# Patient Record
Sex: Female | Born: 1955
Health system: Southern US, Community
[De-identification: ages and names within clinical notes are randomized; demographics above are authoritative.]

## PROBLEM LIST (undated history)

## (undated) DIAGNOSIS — T4145XA Adverse effect of unspecified anesthetic, initial encounter: Secondary | ICD-10-CM

## (undated) DIAGNOSIS — F419 Anxiety disorder, unspecified: Secondary | ICD-10-CM

## (undated) DIAGNOSIS — F32A Depression, unspecified: Secondary | ICD-10-CM

## (undated) DIAGNOSIS — G4733 Obstructive sleep apnea (adult) (pediatric): Secondary | ICD-10-CM

## (undated) DIAGNOSIS — R7303 Prediabetes: Secondary | ICD-10-CM

## (undated) DIAGNOSIS — J45909 Unspecified asthma, uncomplicated: Secondary | ICD-10-CM

## (undated) DIAGNOSIS — G4730 Sleep apnea, unspecified: Secondary | ICD-10-CM

## (undated) DIAGNOSIS — N63 Unspecified lump in unspecified breast: Secondary | ICD-10-CM

## (undated) DIAGNOSIS — I10 Essential (primary) hypertension: Secondary | ICD-10-CM

## (undated) DIAGNOSIS — F329 Major depressive disorder, single episode, unspecified: Secondary | ICD-10-CM

## (undated) DIAGNOSIS — I5022 Chronic systolic (congestive) heart failure: Secondary | ICD-10-CM

## (undated) DIAGNOSIS — N189 Chronic kidney disease, unspecified: Secondary | ICD-10-CM

## (undated) DIAGNOSIS — M1990 Unspecified osteoarthritis, unspecified site: Secondary | ICD-10-CM

## (undated) DIAGNOSIS — M109 Gout, unspecified: Principal | ICD-10-CM

## (undated) DIAGNOSIS — E785 Hyperlipidemia, unspecified: Secondary | ICD-10-CM

## (undated) DIAGNOSIS — T8859XA Other complications of anesthesia, initial encounter: Secondary | ICD-10-CM

## (undated) DIAGNOSIS — E119 Type 2 diabetes mellitus without complications: Secondary | ICD-10-CM

## (undated) HISTORY — DX: Unspecified lump in unspecified breast: N63.0

## (undated) HISTORY — DX: Gout, unspecified: M10.9

## (undated) HISTORY — DX: Sleep apnea, unspecified: G47.30

---

## 1997-01-20 HISTORY — PX: ABDOMINAL HYSTERECTOMY: SHX81

## 1998-05-25 ENCOUNTER — Encounter: Payer: Self-pay | Admitting: Internal Medicine

## 1998-05-25 ENCOUNTER — Inpatient Hospital Stay (HOSPITAL_COMMUNITY): Admission: AD | Admit: 1998-05-25 | Discharge: 1998-05-27 | Payer: Self-pay | Admitting: Internal Medicine

## 1998-05-27 ENCOUNTER — Encounter: Payer: Self-pay | Admitting: Internal Medicine

## 2002-02-02 ENCOUNTER — Encounter: Admission: RE | Admit: 2002-02-02 | Discharge: 2002-02-02 | Payer: Self-pay | Admitting: Internal Medicine

## 2002-02-02 ENCOUNTER — Encounter: Payer: Self-pay | Admitting: Internal Medicine

## 2002-12-16 ENCOUNTER — Encounter: Admission: RE | Admit: 2002-12-16 | Discharge: 2002-12-16 | Payer: Self-pay | Admitting: Internal Medicine

## 2003-12-29 ENCOUNTER — Other Ambulatory Visit: Admission: RE | Admit: 2003-12-29 | Discharge: 2003-12-29 | Payer: Self-pay | Admitting: Obstetrics and Gynecology

## 2004-10-15 ENCOUNTER — Emergency Department (HOSPITAL_COMMUNITY): Admission: EM | Admit: 2004-10-15 | Discharge: 2004-10-15 | Payer: Self-pay | Admitting: Emergency Medicine

## 2006-05-25 ENCOUNTER — Ambulatory Visit: Payer: Self-pay | Admitting: Cardiology

## 2006-05-27 ENCOUNTER — Ambulatory Visit: Payer: Self-pay | Admitting: Cardiology

## 2006-08-09 ENCOUNTER — Ambulatory Visit (HOSPITAL_BASED_OUTPATIENT_CLINIC_OR_DEPARTMENT_OTHER): Admission: RE | Admit: 2006-08-09 | Discharge: 2006-08-09 | Payer: Self-pay | Admitting: Cardiology

## 2006-08-25 ENCOUNTER — Ambulatory Visit: Payer: Self-pay | Admitting: Pulmonary Disease

## 2007-12-02 ENCOUNTER — Ambulatory Visit: Payer: Self-pay | Admitting: Cardiology

## 2008-03-23 ENCOUNTER — Emergency Department (HOSPITAL_COMMUNITY): Admission: EM | Admit: 2008-03-23 | Discharge: 2008-03-23 | Payer: Self-pay | Admitting: Emergency Medicine

## 2008-04-19 ENCOUNTER — Ambulatory Visit: Payer: Self-pay

## 2008-12-22 ENCOUNTER — Encounter: Payer: Self-pay | Admitting: Cardiology

## 2009-02-20 ENCOUNTER — Ambulatory Visit: Payer: Self-pay | Admitting: Vascular Surgery

## 2009-02-20 ENCOUNTER — Emergency Department (HOSPITAL_COMMUNITY): Admission: EM | Admit: 2009-02-20 | Discharge: 2009-02-21 | Payer: Self-pay | Admitting: Emergency Medicine

## 2009-02-20 ENCOUNTER — Encounter (INDEPENDENT_AMBULATORY_CARE_PROVIDER_SITE_OTHER): Payer: Self-pay | Admitting: Emergency Medicine

## 2009-02-23 ENCOUNTER — Encounter (INDEPENDENT_AMBULATORY_CARE_PROVIDER_SITE_OTHER): Payer: Self-pay | Admitting: Internal Medicine

## 2009-02-23 ENCOUNTER — Inpatient Hospital Stay (HOSPITAL_COMMUNITY): Admission: EM | Admit: 2009-02-23 | Discharge: 2009-02-27 | Payer: Self-pay | Admitting: Emergency Medicine

## 2009-02-23 ENCOUNTER — Ambulatory Visit: Payer: Self-pay | Admitting: Vascular Surgery

## 2009-04-19 ENCOUNTER — Ambulatory Visit: Payer: Self-pay | Admitting: Cardiology

## 2009-04-19 ENCOUNTER — Ambulatory Visit: Payer: Self-pay | Admitting: Family Medicine

## 2009-04-19 ENCOUNTER — Inpatient Hospital Stay (HOSPITAL_COMMUNITY): Admission: EM | Admit: 2009-04-19 | Discharge: 2009-04-26 | Payer: Self-pay | Admitting: Emergency Medicine

## 2009-04-23 ENCOUNTER — Encounter: Payer: Self-pay | Admitting: Family Medicine

## 2009-05-12 ENCOUNTER — Observation Stay (HOSPITAL_COMMUNITY): Admission: EM | Admit: 2009-05-12 | Discharge: 2009-05-16 | Payer: Self-pay | Admitting: Emergency Medicine

## 2009-05-30 ENCOUNTER — Encounter: Admission: RE | Admit: 2009-05-30 | Discharge: 2009-05-30 | Payer: Self-pay | Admitting: Rheumatology

## 2010-02-11 ENCOUNTER — Encounter: Payer: Self-pay | Admitting: Sports Medicine

## 2010-04-09 LAB — CBC
HCT: 32.7 % — ABNORMAL LOW (ref 36.0–46.0)
HCT: 34.4 % — ABNORMAL LOW (ref 36.0–46.0)
HCT: 37 % (ref 36.0–46.0)
Hemoglobin: 11.3 g/dL — ABNORMAL LOW (ref 12.0–15.0)
Hemoglobin: 12.2 g/dL (ref 12.0–15.0)
MCHC: 32.8 g/dL (ref 30.0–36.0)
MCHC: 32.9 g/dL (ref 30.0–36.0)
MCHC: 33.1 g/dL (ref 30.0–36.0)
MCV: 84.4 fL (ref 78.0–100.0)
MCV: 84.5 fL (ref 78.0–100.0)
MCV: 84.6 fL (ref 78.0–100.0)
MCV: 85 fL (ref 78.0–100.0)
MCV: 85 fL (ref 78.0–100.0)
Platelets: 138 10*3/uL — ABNORMAL LOW (ref 150–400)
Platelets: 183 10*3/uL (ref 150–400)
Platelets: 192 10*3/uL (ref 150–400)
RBC: 3.87 MIL/uL (ref 3.87–5.11)
RBC: 4.07 MIL/uL (ref 3.87–5.11)
WBC: 12.6 10*3/uL — ABNORMAL HIGH (ref 4.0–10.5)
WBC: 13.6 10*3/uL — ABNORMAL HIGH (ref 4.0–10.5)
WBC: 18.4 10*3/uL — ABNORMAL HIGH (ref 4.0–10.5)

## 2010-04-09 LAB — GLUCOSE, CAPILLARY
Glucose-Capillary: 133 mg/dL — ABNORMAL HIGH (ref 70–99)
Glucose-Capillary: 144 mg/dL — ABNORMAL HIGH (ref 70–99)
Glucose-Capillary: 145 mg/dL — ABNORMAL HIGH (ref 70–99)
Glucose-Capillary: 150 mg/dL — ABNORMAL HIGH (ref 70–99)
Glucose-Capillary: 159 mg/dL — ABNORMAL HIGH (ref 70–99)
Glucose-Capillary: 179 mg/dL — ABNORMAL HIGH (ref 70–99)
Glucose-Capillary: 190 mg/dL — ABNORMAL HIGH (ref 70–99)
Glucose-Capillary: 222 mg/dL — ABNORMAL HIGH (ref 70–99)
Glucose-Capillary: 242 mg/dL — ABNORMAL HIGH (ref 70–99)

## 2010-04-09 LAB — COMPREHENSIVE METABOLIC PANEL
AST: 16 U/L (ref 0–37)
AST: 17 U/L (ref 0–37)
AST: 32 U/L (ref 0–37)
AST: 66 U/L — ABNORMAL HIGH (ref 0–37)
Albumin: 2.5 g/dL — ABNORMAL LOW (ref 3.5–5.2)
Albumin: 2.9 g/dL — ABNORMAL LOW (ref 3.5–5.2)
Alkaline Phosphatase: 82 U/L (ref 39–117)
BUN: 27 mg/dL — ABNORMAL HIGH (ref 6–23)
BUN: 45 mg/dL — ABNORMAL HIGH (ref 6–23)
CO2: 21 mEq/L (ref 19–32)
CO2: 22 mEq/L (ref 19–32)
Calcium: 9.1 mg/dL (ref 8.4–10.5)
Calcium: 9.2 mg/dL (ref 8.4–10.5)
Calcium: 9.4 mg/dL (ref 8.4–10.5)
Chloride: 107 mEq/L (ref 96–112)
Chloride: 109 mEq/L (ref 96–112)
Chloride: 112 mEq/L (ref 96–112)
Creatinine, Ser: 1.68 mg/dL — ABNORMAL HIGH (ref 0.4–1.2)
Creatinine, Ser: 1.79 mg/dL — ABNORMAL HIGH (ref 0.4–1.2)
Creatinine, Ser: 1.88 mg/dL — ABNORMAL HIGH (ref 0.4–1.2)
GFR calc Af Amer: 34 mL/min — ABNORMAL LOW (ref >60)
GFR calc Af Amer: 36 mL/min — ABNORMAL LOW (ref >60)
GFR calc Af Amer: 39 mL/min — ABNORMAL LOW (ref >60)
GFR calc Af Amer: 41 mL/min — ABNORMAL LOW (ref >60)
GFR calc non Af Amer: 32 mL/min — ABNORMAL LOW (ref >60)
Potassium: 4.9 mEq/L (ref 3.5–5.1)
Sodium: 139 mEq/L (ref 135–145)
Total Bilirubin: 0.7 mg/dL (ref 0.3–1.2)
Total Protein: 7.1 g/dL (ref 6.0–8.3)
Total Protein: 7.2 g/dL (ref 6.0–8.3)

## 2010-04-09 LAB — BASIC METABOLIC PANEL
Chloride: 110 mEq/L (ref 96–112)
Creatinine, Ser: 1.61 mg/dL — ABNORMAL HIGH (ref 0.4–1.2)
GFR calc non Af Amer: 34 mL/min — ABNORMAL LOW (ref >60)
Glucose, Bld: 178 mg/dL — ABNORMAL HIGH (ref 70–99)
Sodium: 140 mEq/L (ref 135–145)

## 2010-04-09 LAB — URINALYSIS, ROUTINE W REFLEX MICROSCOPIC
Bilirubin Urine: NEGATIVE
Glucose, UA: NEGATIVE mg/dL
Hgb urine dipstick: NEGATIVE
Ketones, ur: NEGATIVE mg/dL
Urobilinogen, UA: 1 mg/dL (ref 0.0–1.0)
pH: 5 (ref 5.0–8.0)

## 2010-04-09 LAB — BODY FLUID CULTURE: Culture: NO GROWTH

## 2010-04-09 LAB — DIFFERENTIAL
Basophils Absolute: 0 10*3/uL (ref 0.0–0.1)
Eosinophils Relative: 0 % (ref 0–5)
Eosinophils Relative: 0 % (ref 0–5)
Lymphocytes Relative: 11 % — ABNORMAL LOW (ref 12–46)
Lymphocytes Relative: 13 % (ref 12–46)
Lymphocytes Relative: 16 % (ref 12–46)
Lymphs Abs: 1.4 10*3/uL (ref 0.7–4.0)
Lymphs Abs: 1.5 10*3/uL (ref 0.7–4.0)
Lymphs Abs: 2.4 10*3/uL (ref 0.7–4.0)
Monocytes Absolute: 0.4 10*3/uL (ref 0.1–1.0)
Monocytes Relative: 3 % (ref 3–12)
Neutro Abs: 7.7 10*3/uL (ref 1.7–7.7)

## 2010-04-09 LAB — CARDIAC PANEL(CRET KIN+CKTOT+MB+TROPI)
CK, MB: 1.3 ng/mL (ref 0.3–4.0)
Relative Index: INVALID (ref 0.0–2.5)
Total CK: 23 U/L (ref 7–177)
Troponin I: 0.03 ng/mL (ref 0.00–0.06)

## 2010-04-09 LAB — SYNOVIAL CELL COUNT + DIFF, W/ CRYSTALS
Eosinophils-Synovial: 0 % (ref 0–1)
Lymphocytes-Synovial Fld: 0 % (ref 0–20)
Monocyte-Macrophage-Synovial Fluid: 10 % — ABNORMAL LOW (ref 50–90)
Neutrophil, Synovial: 90 % — ABNORMAL HIGH (ref 0–25)
WBC, Synovial: 22610 /mm3 — ABNORMAL HIGH (ref 0–200)

## 2010-04-09 LAB — HEMOGLOBIN A1C
Hgb A1c MFr Bld: 6.3 % — ABNORMAL HIGH (ref <5.7)
Mean Plasma Glucose: 134 mg/dL — ABNORMAL HIGH (ref <117)

## 2010-04-09 LAB — GLUCOSE, SEROUS FLUID: Glucose, Fluid: 81 mg/dL

## 2010-04-09 LAB — MAGNESIUM: Magnesium: 2.3 mg/dL (ref 1.5–2.5)

## 2010-04-10 LAB — CK
Total CK: 289 U/L — ABNORMAL HIGH (ref 7–177)
Total CK: 389 U/L — ABNORMAL HIGH (ref 7–177)
Total CK: 637 U/L — ABNORMAL HIGH (ref 7–177)

## 2010-04-10 LAB — GLUCOSE, CAPILLARY
Glucose-Capillary: 116 mg/dL — ABNORMAL HIGH (ref 70–99)
Glucose-Capillary: 117 mg/dL — ABNORMAL HIGH (ref 70–99)
Glucose-Capillary: 120 mg/dL — ABNORMAL HIGH (ref 70–99)
Glucose-Capillary: 122 mg/dL — ABNORMAL HIGH (ref 70–99)
Glucose-Capillary: 122 mg/dL — ABNORMAL HIGH (ref 70–99)
Glucose-Capillary: 124 mg/dL — ABNORMAL HIGH (ref 70–99)
Glucose-Capillary: 130 mg/dL — ABNORMAL HIGH (ref 70–99)
Glucose-Capillary: 133 mg/dL — ABNORMAL HIGH (ref 70–99)
Glucose-Capillary: 137 mg/dL — ABNORMAL HIGH (ref 70–99)
Glucose-Capillary: 137 mg/dL — ABNORMAL HIGH (ref 70–99)
Glucose-Capillary: 141 mg/dL — ABNORMAL HIGH (ref 70–99)
Glucose-Capillary: 143 mg/dL — ABNORMAL HIGH (ref 70–99)
Glucose-Capillary: 153 mg/dL — ABNORMAL HIGH (ref 70–99)
Glucose-Capillary: 173 mg/dL — ABNORMAL HIGH (ref 70–99)
Glucose-Capillary: 178 mg/dL — ABNORMAL HIGH (ref 70–99)
Glucose-Capillary: 181 mg/dL — ABNORMAL HIGH (ref 70–99)
Glucose-Capillary: 182 mg/dL — ABNORMAL HIGH (ref 70–99)
Glucose-Capillary: 185 mg/dL — ABNORMAL HIGH (ref 70–99)
Glucose-Capillary: 206 mg/dL — ABNORMAL HIGH (ref 70–99)
Glucose-Capillary: 99 mg/dL (ref 70–99)

## 2010-04-10 LAB — BASIC METABOLIC PANEL
BUN: 22 mg/dL (ref 6–23)
BUN: 25 mg/dL — ABNORMAL HIGH (ref 6–23)
BUN: 25 mg/dL — ABNORMAL HIGH (ref 6–23)
BUN: 27 mg/dL — ABNORMAL HIGH (ref 6–23)
CO2: 18 mEq/L — ABNORMAL LOW (ref 19–32)
CO2: 24 mEq/L (ref 19–32)
Calcium: 8.4 mg/dL (ref 8.4–10.5)
Calcium: 8.4 mg/dL (ref 8.4–10.5)
Calcium: 8.8 mg/dL (ref 8.4–10.5)
Chloride: 104 mEq/L (ref 96–112)
Chloride: 105 mEq/L (ref 96–112)
Chloride: 107 mEq/L (ref 96–112)
Creatinine, Ser: 1.11 mg/dL (ref 0.4–1.2)
Creatinine, Ser: 1.67 mg/dL — ABNORMAL HIGH (ref 0.4–1.2)
Creatinine, Ser: 1.91 mg/dL — ABNORMAL HIGH (ref 0.4–1.2)
GFR calc Af Amer: 34 mL/min — ABNORMAL LOW (ref >60)
GFR calc Af Amer: 36 mL/min — ABNORMAL LOW (ref >60)
GFR calc Af Amer: 39 mL/min — ABNORMAL LOW (ref >60)
GFR calc non Af Amer: 28 mL/min — ABNORMAL LOW (ref >60)
GFR calc non Af Amer: 28 mL/min — ABNORMAL LOW (ref >60)
GFR calc non Af Amer: 32 mL/min — ABNORMAL LOW (ref >60)
GFR calc non Af Amer: 32 mL/min — ABNORMAL LOW (ref >60)
Glucose, Bld: 77 mg/dL (ref 70–99)
Potassium: 3.3 mEq/L — ABNORMAL LOW (ref 3.5–5.1)
Potassium: 3.6 mEq/L (ref 3.5–5.1)
Potassium: 3.7 mEq/L (ref 3.5–5.1)
Sodium: 136 mEq/L (ref 135–145)
Sodium: 139 mEq/L (ref 135–145)
Sodium: 141 mEq/L (ref 135–145)

## 2010-04-10 LAB — CBC
HCT: 30.8 % — ABNORMAL LOW (ref 36.0–46.0)
HCT: 31 % — ABNORMAL LOW (ref 36.0–46.0)
HCT: 33.4 % — ABNORMAL LOW (ref 36.0–46.0)
HCT: 33.5 % — ABNORMAL LOW (ref 36.0–46.0)
HCT: 34.3 % — ABNORMAL LOW (ref 36.0–46.0)
HCT: 35.1 % — ABNORMAL LOW (ref 36.0–46.0)
HCT: 36.8 % (ref 36.0–46.0)
Hemoglobin: 10.3 g/dL — ABNORMAL LOW (ref 12.0–15.0)
Hemoglobin: 10.3 g/dL — ABNORMAL LOW (ref 12.0–15.0)
Hemoglobin: 11.4 g/dL — ABNORMAL LOW (ref 12.0–15.0)
Hemoglobin: 11.4 g/dL — ABNORMAL LOW (ref 12.0–15.0)
Hemoglobin: 11.4 g/dL — ABNORMAL LOW (ref 12.0–15.0)
Hemoglobin: 11.7 g/dL — ABNORMAL LOW (ref 12.0–15.0)
MCHC: 33.3 g/dL (ref 30.0–36.0)
MCHC: 33.8 g/dL (ref 30.0–36.0)
MCHC: 34.2 g/dL (ref 30.0–36.0)
MCV: 82.1 fL (ref 78.0–100.0)
MCV: 82.7 fL (ref 78.0–100.0)
MCV: 82.8 fL (ref 78.0–100.0)
MCV: 83.6 fL (ref 78.0–100.0)
MCV: 83.8 fL (ref 78.0–100.0)
MCV: 84.7 fL (ref 78.0–100.0)
Platelets: 176 10*3/uL (ref 150–400)
Platelets: 243 10*3/uL (ref 150–400)
Platelets: 250 10*3/uL (ref 150–400)
Platelets: 254 10*3/uL (ref 150–400)
Platelets: 71 10*3/uL — ABNORMAL LOW (ref 150–400)
RBC: 3.69 MIL/uL — ABNORMAL LOW (ref 3.87–5.11)
RBC: 3.69 MIL/uL — ABNORMAL LOW (ref 3.87–5.11)
RBC: 4.1 MIL/uL (ref 3.87–5.11)
RBC: 4.24 MIL/uL (ref 3.87–5.11)
WBC: 10.3 10*3/uL (ref 4.0–10.5)
WBC: 12.2 10*3/uL — ABNORMAL HIGH (ref 4.0–10.5)
WBC: 19.7 10*3/uL — ABNORMAL HIGH (ref 4.0–10.5)
WBC: 19.9 10*3/uL — ABNORMAL HIGH (ref 4.0–10.5)
WBC: 3.8 10*3/uL — ABNORMAL LOW (ref 4.0–10.5)
WBC: 5.2 10*3/uL (ref 4.0–10.5)
WBC: 7.8 10*3/uL (ref 4.0–10.5)
WBC: 9.6 10*3/uL (ref 4.0–10.5)

## 2010-04-10 LAB — CARDIAC PANEL(CRET KIN+CKTOT+MB+TROPI)
CK, MB: 1.2 ng/mL (ref 0.3–4.0)
Relative Index: 0.5 (ref 0.0–2.5)
Relative Index: INVALID (ref 0.0–2.5)
Relative Index: INVALID (ref 0.0–2.5)
Total CK: 30 U/L (ref 7–177)
Total CK: 36 U/L (ref 7–177)
Troponin I: 0.03 ng/mL (ref 0.00–0.06)
Troponin I: 0.05 ng/mL (ref 0.00–0.06)
Troponin I: 0.06 ng/mL (ref 0.00–0.06)
Troponin I: 0.17 ng/mL — ABNORMAL HIGH (ref 0.00–0.06)

## 2010-04-10 LAB — HEPATITIS C ANTIBODY: HCV Ab: NEGATIVE

## 2010-04-10 LAB — COMPREHENSIVE METABOLIC PANEL
ALT: 173 U/L — ABNORMAL HIGH (ref 0–35)
ALT: 74 U/L — ABNORMAL HIGH (ref 0–35)
ALT: 92 U/L — ABNORMAL HIGH (ref 0–35)
AST: 24 U/L (ref 0–37)
AST: 43 U/L — ABNORMAL HIGH (ref 0–37)
AST: 79 U/L — ABNORMAL HIGH (ref 0–37)
Albumin: 2.3 g/dL — ABNORMAL LOW (ref 3.5–5.2)
Albumin: 2.3 g/dL — ABNORMAL LOW (ref 3.5–5.2)
Albumin: 2.3 g/dL — ABNORMAL LOW (ref 3.5–5.2)
Albumin: 2.6 g/dL — ABNORMAL LOW (ref 3.5–5.2)
Alkaline Phosphatase: 56 U/L (ref 39–117)
BUN: 25 mg/dL — ABNORMAL HIGH (ref 6–23)
BUN: 28 mg/dL — ABNORMAL HIGH (ref 6–23)
BUN: 28 mg/dL — ABNORMAL HIGH (ref 6–23)
BUN: 37 mg/dL — ABNORMAL HIGH (ref 6–23)
CO2: 21 mEq/L (ref 19–32)
CO2: 23 mEq/L (ref 19–32)
CO2: 23 mEq/L (ref 19–32)
CO2: 24 mEq/L (ref 19–32)
Calcium: 7.3 mg/dL — ABNORMAL LOW (ref 8.4–10.5)
Calcium: 8.6 mg/dL (ref 8.4–10.5)
Calcium: 8.8 mg/dL (ref 8.4–10.5)
Chloride: 105 mEq/L (ref 96–112)
Chloride: 106 mEq/L (ref 96–112)
Chloride: 110 mEq/L (ref 96–112)
Chloride: 111 mEq/L (ref 96–112)
Creatinine, Ser: 0.92 mg/dL (ref 0.4–1.2)
Creatinine, Ser: 1.02 mg/dL (ref 0.4–1.2)
Creatinine, Ser: 1.14 mg/dL (ref 0.4–1.2)
Creatinine, Ser: 1.38 mg/dL — ABNORMAL HIGH (ref 0.4–1.2)
Creatinine, Ser: 2.36 mg/dL — ABNORMAL HIGH (ref 0.4–1.2)
Creatinine, Ser: 2.47 mg/dL — ABNORMAL HIGH (ref 0.4–1.2)
GFR calc Af Amer: 49 mL/min — ABNORMAL LOW (ref >60)
GFR calc Af Amer: >60 mL/min (ref >60)
GFR calc Af Amer: >60 mL/min (ref >60)
GFR calc non Af Amer: 21 mL/min — ABNORMAL LOW (ref >60)
GFR calc non Af Amer: 22 mL/min — ABNORMAL LOW (ref >60)
GFR calc non Af Amer: 57 mL/min — ABNORMAL LOW (ref >60)
GFR calc non Af Amer: >60 mL/min (ref >60)
Glucose, Bld: 118 mg/dL — ABNORMAL HIGH (ref 70–99)
Glucose, Bld: 122 mg/dL — ABNORMAL HIGH (ref 70–99)
Glucose, Bld: 183 mg/dL — ABNORMAL HIGH (ref 70–99)
Potassium: 3.4 mEq/L — ABNORMAL LOW (ref 3.5–5.1)
Sodium: 139 mEq/L (ref 135–145)
Sodium: 140 mEq/L (ref 135–145)
Total Bilirubin: 0.6 mg/dL (ref 0.3–1.2)
Total Bilirubin: 0.7 mg/dL (ref 0.3–1.2)
Total Bilirubin: 1.3 mg/dL — ABNORMAL HIGH (ref 0.3–1.2)
Total Bilirubin: 2.3 mg/dL — ABNORMAL HIGH (ref 0.3–1.2)
Total Protein: 5.5 g/dL — ABNORMAL LOW (ref 6.0–8.3)
Total Protein: 5.7 g/dL — ABNORMAL LOW (ref 6.0–8.3)

## 2010-04-10 LAB — DIFFERENTIAL
Basophils Absolute: 0 10*3/uL (ref 0.0–0.1)
Basophils Absolute: 0 10*3/uL (ref 0.0–0.1)
Basophils Relative: 0 % (ref 0–1)
Basophils Relative: 0 % (ref 0–1)
Eosinophils Absolute: 0 10*3/uL (ref 0.0–0.7)
Eosinophils Relative: 0 % (ref 0–5)
Eosinophils Relative: 1 % (ref 0–5)
Lymphocytes Relative: 10 % — ABNORMAL LOW (ref 12–46)
Lymphocytes Relative: 8 % — ABNORMAL LOW (ref 12–46)
Monocytes Absolute: 0.3 10*3/uL (ref 0.1–1.0)
Monocytes Absolute: 0.7 10*3/uL (ref 0.1–1.0)
Neutro Abs: 3.1 10*3/uL (ref 1.7–7.7)

## 2010-04-10 LAB — BLOOD GAS, ARTERIAL
Acid-base deficit: 4.7 mmol/L — ABNORMAL HIGH (ref 0.0–2.0)
O2 Content: 3.5 L/min
Patient temperature: 104
TCO2: 19.7 mmol/L (ref 0–100)
pCO2 arterial: 32.9 mmHg — ABNORMAL LOW (ref 35.0–45.0)
pH, Arterial: 7.391 (ref 7.350–7.400)

## 2010-04-10 LAB — URINE CULTURE
Colony Count: NO GROWTH
Culture: NO GROWTH

## 2010-04-10 LAB — URINALYSIS, ROUTINE W REFLEX MICROSCOPIC
Bilirubin Urine: NEGATIVE
Ketones, ur: NEGATIVE mg/dL
Nitrite: NEGATIVE
Protein, ur: NEGATIVE mg/dL
Urobilinogen, UA: 1 mg/dL (ref 0.0–1.0)

## 2010-04-10 LAB — CULTURE, BLOOD (ROUTINE X 2): Culture: NO GROWTH

## 2010-04-10 LAB — HEPARIN LEVEL (UNFRACTIONATED)
Heparin Unfractionated: 0.17 IU/mL — ABNORMAL LOW (ref 0.30–0.70)
Heparin Unfractionated: 0.35 IU/mL (ref 0.30–0.70)

## 2010-04-10 LAB — LIPID PANEL
Cholesterol: 191 mg/dL (ref 0–200)
HDL: 23 mg/dL — ABNORMAL LOW (ref >39)

## 2010-04-10 LAB — CLOSTRIDIUM DIFFICILE EIA
C difficile Toxins A+B, EIA: NEGATIVE
C difficile Toxins A+B, EIA: NEGATIVE

## 2010-04-10 LAB — URIC ACID: Uric Acid, Serum: 14.3 mg/dL (ref 2.4–7.0)

## 2010-04-10 LAB — HEMOCCULT GUIAC POC 1CARD (OFFICE): Fecal Occult Bld: NEGATIVE

## 2010-04-10 LAB — DIC (DISSEMINATED INTRAVASCULAR COAGULATION)PANEL
D-Dimer, Quant: 2.6 ug/mL-FEU — ABNORMAL HIGH (ref 0.00–0.48)
Fibrinogen: >800 mg/dL — ABNORMAL HIGH (ref 204–475)
Platelets: 55 10*3/uL — ABNORMAL LOW (ref 150–400)
Prothrombin Time: 15.9 seconds — ABNORMAL HIGH (ref 11.6–15.2)
Smear Review: NONE SEEN

## 2010-04-10 LAB — HEPATITIS B SURFACE ANTIGEN: Hepatitis B Surface Ag: NEGATIVE

## 2010-04-10 LAB — HEMOGLOBIN A1C
Hgb A1c MFr Bld: 6.8 % — ABNORMAL HIGH (ref 4.6–6.1)
Mean Plasma Glucose: 157 mg/dL

## 2010-04-10 LAB — GRAM STAIN

## 2010-04-10 LAB — HEPATITIS B SURFACE ANTIBODY,QUALITATIVE: Hep B S Ab: NEGATIVE

## 2010-04-11 LAB — POCT CARDIAC MARKERS
CKMB, poc: 7.3 ng/mL (ref 1.0–8.0)
Myoglobin, poc: >500 ng/mL (ref 12–200)

## 2010-04-11 LAB — URINALYSIS, ROUTINE W REFLEX MICROSCOPIC
Nitrite: POSITIVE — AB
Specific Gravity, Urine: 1.038 — ABNORMAL HIGH (ref 1.005–1.030)
pH: 5 (ref 5.0–8.0)

## 2010-04-11 LAB — POCT I-STAT, CHEM 8
HCT: 40 % (ref 36.0–46.0)
Hemoglobin: 13.6 g/dL (ref 12.0–15.0)
Potassium: 3.5 mEq/L (ref 3.5–5.1)
Sodium: 143 mEq/L (ref 135–145)

## 2010-04-11 LAB — URINE MICROSCOPIC-ADD ON

## 2010-04-11 LAB — URIC ACID: Uric Acid, Serum: 14 mg/dL (ref 2.4–7.0)

## 2010-04-11 LAB — CK TOTAL AND CKMB (NOT AT ARMC): Relative Index: 0.7 (ref 0.0–2.5)

## 2010-04-11 LAB — URINE CULTURE: Culture: NO GROWTH

## 2010-04-15 LAB — DIFFERENTIAL
Band Neutrophils: 0 % (ref 0–10)
Blasts: 0 %
Eosinophils Absolute: 0 10*3/uL (ref 0.0–0.7)
Eosinophils Relative: 0 % (ref 0–5)
Metamyelocytes Relative: 0 %
Myelocytes: 0 %
WBC Morphology: INCREASED
nRBC: 0 /100 WBC

## 2010-04-15 LAB — RAPID URINE DRUG SCREEN, HOSP PERFORMED
Amphetamines: NOT DETECTED
Benzodiazepines: NOT DETECTED
Tetrahydrocannabinol: NOT DETECTED

## 2010-04-15 LAB — CBC
Hemoglobin: 10.9 g/dL — ABNORMAL LOW (ref 12.0–15.0)
MCHC: 33.3 g/dL (ref 30.0–36.0)
RBC: 3.94 MIL/uL (ref 3.87–5.11)
RBC: 4.32 MIL/uL (ref 3.87–5.11)
RDW: 16 % — ABNORMAL HIGH (ref 11.5–15.5)
WBC: 6.2 10*3/uL (ref 4.0–10.5)

## 2010-04-15 LAB — CULTURE, BLOOD (ROUTINE X 2)

## 2010-04-15 LAB — CARDIAC PANEL(CRET KIN+CKTOT+MB+TROPI)
CK, MB: 1.2 ng/mL (ref 0.3–4.0)
Relative Index: 0.5 (ref 0.0–2.5)
Total CK: 259 U/L — ABNORMAL HIGH (ref 7–177)

## 2010-04-15 LAB — URINALYSIS, ROUTINE W REFLEX MICROSCOPIC
Nitrite: POSITIVE — AB
Specific Gravity, Urine: 1.013 (ref 1.005–1.030)
Urobilinogen, UA: 1 mg/dL (ref 0.0–1.0)

## 2010-04-15 LAB — URINE MICROSCOPIC-ADD ON

## 2010-04-15 LAB — URINE CULTURE

## 2010-04-15 LAB — COMPREHENSIVE METABOLIC PANEL
ALT: 32 U/L (ref 0–35)
AST: 29 U/L (ref 0–37)
Albumin: 2.9 g/dL — ABNORMAL LOW (ref 3.5–5.2)
CO2: 25 mEq/L (ref 19–32)
Chloride: 99 mEq/L (ref 96–112)
GFR calc Af Amer: 25 mL/min — ABNORMAL LOW (ref >60)
GFR calc non Af Amer: 21 mL/min — ABNORMAL LOW (ref >60)
Potassium: 3.6 mEq/L (ref 3.5–5.1)
Sodium: 135 mEq/L (ref 135–145)
Total Bilirubin: 2.2 mg/dL — ABNORMAL HIGH (ref 0.3–1.2)

## 2010-04-15 LAB — BASIC METABOLIC PANEL
CO2: 25 mEq/L (ref 19–32)
GFR calc Af Amer: 26 mL/min — ABNORMAL LOW (ref >60)
GFR calc non Af Amer: 21 mL/min — ABNORMAL LOW (ref >60)
Glucose, Bld: 171 mg/dL — ABNORMAL HIGH (ref 70–99)
Potassium: 3.5 mEq/L (ref 3.5–5.1)
Sodium: 136 mEq/L (ref 135–145)

## 2010-04-15 LAB — LACTIC ACID, PLASMA: Lactic Acid, Venous: 2.2 mmol/L (ref 0.5–2.2)

## 2010-04-15 LAB — CK TOTAL AND CKMB (NOT AT ARMC)
CK, MB: 0.9 ng/mL (ref 0.3–4.0)
Relative Index: 0.3 (ref 0.0–2.5)
Total CK: 258 U/L — ABNORMAL HIGH (ref 7–177)

## 2010-04-15 LAB — LIPASE, BLOOD: Lipase: 15 U/L (ref 11–59)

## 2010-06-04 NOTE — Procedures (Signed)
Chelsea Roberts, Chelsea Roberts            ACCOUNT NO.:  [id number]   MEDICAL RECORD NO.:  [id number]          PATIENT TYPE:  OUT   LOCATION:  SLEEP CENTER                 FACILITY:  [facility]   PHYSICIAN:  Kathee Delton, MD,FCCPDATE OF BIRTH:  1955/08/16   DATE OF STUDY:  08/09/2006                            NOCTURNAL POLYSOMNOGRAM   REFERRING PHYSICIAN:  Minus Breeding, MD, [facility]   INDICATION FOR STUDY:  Hypersomnia or sleep apnea.   EPWORTH SLEEPINESS SCORE:  2.   MEDICATIONS:   SLEEP ARCHITECTURE:  Patient had a total sleep time of 355 minutes with  adequate slow-wave sleep for her age and only slightly decreased REM.  Sleep onset latency was normal and REM onset was prolonged.  Sleep  efficiency was mildly decreased at 87%.   RESPIRATORY DATA:  The patient underwent split-night protocol where she  was found to have 178 obstructive events in the first 114 minutes of  sleep.  This gave her an extrapolated apnea-hypopnea index of 94 events  per hour during the first half of the night.  The patient had very loud  snoring documented during this time.  By protocol, she was placed on a  medium Mirage Quattro full face mask and titrated to a final pressure of  11 cm of water for treatment of all of her apneas, even through REM.  The pressure was increased incrementally to a final pressure of 14 cm of  water pressure to eliminate snoring thereafter.   OXYGEN DATA:  The patient had O2 desaturation as low as 78% with her  obstructive events.  She maintained excellent O2 saturations after  optimal CPAP.   CARDIAC DATA:  Occasional PVC's but no clinically significant cardiac  arrhythmias were noted.   MOVEMENT-PARASOMNIA:  Small numbers of leg jerks without clinical  significance.   IMPRESSIONS-RECOMMENDATIONS:  1. Split-night study reveals severe obstructive sleep apnea with an      apnea-hypopnea index during the first half of the night of 94      events per hour and O2 desaturation as  low as 78%.  Patient was      then placed on a medium Quattro full face mask and titrated to 11      cm of water for treatment of her apneas and 14 cm for snoring after      that.  Optimal pressure would be somewhere between the 11 and 14.      Patient should also be encouraged to      work aggressively on weight loss.  2. Occasional PVC's but no clinically significant arrhythmias were      noted.      Kathee Delton, MD,FCCP  Diplomate, [place] Board of Sleep  Medicine  Electronically Signed     [facility]/MEDQ  D:  08/25/2006 17:19:01  T:  08/26/2006 09:11:02  Job:  [id number]

## 2010-06-07 NOTE — Assessment & Plan Note (Signed)
[place] OFFICE NOTE   Chelsea Roberts, Chelsea Roberts                     MRN:          [id number]  DATE:12/04/2007                            DOB:          [birth year]    PRIMARY CARE PHYSICIAN:  Chelsea Glad L. Darron Doom, MD   REASON FOR PRESENTATION:  Evaluate the patient with shortness of breath  and left ventricular hypertrophy.   HISTORY OF PRESENT ILLNESS:  The patient returns for the first time  since 2008.  At that time, I saw her for management of hypertension.  I  saw her blood pressure was reasonably well-controlled at the last nurse  appointment after we suggested some meds.  Her blood pressure reading  was 120/89 with heart rate of 82 the last time we saw her.  However, she  tells me that it has not been well-controlled until she recently started  working with Dr. Darron Roberts.  She has had her meds adjusted and only  recently has had reasonable blood pressure control.  However, Dr.  Darron Roberts, was concerned about progressive renal insufficiency as well as  apparent left ventricular hypertrophy by EKG.  The patient did have  evidence of left ventricular hypertrophy on echocardiogram at  [facility] in 2008, though she had a well-preserved ejection fraction.   The patient has gained weight steadily since I last saw her.  She has  not been exercising routinely.  She does get short of breath with  walking.  She says this is progressive.  She has to walk quite a ways  into her office at work.  She gets dyspneic and have to stop.  She is to  climb stairs and have to stop.  She is not short of breath at night or  sleeping in the bed.  She is not having any PND or orthopnea.  She does  have chest discomfort with emotional stress, but now with physical  exertion.  This has been stable pattern.  She is very limited by gout  flares and this happened apparently frequently.  This reduces her  ability to exercise as much she  should.  The patient was to be referred  to a nephrologist, though her creatinine has improved over recent  readings.   PAST MEDICAL HISTORY:  1. Hypertension.  2. Renal insufficiency.  3. Obesity.  4. Hypothyroidism.  5. Sleep apnea treated with CPAP.  6. Chest discomfort (negative stress perfusion study in 2000).  7. Left ventricular hypertrophy.  8. Hysterectomy.   ALLERGIES:  PROCARDIA, SULAR, and SHELLFISH.   MEDICATIONS:  1. Benicar 80 mg daily.  2. Coreg 25 mg b.i.d.  3. Lasix 40 mg daily.  4. Spironolactone 50 mg daily.  5. Zocor 40 mg.  6. Aspirin 81 mg daily.  7. Ativan.   REVIEW OF SYSTEMS:  As stated in the HPI and otherwise negative for all  other systems.   PHYSICAL EXAMINATION:  GENERAL:  The patient is a pleasant and in no  distress.  VITAL SIGNS:  Blood pressure 138/94, heart rate 80 and regular, and  weight 270 pounds.  HEENT:  Eyes  are unremarkable, pupils equal, round, and reactive to  light, fundi not visualized, oral mucosa unremarkable.  NECK:  No jugular venous distention at 45 degrees, carotid upstroke  brisk and symmetrical.  No bruits, no thyromegaly.  LYMPHATICS:  No cervical, axillary, or inguinal adenopathy.  LUNGS:  Clear to auscultation bilaterally.  BACK:  No costovertebral angle tenderness.  CHEST:  Unremarkable.  HEART:  PMI not displaced or sustained, S1 and S2 within normal.  No S3,  no S4, no clicks, no rubs, no murmurs.  ABDOMEN:  Morbidly obese, positive bowel sounds normal in frequency and  pitch, no bruits, no rebound, no guarding, no midline pulsatile mass, no  hepatomegaly, no splenomegaly.  SKIN:  No rashes, no nodules.  EXTREMITIES:  2+ pulses throughout, no edema, no cyanosis, no clubbing.  NEUROLOGIC:  Oriented to person, place, and time, cranial nerves II-XII  grossly intact, motor grossly intact.   EKG ([facility]) sinus rhythm, rate 60, poor anterior R-wave  progression, no acute ST-T wave changes.    ASSESSMENT AND PLAN:  1. Dyspnea.  The patient has had progressive dyspnea in this system is      the most concerning complaint.  This may be multifactorial.  She is      morbidly obese.  She deconditioned.  She clearly has some element      of diastolic dysfunction.  I am going to check a BMP level and also      repeat an echocardiogram to see what her systolic function is and      look at left ventricular hypertrophy as well.  For now, given her      renal insufficiency and has had to go up on any diuretics.  Rather      she needs salt restriction.  She needs aggressive attention to      weight loss.  We could then further address her dyspnea if it      worsens.  2. Sleep apnea.  She is wearing her continuous positive airway      pressure sometimes.  She needs to wear this routinely.  She needs      weight loss.  3. Morbid obesity.  We discussed this is a very significant problem.      I have was prescribed for her the [employer] and hope that      she could comply.  4. Hypertension.  Blood pressure actually seems to be very well-      controlled under the careful watch of Dr. Darron Roberts and she should      continue with this.  5. Renal insufficiency.  She is due to see Dr. Hassell Roberts.  This is almost      definitely related to the hypertension.  However, I will defer      further workup to Dr. Hassell Roberts.  As I looked through my old paper chart,      I do not see any evaluation of her renal arteries either in this      office or when she was seeing [place].  I will fax results of      the 24-hour metanephrine and VMA to Dr. Hassell Roberts.  6. Dyslipidemia per Dr. Darron Roberts.  7. Followup.  I will see her back after the results of the      echocardiogram and BMP level.     Chelsea Breeding, MD, [facility]  Electronically Signed    JH/MedQ  DD: 12/03/2007  DT: 12/04/2007  Job #:  [id number]   cc:   Chelsea Roberts, M.D.  Chelsea Roberts, M.D.

## 2010-06-07 NOTE — Assessment & Plan Note (Signed)
[place] OFFICE NOTE   ROBBI, RAINEY                     MRN:          [id number]  DATE:05/25/2006                            DOB:          [birth year]    The primary is Dr. Horald Pollen.   REASON FOR PRESENTATION:  Evaluate patient with difficult to control  hypertension.   HISTORY OF PRESENT ILLNESS:  The patient is a lovely 55 year old African-  American female with long-standing hypertension. She was diagnosed in  1996. For a long time, she was managed with Benicar, Toprol, Lasix and  clonidine at night with reasonable control. However, about a year ago,  she started having increasing blood pressures. She takes it at home. She  has now been having frequent systolic blood pressures above [id number] and  diastolics above [id number]. She has switched her primary care to Dr. Darron Doom.  She has been started on spironolactone without improvement. There has  been some workup. The patient does describe a 24-hour urine some years  ago. Dr. Darron Doom and I recently discussed her care, and she has  aldosterone and renin levels pending. She also was seen at [facility].  Earlier this year, she had an echocardiogram which demonstrated moderate  concentric left ventricular hypertrophy with an EF of greater than 55%.  There was also apparently a renal ultrasound, though I do not have these  results.   The patient says that she has fatigue. She is able to get up and go to  work and get through her day. She is tired frequently. This has been a  chronic problem without any acute change. She does get headaches when  her blood pressure goes up. She gets short of breath climbing stairs,  and this has again been a chronic problem. She does not have any  shortness of breath at rest. Denies any PND or orthopnea. She does not  describe any palpitations, presyncope or syncope. She will occasionally  get a sharp burning discomfort in  her chest, but this is not with  activity. This has been sporadic and kind of a chronic issue without any  change in her pattern.   PAST MEDICAL HISTORY:  The patient did have an episode of chest  discomfort in 2000 and was diagnosed with anxiety after a negative  stress perfusion study, hypertension, obesity, hypothyroidism (recently  treated).   PAST SURGICAL HISTORY:  Hysterectomy.   ALLERGIES:  No medication allergies.   MEDICATIONS:  1. Benicar 40 mg daily.  2. Spironolactone 50 mg daily.  3. Lisinopril 20 mg b.i.d.  4. Toprol 100 mg daily.  5. Colchicine.  6. Ativan.  7. Clonidine 0.1 mg daily.  8. Zyrtec.  9. Crestor.  10.Synthroid 25 mcg daily.   SOCIAL HISTORY:  The patient is divorced. She has no children. 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 She has never smoked  cigarettes and does not drink alcohol.   FAMILY HISTORY:  Is contributory for sisters with difficult to control  hypertension.   REVIEW OF SYSTEMS:  Positive for ear infections, hot flashes, gout,  60-  pound weight gain in the past year and a half. Negative for other  systems.   PHYSICAL EXAMINATION:  The patient is in no distress.  Her blood  pressure was 240/120 when I took it with a large cuff, heart rate 62 and  regular.  HEENT:  Eyelids unremarkable; pupils equal, round, and reactive to  light; fundi difficult to evaluate, but I did not see any papilledema,  AV nicking or copper wiring. Oral mucosal was unremarkable.  NECK:  No jugular venous distention at 45 degrees. Carotid upstrokes  brisk and symmetrical. No bruits. No thyromegaly.  LYMPHATICS:  No cervical, axillary, or inguinal adenopathy.  LUNGS:  Clear to auscultation bilaterally.  BACK:  No costovertebral angle tenderness.  CHEST:  Unremarkable.  HEART:  PMI not displaced or sustained. S1 and S2 within normal limits.  No S3, no s4, no clicks, no rubs, no murmurs.  ABDOMEN:  Morbidly obese.  Positive bowel  sounds, normal in frequency  and pitch.  No bruits, no rebound, no guarding, no midline pulsatile  mass, no mass, no hepatomegaly, no splenomegaly.  SKIN:  No rashes, no nodules.  EXTREMITIES:  2+ pulses throughout, no edema, no cyanosis, no clubbing.  NEUROLOGICAL:  Oriented to person, place, and time; cranial nerves II-  XII grossly intact; motor grossly intact.   EKG:  (May 21, 2006 by Dr. Darron Doom) normal sinus rhythm, axis within  normal limits, intervals within normal limits, poor anterior R-wave  progression, nonspecific lateral T-wave inversions.   ASSESSMENT AND PLAN:  1. Hypertension. I did give the patient 0.2 mg of clonidine and      watched her in the office for about a half an hour. Her blood      pressure came down to 190/100. She was not having any symptoms.      Again, she had no physical findings of malignant hypertension. We      talked about her medications. She has not tolerated increased      clonidine in the past as it caused fatigue. She has had a workup,      and currently renin and aldosterone levels are pending. I will      check to see if she has had a cortisol level. She had a 24-hour      urine for VMA and metanephrine years ago, and I will repeat this.      She will need a workup for sleep apnea. She does not know whether      she snores, but she lives alone. In the meantime, I am going to      change her to labetalol 200 mg b.i.d. She will stop the Toprol. She      will continue other medications as listed. I will see her back in 2      days for blood pressure measurement.  2. Obesity. This is certainly going to be part of her blood pressure      management. We will need to be aggressive in trying to counsel her      about the need to loose weight.  3. Followup. I will see her back in 2 days. We will make further      medication adjustments as needed.     Minus Breeding, MD, [facility]    JH/MedQ  DD: 05/25/2006  DT: 05/26/2006  Job #: [id number]   cc:    Santiago Glad L. Darron Doom, M.D.

## 2010-12-17 IMAGING — CR DG KNEE COMPLETE 4+V*R*
4 series · 4 of 4 positions shown · non-contrast
Comparison: 02/23/2009

CLINICAL DATA: Knee pain

RIGHT KNEE - COMPLETE 4+ VIEW

[view not recorded (1 of 4)]
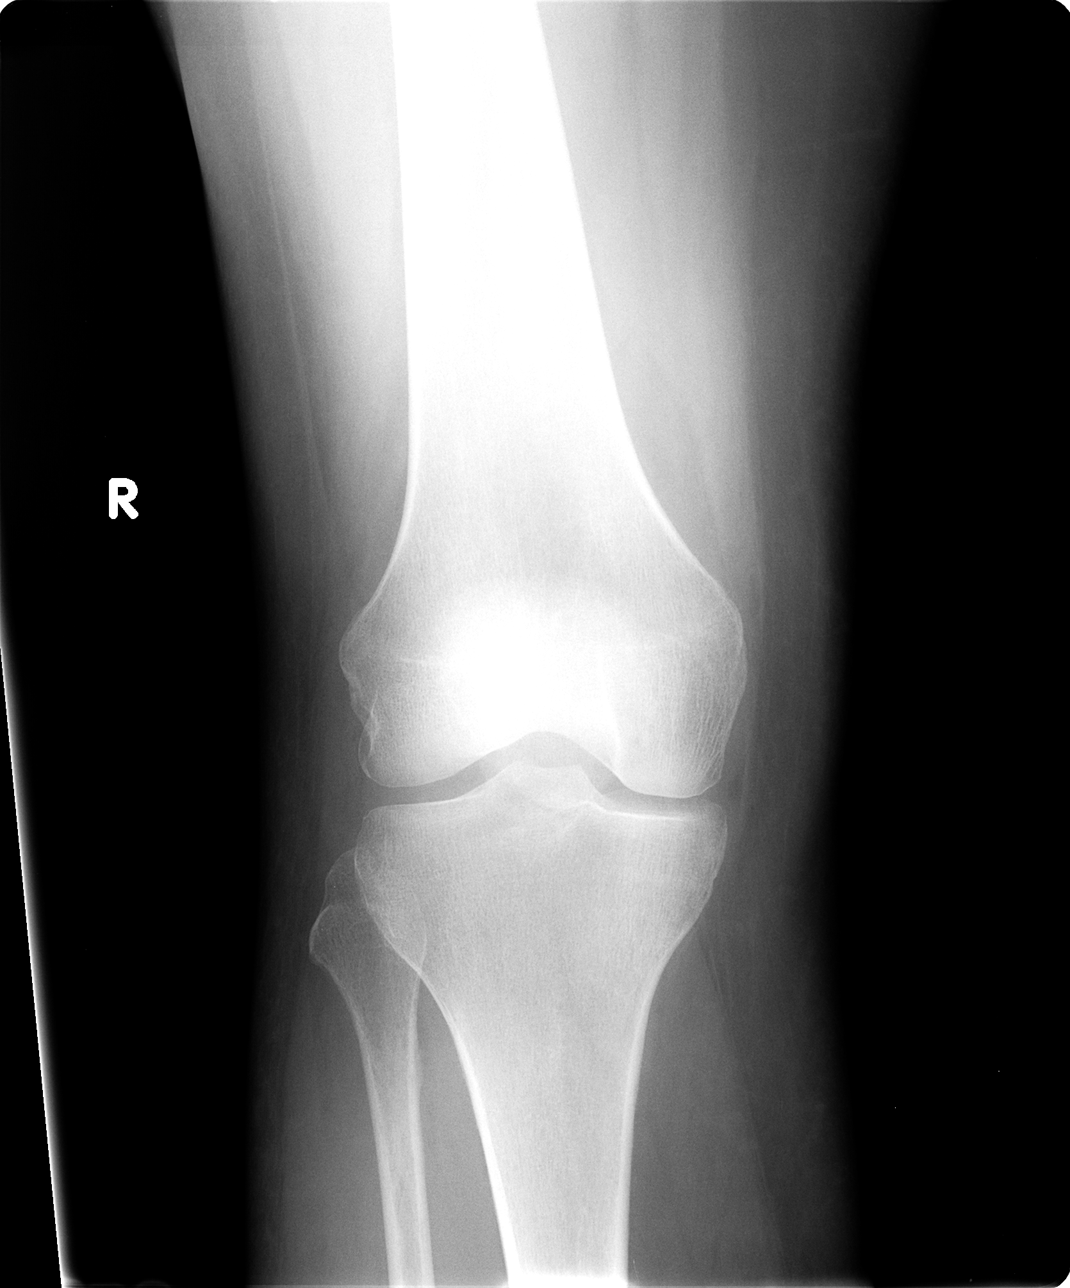

[view not recorded (2 of 4)]
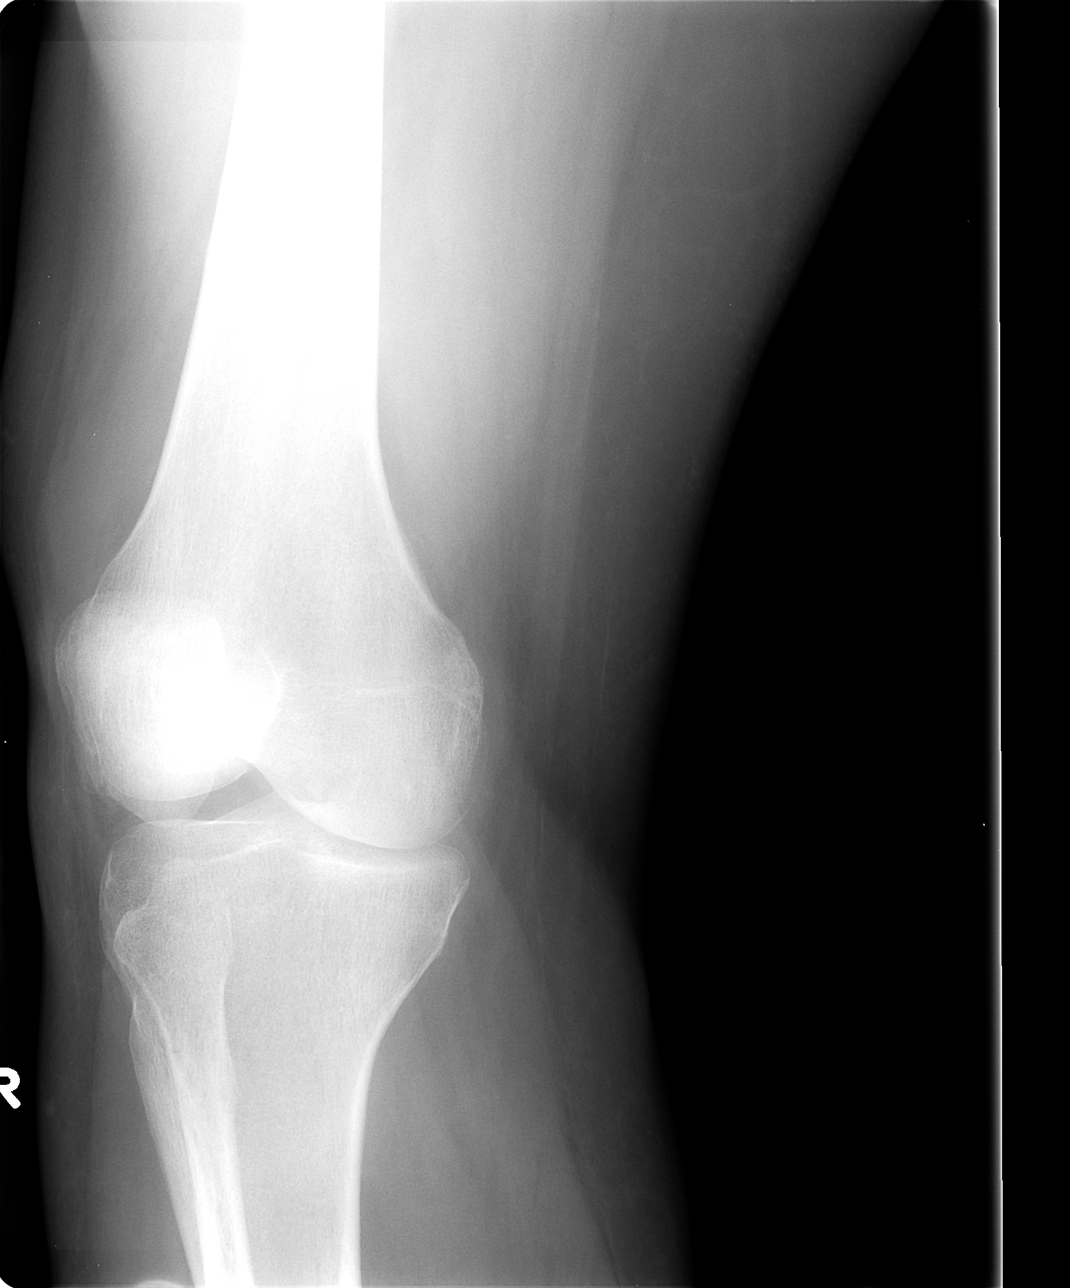

[view not recorded (3 of 4)]
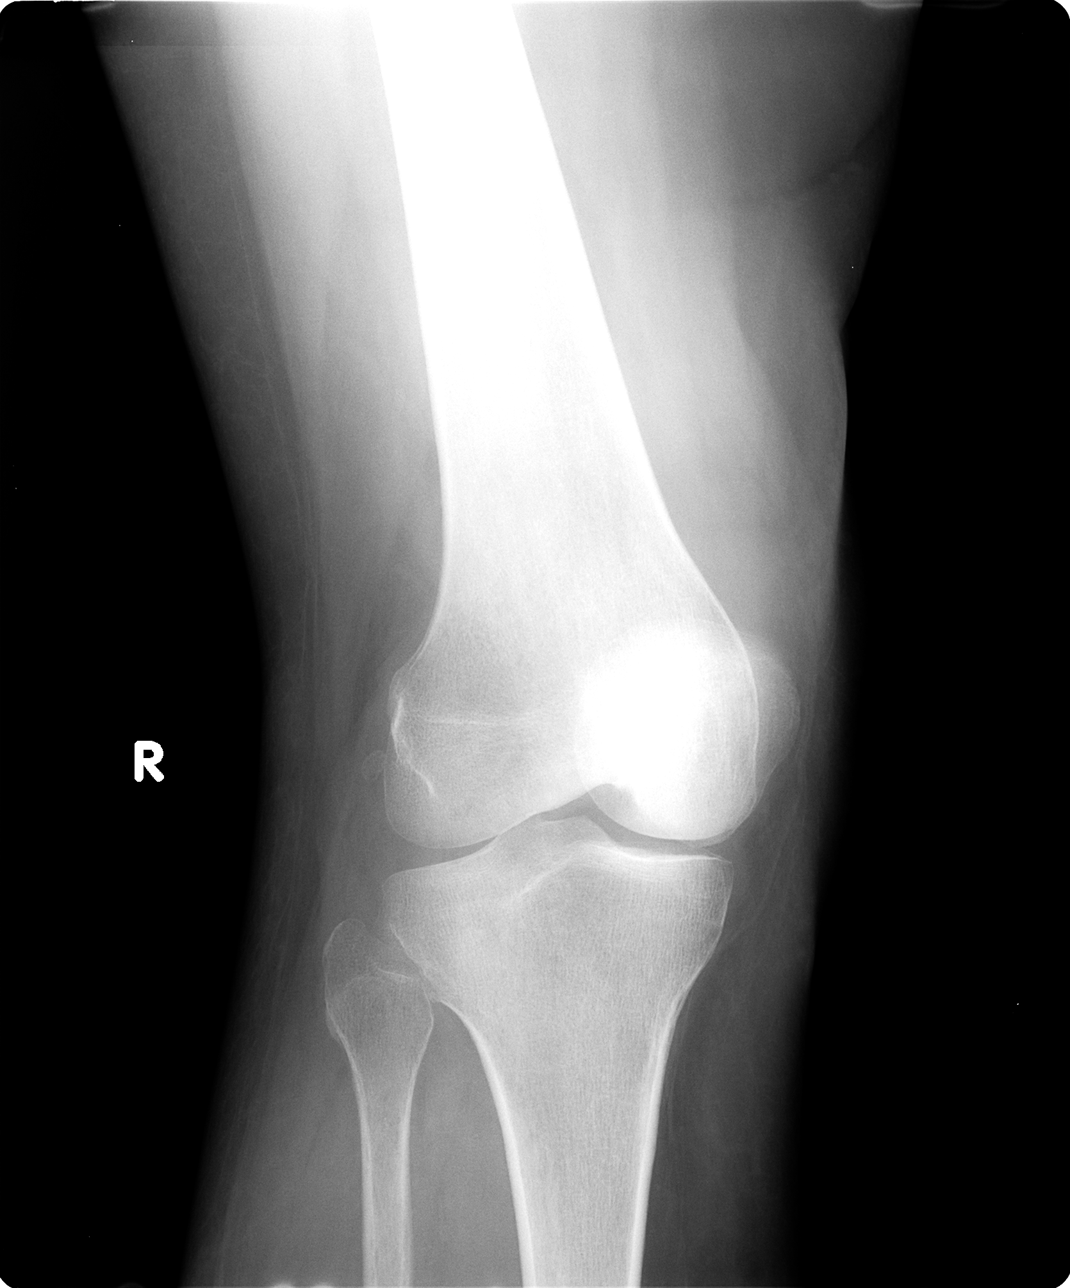

[view not recorded (4 of 4)]
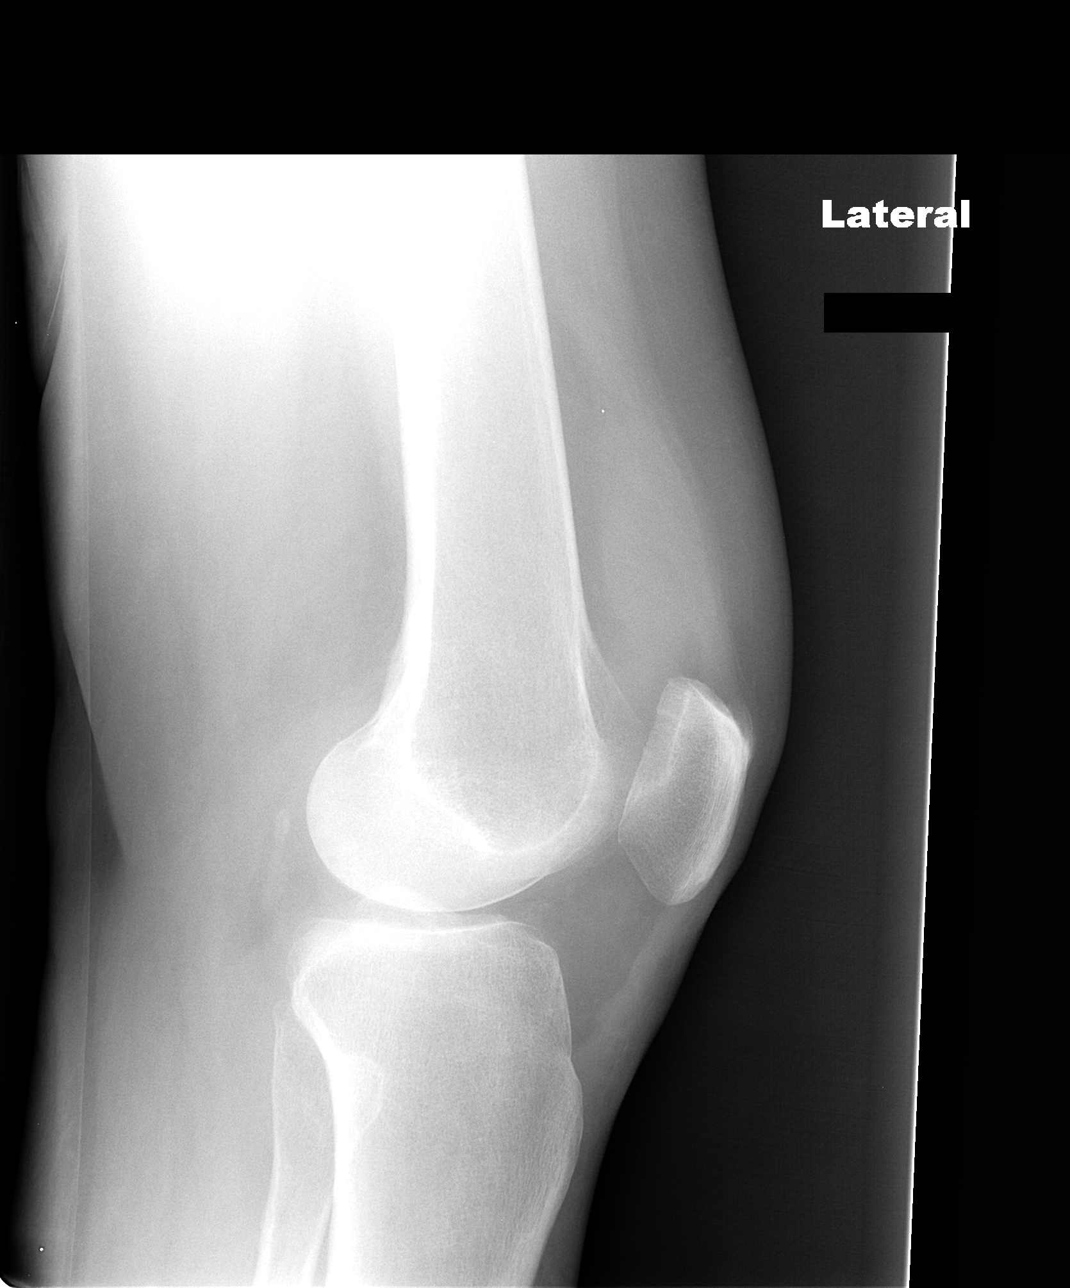

[4 of 4 positions shown; findings below may reference images not displayed]

FINDINGS: There is a moderate suprapatellar joint effusion.

There is no acute fracture or dislocation identified.

No radio-opaque foreign bodies or soft tissue calcifications.
IMPRESSION: 1.  Joint effusion

## 2011-01-21 DIAGNOSIS — I639 Cerebral infarction, unspecified: Secondary | ICD-10-CM

## 2011-01-21 DIAGNOSIS — I5022 Chronic systolic (congestive) heart failure: Secondary | ICD-10-CM

## 2011-01-21 HISTORY — DX: Cerebral infarction, unspecified: I63.9

## 2011-01-21 HISTORY — DX: Chronic systolic (congestive) heart failure: I50.22

## 2011-01-23 ENCOUNTER — Ambulatory Visit (INDEPENDENT_AMBULATORY_CARE_PROVIDER_SITE_OTHER): Payer: BC Managed Care – PPO

## 2011-01-23 DIAGNOSIS — I10 Essential (primary) hypertension: Secondary | ICD-10-CM

## 2011-01-23 DIAGNOSIS — N189 Chronic kidney disease, unspecified: Secondary | ICD-10-CM

## 2011-01-23 DIAGNOSIS — R269 Unspecified abnormalities of gait and mobility: Secondary | ICD-10-CM

## 2011-03-03 ENCOUNTER — Other Ambulatory Visit: Payer: Self-pay

## 2011-03-03 MED ORDER — CARVEDILOL 25 MG PO TABS: 25.0000 mg | ORAL_TABLET | Freq: Two times a day (BID) | ORAL | Status: DC

## 2011-04-01 ENCOUNTER — Emergency Department (HOSPITAL_COMMUNITY): Payer: BC Managed Care – PPO

## 2011-04-01 ENCOUNTER — Encounter (HOSPITAL_COMMUNITY): Payer: Self-pay | Admitting: Emergency Medicine

## 2011-04-01 ENCOUNTER — Observation Stay (HOSPITAL_COMMUNITY)
Admission: EM | Admit: 2011-04-01 | Discharge: 2011-04-03 | Disposition: A | Discharge status: Home / Self Care | Payer: BC Managed Care – PPO | Attending: Cardiology | Admitting: Cardiology

## 2011-04-01 DIAGNOSIS — E785 Hyperlipidemia, unspecified: Secondary | ICD-10-CM | POA: Insufficient documentation

## 2011-04-01 DIAGNOSIS — I509 Heart failure, unspecified: Secondary | ICD-10-CM | POA: Insufficient documentation

## 2011-04-01 DIAGNOSIS — E119 Type 2 diabetes mellitus without complications: Secondary | ICD-10-CM | POA: Insufficient documentation

## 2011-04-01 DIAGNOSIS — I129 Hypertensive chronic kidney disease with stage 1 through stage 4 chronic kidney disease, or unspecified chronic kidney disease: Secondary | ICD-10-CM | POA: Insufficient documentation

## 2011-04-01 DIAGNOSIS — N189 Chronic kidney disease, unspecified: Secondary | ICD-10-CM

## 2011-04-01 DIAGNOSIS — I5022 Chronic systolic (congestive) heart failure: Secondary | ICD-10-CM | POA: Insufficient documentation

## 2011-04-01 DIAGNOSIS — I10 Essential (primary) hypertension: Secondary | ICD-10-CM

## 2011-04-01 DIAGNOSIS — G4733 Obstructive sleep apnea (adult) (pediatric): Secondary | ICD-10-CM | POA: Insufficient documentation

## 2011-04-01 DIAGNOSIS — R079 Chest pain, unspecified: Principal | ICD-10-CM | POA: Insufficient documentation

## 2011-04-01 DIAGNOSIS — E039 Hypothyroidism, unspecified: Secondary | ICD-10-CM

## 2011-04-01 DIAGNOSIS — M109 Gout, unspecified: Secondary | ICD-10-CM | POA: Insufficient documentation

## 2011-04-01 DIAGNOSIS — E876 Hypokalemia: Secondary | ICD-10-CM | POA: Insufficient documentation

## 2011-04-01 HISTORY — DX: Chronic systolic (congestive) heart failure: I50.22

## 2011-04-01 HISTORY — DX: Obstructive sleep apnea (adult) (pediatric): G47.33

## 2011-04-01 HISTORY — DX: Chronic kidney disease, unspecified: N18.9

## 2011-04-01 HISTORY — DX: Hyperlipidemia, unspecified: E78.5

## 2011-04-01 HISTORY — DX: Essential (primary) hypertension: I10

## 2011-04-01 LAB — CBC
HCT: 40.6 % (ref 36.0–46.0)
Hemoglobin: 13.9 g/dL (ref 12.0–15.0)
Hemoglobin: 13.6 g/dL (ref 12.0–15.0)
MCH: 27 pg (ref 26.0–34.0)
MCH: 27.2 pg (ref 26.0–34.0)
MCV: 81.2 fL (ref 78.0–100.0)
Platelets: 187 10*3/uL (ref 150–400)
Platelets: 181 10*3/uL (ref 150–400)
RBC: 5.14 MIL/uL — ABNORMAL HIGH (ref 3.87–5.11)
RBC: 5 MIL/uL (ref 3.87–5.11)
WBC: 8.7 10*3/uL (ref 4.0–10.5)
WBC: 7.6 10*3/uL (ref 4.0–10.5)

## 2011-04-01 LAB — COMPREHENSIVE METABOLIC PANEL
ALT: 36 U/L — ABNORMAL HIGH (ref 0–35)
AST: 28 U/L (ref 0–37)
Alkaline Phosphatase: 67 U/L (ref 39–117)
CO2: 27 mEq/L (ref 19–32)
Calcium: 9.1 mg/dL (ref 8.4–10.5)
Chloride: 103 mEq/L (ref 96–112)
GFR calc Af Amer: 55 mL/min — ABNORMAL LOW (ref >90)
GFR calc non Af Amer: 47 mL/min — ABNORMAL LOW (ref >90)
Glucose, Bld: 92 mg/dL (ref 70–99)
Sodium: 140 mEq/L (ref 135–145)
Total Bilirubin: 0.4 mg/dL (ref 0.3–1.2)

## 2011-04-01 LAB — POCT I-STAT TROPONIN I
Troponin i, poc: 0 ng/mL (ref 0.00–0.08)
Troponin i, poc: 0 ng/mL (ref 0.00–0.08)

## 2011-04-01 LAB — CARDIAC PANEL(CRET KIN+CKTOT+MB+TROPI)
Relative Index: 1.5 (ref 0.0–2.5)
Total CK: 125 U/L (ref 7–177)
Troponin I: <0.3 ng/mL (ref <0.30)

## 2011-04-01 LAB — CREATININE, SERUM: GFR calc Af Amer: 54 mL/min — ABNORMAL LOW (ref >90)

## 2011-04-01 MED ORDER — SODIUM CHLORIDE 0.9 % IJ SOLN: 3.0000 mL | INTRAMUSCULAR | Status: DC | PRN

## 2011-04-01 MED ORDER — SODIUM CHLORIDE 0.9 % IJ SOLN
3.0000 mL | Freq: Two times a day (BID) | INTRAMUSCULAR | Status: DC
  Administered 2011-04-01 – 2011-04-03 (×4): 3 mL via INTRAVENOUS

## 2011-04-01 MED ORDER — LORATADINE 10 MG PO TABS
10.0000 mg | ORAL_TABLET | Freq: Every day | ORAL | Status: DC
  Administered 2011-04-01 – 2011-04-03 (×3): 10 mg via ORAL
  Filled 2011-04-01 (×3): qty 1

## 2011-04-01 MED ORDER — CARVEDILOL 25 MG PO TABS
50.0000 mg | ORAL_TABLET | Freq: Two times a day (BID) | ORAL | Status: DC
  Filled 2011-04-01: qty 2

## 2011-04-01 MED ORDER — NITROGLYCERIN 0.4 MG SL SUBL
0.4000 mg | SUBLINGUAL_TABLET | SUBLINGUAL | Status: AC | PRN
  Administered 2011-04-01 (×3): 0.4 mg via SUBLINGUAL

## 2011-04-01 MED ORDER — POTASSIUM CHLORIDE CRYS ER 20 MEQ PO TBCR
40.0000 meq | EXTENDED_RELEASE_TABLET | Freq: Once | ORAL | Status: AC
  Administered 2011-04-01: 40 meq via ORAL
  Filled 2011-04-01: qty 2

## 2011-04-01 MED ORDER — LORAZEPAM 1 MG PO TABS
1.0000 mg | ORAL_TABLET | Freq: Every day | ORAL | Status: DC | PRN
  Administered 2011-04-02 – 2011-04-03 (×2): 1 mg via ORAL
  Filled 2011-04-01 (×3): qty 1

## 2011-04-01 MED ORDER — NITROGLYCERIN 0.4 MG SL SUBL: 0.4000 mg | SUBLINGUAL_TABLET | SUBLINGUAL | Status: DC | PRN

## 2011-04-01 MED ORDER — ADULT MULTIVITAMIN W/MINERALS CH
1.0000 | ORAL_TABLET | Freq: Every day | ORAL | Status: DC
  Administered 2011-04-02 – 2011-04-03 (×2): 1 via ORAL
  Filled 2011-04-01 (×2): qty 1

## 2011-04-01 MED ORDER — POTASSIUM CHLORIDE CRYS ER 10 MEQ PO TBCR
10.0000 meq | EXTENDED_RELEASE_TABLET | Freq: Every day | ORAL | Status: DC
  Administered 2011-04-02 – 2011-04-03 (×2): 10 meq via ORAL
  Filled 2011-04-01 (×2): qty 1

## 2011-04-01 MED ORDER — ONDANSETRON HCL 4 MG/2ML IJ SOLN: 4.0000 mg | Freq: Four times a day (QID) | INTRAMUSCULAR | Status: DC | PRN

## 2011-04-01 MED ORDER — FEBUXOSTAT 40 MG PO TABS
80.0000 mg | ORAL_TABLET | Freq: Every day | ORAL | Status: DC
  Administered 2011-04-02 – 2011-04-03 (×2): 80 mg via ORAL
  Filled 2011-04-01 (×2): qty 2

## 2011-04-01 MED ORDER — ACETAMINOPHEN 325 MG PO TABS
650.0000 mg | ORAL_TABLET | ORAL | Status: DC | PRN
  Administered 2011-04-02: 650 mg via ORAL
  Filled 2011-04-01: qty 2

## 2011-04-01 MED ORDER — CARVEDILOL 25 MG PO TABS
50.0000 mg | ORAL_TABLET | Freq: Two times a day (BID) | ORAL | Status: DC
  Administered 2011-04-01 – 2011-04-03 (×4): 50 mg via ORAL
  Filled 2011-04-01 (×6): qty 2

## 2011-04-01 MED ORDER — ENOXAPARIN SODIUM 40 MG/0.4ML ~~LOC~~ SOLN
40.0000 mg | SUBCUTANEOUS | Status: DC
  Administered 2011-04-01 – 2011-04-02 (×2): 40 mg via SUBCUTANEOUS
  Filled 2011-04-01 (×3): qty 0.4

## 2011-04-01 MED ORDER — ATORVASTATIN CALCIUM 20 MG PO TABS
20.0000 mg | ORAL_TABLET | Freq: Every day | ORAL | Status: DC
  Administered 2011-04-01 – 2011-04-02 (×2): 20 mg via ORAL
  Filled 2011-04-01 (×3): qty 1

## 2011-04-01 MED ORDER — FUROSEMIDE 20 MG PO TABS
20.0000 mg | ORAL_TABLET | Freq: Every day | ORAL | Status: DC
  Administered 2011-04-02 – 2011-04-03 (×2): 20 mg via ORAL
  Filled 2011-04-01 (×2): qty 1

## 2011-04-01 MED ORDER — ASPIRIN EC 81 MG PO TBEC
81.0000 mg | DELAYED_RELEASE_TABLET | Freq: Every day | ORAL | Status: DC
  Administered 2011-04-02 – 2011-04-03 (×2): 81 mg via ORAL
  Filled 2011-04-01 (×2): qty 1

## 2011-04-01 MED ORDER — LEVOTHYROXINE SODIUM 25 MCG PO TABS
25.0000 ug | ORAL_TABLET | Freq: Every day | ORAL | Status: DC
  Administered 2011-04-01 – 2011-04-03 (×3): 25 ug via ORAL
  Filled 2011-04-01 (×3): qty 1

## 2011-04-01 MED ORDER — ASPIRIN 81 MG PO CHEW
324.0000 mg | CHEWABLE_TABLET | Freq: Once | ORAL | Status: AC
  Administered 2011-04-01: 324 mg via ORAL
  Filled 2011-04-01: qty 4

## 2011-04-01 MED ORDER — HYDRALAZINE HCL 20 MG/ML IJ SOLN
10.0000 mg | INTRAMUSCULAR | Status: AC | PRN
  Administered 2011-04-02 (×2): 10 mg via INTRAVENOUS
  Filled 2011-04-01 (×2): qty 0.5

## 2011-04-01 MED ORDER — ZOLPIDEM TARTRATE 5 MG PO TABS: 10.0000 mg | ORAL_TABLET | Freq: Every evening | ORAL | Status: DC | PRN

## 2011-04-01 MED ORDER — SODIUM CHLORIDE 0.9 % IV SOLN: 250.0000 mL | INTRAVENOUS | Status: DC | PRN

## 2011-04-01 MED ORDER — IRBESARTAN 300 MG PO TABS
300.0000 mg | ORAL_TABLET | Freq: Every day | ORAL | Status: DC
  Administered 2011-04-02 – 2011-04-03 (×2): 300 mg via ORAL
  Filled 2011-04-01 (×2): qty 1

## 2011-04-01 MED ORDER — LORAZEPAM 1 MG PO TABS
1.0000 mg | ORAL_TABLET | Freq: Once | ORAL | Status: AC
  Administered 2011-04-01: 1 mg via ORAL
  Filled 2011-04-01: qty 1

## 2011-04-01 NOTE — ED Notes (Signed)
Patient transported to X-ray 

## 2011-04-01 NOTE — ED Notes (Signed)
Pt resting quietly at the time. Denies chest pain. Remains on cardiac monitor. Vital signs stable.

## 2011-04-01 NOTE — ED Notes (Signed)
2034-01 Ready

## 2011-04-01 NOTE — ED Notes (Signed)
Pt reports increase in stress at work for past couple of months.

## 2011-04-01 NOTE — ED Provider Notes (Signed)
History     CSN: [id number]  Arrival date & time 04/01/11  1416   First MD Initiated Contact with Patient 04/01/11 1441      Chief Complaint  Patient presents with  . Chest Pain    Left sided non radiating.    (Consider location/radiation/quality/duration/timing/severity/associated sxs/prior treatment) HPI History provided by the patient.  56 year old female with h/o DM, HTN, and CHF presenting with complaint of chest pain. Patient's pain began yesterday around 4 PM and was relatively acute in onset. Patient's pain is left-sided and initially sharp with most recent episode of pressure. Patient states that yesterday she at work when her pain came on, and she attributed her pain distress. This episode lasted about one to 2 hours and resolved after arriving home. Then today about 2 hours prior to ED arrival, she had another episode while working/at rest, stating that her pain lasted about an hour and resolved around the time of EMS arrival. She then had a repeat episode in route to the ED with a complaint of pressure. No medications given prior to arrival. Patient's pain was 6/10 in severity at the maximum and is now 2/10. No radiation. No associated symptoms, including no shortness of breath, nausea, cough, or fever. Patient cannot recall any alleviating or exacerbating factors.  Patient has had a similar episode in 2011, for which she was seen here and reports having a normal stress test. Patient has not been seen recently for this current complaints.   Past Medical History  Diagnosis Date  . Gout   . Hypertension   . CHF (congestive heart failure)   . Diabetes mellitus borderline    Past Surgical History  Procedure Date  . Abdominal hysterectomy partial    History reviewed. No pertinent family history.  History  Substance Use Topics  . Smoking status: Never Smoker   . Smokeless tobacco: Not on file  . Alcohol Use: No    OB History    Grav Para Term Preterm Abortions TAB  SAB Ect Mult Living                  Review of Systems  Constitutional: Negative for fever and chills.  HENT: Positive for congestion and rhinorrhea. Negative for sore throat.   Eyes: Negative for pain and visual disturbance.  Respiratory: Negative for cough, shortness of breath and wheezing.   Cardiovascular: Positive for chest pain. Negative for palpitations and leg swelling.  Gastrointestinal: Negative for nausea, vomiting, abdominal pain, diarrhea and blood in stool.  Genitourinary: Negative for dysuria and hematuria.  Musculoskeletal: Negative for back pain and gait problem.  Skin: Negative for rash and wound.  Neurological: Negative for dizziness and headaches.  Psychiatric/Behavioral: Negative for confusion and agitation.  All other systems reviewed and are negative.    Allergies  Sulfa antibiotics  Home Medications   Current Outpatient Rx  Name Route Sig Dispense Refill  . ASPIRIN EC 81 MG PO TBEC Oral Take 81 mg by mouth daily.    Marland Kitchen CARVEDILOL 25 MG PO TABS Oral Take 50 mg by mouth 2 (two) times daily with a meal.    . CETIRIZINE HCL 10 MG PO TABS Oral Take 10 mg by mouth daily.    . FEBUXOSTAT 40 MG PO TABS Oral Take 80 mg by mouth daily.    Marland Kitchen FLUTICASONE PROPIONATE 50 MCG/ACT NA SUSP Nasal Place 2 sprays into the nose daily.    . FUROSEMIDE 20 MG PO TABS Oral Take 20 mg by mouth  daily.    . IBUPROFEN 200 MG PO TABS Oral Take 400 mg by mouth every 6 (six) hours as needed. For pain    . LEVOTHYROXINE SODIUM 25 MCG PO TABS Oral Take 25 mcg by mouth daily.    Marland Kitchen LORAZEPAM 1 MG PO TABS Oral Take 1 mg by mouth daily as needed. For anxiety    . ADULT MULTIVITAMIN W/MINERALS CH Oral Take 1 tablet by mouth daily.    Marland Kitchen OLMESARTAN MEDOXOMIL 40 MG PO TABS Oral Take 40 mg by mouth 2 (two) times daily.      BP 225/107  Temp(Src) 98.2 F (36.8 C) (Oral)  Resp 15  SpO2 100%  Physical Exam  Nursing note and vitals reviewed. Constitutional: She is oriented to person, place,  and time. No distress.       Morbidly obese, alert, oriented, smiles  HENT:  Head: Normocephalic and atraumatic.  Right Ear: External ear normal.  Left Ear: External ear normal.  Nose: Nose normal.  Mouth/Throat: Oropharynx is clear and moist.  Eyes: Conjunctivae and EOM are normal. Pupils are equal, round, and reactive to light.  Neck: Normal range of motion. Neck supple.  Cardiovascular: Normal rate, regular rhythm and intact distal pulses.   No murmur heard. Pulmonary/Chest: Effort normal and breath sounds normal. No respiratory distress. She has no wheezes. She has no rales. She exhibits tenderness (2 bilateral chest, different from patient's subjective complaint).  Abdominal: Soft. Bowel sounds are normal. There is no tenderness.  Musculoskeletal: Normal range of motion. She exhibits no edema and no tenderness.  Neurological: She is alert and oriented to person, place, and time.  Skin: Skin is warm and dry. No rash noted. She is not diaphoretic.  Psychiatric: She has a normal mood and affect. Judgment normal.    ED Course  Procedures (including critical care time)   Date: 04/01/2011  Rate: 68  Rhythm: normal sinus rhythm  QRS Axis: normal  Intervals:  First degree AV block; NL QTc  ST/T Wave abnormalities: normal Poor R progression similar to prior  Old EKG Reviewed:  Prior on 05/13/09 with tachy, possible AVNRT    Labs Reviewed  CBC - Abnormal; Notable for the following:    RBC 5.14 (*)    All other components within normal limits  COMPREHENSIVE METABOLIC PANEL - Abnormal; Notable for the following:    Potassium 3.3 (*)    Creatinine, Ser 1.27 (*)    ALT 36 (*)    GFR calc non Af Amer 47 (*)    GFR calc Af Amer 55 (*)    All other components within normal limits  POCT I-STAT TROPONIN I   Dg Chest 2 View  04/01/2011  *RADIOLOGY REPORT*  Clinical Data: Left-sided chest pain with shortness of breath  CHEST - 2 VIEW  Comparison: 04/22/2009; 04/21/2009  Findings:  Grossly unchanged enlarged cardiac silhouette and mediastinal contours with persistently reduced lung volumes. Overall improved aeration of the right mid lung with resolution of ill-defined heterogeneous air space opacities.  No new focal airspace opacities, though note, examination is degraded secondary to patient body habitus.  No pneumothorax or pleural effusion. Grossly unchanged bones.  IMPRESSION:     1.     Persistently reduced lung volumes without acute       cardiopulmonary disease.        2.    Resolution of right mid lung airspace opacities.  Original Report Authenticated By: Rachel Moulds, M.D.     1.  Chest pain       MDM  56 year old female presented complaining of acute chest pain while at rest without associated symptoms and 3 episodes since yesterday, patient believes secondary to stress.  Exam as above, sig HTN, clear lungs.  ACS possible.  Doubt PE, dissection, or PNA.  EKG without acute ischemia.  Labs with NL trop and WBC; improved creat from prior.  [facility] cards consulted for admission for r/o ACS.       Chana Bode, MD 04/01/11 1705

## 2011-04-01 NOTE — ED Notes (Signed)
No relief of chest pain with nitroglycerin, rating 2/10 at the time. States pain moved from L side to center of chest. Remains on cardiac monitor. Vital signs stable. No signs of distress noted. Admitting at the bedside.

## 2011-04-01 NOTE — H&P (Signed)
Cardiology Admission Note   Patient ID: Chelsea Roberts MRN: [id number], DOB/AGE: 03/26/1955   Admit date: 04/01/2011 Date of Consult: 04/01/2011  Primary Physician: No primary provider on file. Primary Cardiologist: Minus Breeding, MD  Pt. Profile: [name] is a 56yo AA female with PMHx significant for chronic systolic CHF, remote hx of PSVT (04/2009 in the setting of urosepsis), longstanding HTN, HL, morbid obesity, OSA, CKD (stage III) and hypothyroidism who presented to [facility] ED today with complaints of chest pain.   Of note, most recent echo revealed LVEF 45% with anteroseptal hypokinesis, indeterminate diastolic function due to tachycardia, mild LVH, no significant valvular abnormalities. Overall technically limited secondary to body habitus. She underwent Lexiscan Myoview described below with normal findings and LVEF 53%.  PREVIOUS CARDIAC STUDIES  2D echocardiogram- 04/23/2009   -------------------------------------------------------------------- Study Conclusions  - Left ventricle: The cavity size was normal. Wall thickness was increased in a pattern of mild LVH. Diastolic function is indeterminant (tachycardic). Systolic function was mildly reduced. The estimated ejection fraction was 45%. The anteroseptal wall appeared hypokinetic. Not all wall segments were adequately visualized. - Aortic valve: There was no stenosis. - Mitral valve: No significant regurgitation. - Left atrium: The atrium was mildly dilated. - Right ventricle: Poorly visualized. The cavity size was normal. Systolic function was normal. - Pulmonary arteries: No complete TR doppler jet so unable to estimate PA systolic pressure. - Systemic veins: The IVC measured 2.1 cm with about 50% respirophasic variation, suggesting RA pressure around 10 mmHg. - Pericardium, extracardiac: A trivial pericardial effusion was identified posterior to the heart.   Impressions:  - Technically difficult study with poor  acoustic windows. Normal LV size with mild LV hypertrophy. EF estimated at 45% with anteroseptal hypokinesis. Not all wall segments were adequately visualized. No signifcant valvular dysfunction. Transthoracic echocardiography. M-mode, complete 2D, spectral Doppler, and color Doppler. Height: Height: 170cm. Height: 66.9in. Weight: Weight: 109kg. Weight: 239.8lb. Body mass index: BMI: 37.7kg/m^2. Body surface area: BSA: 2.46m^2. Blood pressure: 173/63. Patient status: Inpatient. Location: Bedside. -------------------------------------------------------------------- -------------------------------------------------------------------- Left ventricle: The cavity size was normal. Wall thickness was increased in a pattern of mild LVH. Diastolic function is indeterminant (tachycardic). Systolic function was mildly reduced. The estimated ejection fraction was 45%. The anteroseptal wall appeared hypokinetic. Not all wall segments were adequately visualized. -------------------------------------------------------------------- Aortic valve: Trileaflet. Doppler: There was no stenosis. No regurgitation. -------------------------------------------------------------------- Aorta: Aortic root: The aortic root was normal in size. Ascending aorta: The ascending aorta was normal in size. -------------------------------------------------------------------- Mitral valve: Doppler: Transvalvular velocity was within the normal range. There was no evidence for stenosis. No significant regurgitation. -------------------------------------------------------------------- Left atrium: The atrium was mildly dilated. -------------------------------------------------------------------- Right ventricle: Poorly visualized. The cavity size was normal. Systolic function was normal. -------------------------------------------------------------------- Pulmonic valve: Structurally normal valve. Cusp separation was normal. Doppler:  Transvalvular velocity was within the normal range. Trivial regurgitation. -------------------------------------------------------------------- Tricuspid valve: Structurally normal valve. Leaflet separation was normal. Doppler: Transvalvular velocity was within the normal range. Trivial regurgitation. -------------------------------------------------------------------- Pulmonary artery: No complete TR doppler jet so unable to estimate PA systolic pressure. -------------------------------------------------------------------- Right atrium: The atrium was normal in size. -------------------------------------------------------------------- Pericardium: A trivial pericardial effusion was identified posterior to the heart. -------------------------------------------------------------------- Systemic veins: The IVC measured 2.1 cm with about 50% respirophasic variation, suggesting RA pressure around 10 mmHg.  ------------------------------------------------------------------- 2D measurements Normal Doppler measurements Normal Left ventricle Systemic veins LVID ED, 39.12 mm 43-52 Estimated CVP 10 mm Hg ------ chord, PLAX Pulmonic valve LVID ES, 22 mm 23-38 Regurg vel, ED 213 cm/s ------  chord, PLAX FS, chord, 44 % >29 PLAX LVPW, ED 14.66 mm ------ IVS/LVPW 1.04 <1.3 ratio, ED Ventricular septum IVS, ED 15.21 mm ------ Aorta Root diam, ED 32 mm ------ Left atrium AP dim 41 mm ------ AP dim index 1.88 cm/m^2 <2.2  Lexiscan Myoview- 04/24/2009  Technique: Standard myocardial SPECT imaging was performed after  resting intravenous injection of 10 mCi Tc-31m tetrofosmin.  Subsequently, intravenous infusion of regadenoson was performed  under the supervision of the Cardiology staff. At peak effect of  the drug, 30 mCi Tc-1m tetrofosmin was injected intravenously and  standard myocardial SPECT imaging was performed. Quantitative  gated imaging was also performed to evaluate left ventricular  wall  motion, and estimate left ventricular ejection fraction.  Comparison: Myocardial perfusion examination report from 05/27/1998  Findings: The myocardial perfusion is normal on the stress images.  There is no evidence for a reversible or fixed defect. Mild  hypokinesia along the inferior wall. Otherwise, the wall motion is  normal. The end-diastolic volume is 0000000 ml and end-systolic volume  is 47 ml. Calculated ejection fraction is 57%.  IMPRESSION:  Normal myocardial stress perfusion examination. No evidence for a  reversible defect or ischemia.  Ejection fraction is 57%.  Problem List: Past Medical History  Diagnosis Date  . Gout   . Hypertension   . Diabetes mellitus borderline  . Chronic kidney disease   . Hyperlipidemia   . OSA (obstructive sleep apnea)   . Systolic CHF     Past Surgical History  Procedure Date  . Abdominal hysterectomy partial     Allergies:  Allergies  Allergen Reactions  . Sulfa Antibiotics Swelling   HPI:   She reports experiencing chest pain yesterday around 1700 while at work described as sharp, L-sided lasting for about an hour and a half rated at a 9/10 with associated shortness of breath. She took ibuprofen with some relief, and vicodin with relief. This morning, she was asymptomatic. She went to work and around 1200, the pain recurred rated 6/10 and constant. With associated face "tingling." Her BP was checked at work and was unable to be determined. EMS was called and SBP was found to be 220. She states that she has had increased stress at work. She endorses increased DOE over the past few weeks. She takes a daily aspirin. She states she took her morning antihypertensives today. She denies SOB at baseline, lightheadedness, palpitations, n/v, cough, fevers, chills orthopnea, PND, increased LE edema, medication noncompliance, extended travel and sick contacts.  BP in the ED was noted to be 225/107 and 205/106 an hour apart. Vital signs  otherwise stable. EKG without ischemic changes. POC TnI neg. CXR revealed persistently reduced lung volumes without acute cardiopulmonary disease when compared to prior radiographs in 04/2009. She was given NTG SL x 3 with relief to 2/10.   Home Medications: Prior to Admission medications   Medication Sig Start Date End Date Taking? Authorizing Provider  aspirin EC 81 MG tablet Take 81 mg by mouth daily.   Yes Historical Provider, MD  carvedilol (COREG) 25 MG tablet Take 50 mg by mouth 2 (two) times daily with a meal. 03/03/11 03/02/12 Yes Dionne Bucy McClung, PA-C  cetirizine (ZYRTEC) 10 MG tablet Take 10 mg by mouth daily.   Yes Historical Provider, MD  febuxostat (ULORIC) 40 MG tablet Take 80 mg by mouth daily.   Yes Historical Provider, MD  fluticasone (FLONASE) 50 MCG/ACT nasal spray Place 2 sprays into the nose daily.   Yes  Historical Provider, MD  furosemide (LASIX) 20 MG tablet Take 20 mg by mouth daily.   Yes Historical Provider, MD  ibuprofen (ADVIL,MOTRIN) 200 MG tablet Take 400 mg by mouth every 6 (six) hours as needed. For pain   Yes Historical Provider, MD  levothyroxine (SYNTHROID, LEVOTHROID) 25 MCG tablet Take 25 mcg by mouth daily.   Yes Historical Provider, MD  LORazepam (ATIVAN) 1 MG tablet Take 1 mg by mouth daily as needed. For anxiety   Yes Historical Provider, MD  Multiple Vitamin (MULITIVITAMIN WITH MINERALS) TABS Take 1 tablet by mouth daily.   Yes Historical Provider, MD  olmesartan (BENICAR) 40 MG tablet Take 40 mg by mouth 2 (two) times daily.   Yes Historical Provider, MD   Inpatient Medications:     . aspirin  324 mg Oral Once  . LORazepam  1 mg Oral Once    (Not in a hospital admission)  Family History  Problem Relation Age of Onset  . Heart failure Sister   . Kidney disease Sister   . Hypertension Mother   . Hypertension Sister   . Diabetes type II Sister      History   Social History  . Marital Status: Single    Spouse Name: N/A    Number of  Children: 0  . Years of Education: N/A   Occupational History  . Insurance agent    Social History Main Topics  . Smoking status: Never Smoker   . Smokeless tobacco: Not on file  . Alcohol Use: No  . Drug Use: No  . Sexually Active: Not on file   Other Topics Concern  . Not on file   Social History Narrative   Lives in [place], [place] alone.      Review of Systems: General: negative for chills, fever, night sweats or weight changes.  Cardiovascular: positive for chest pain, shortness of breath and progressively worsening dyspnea on exertion; negative for edema, orthopnea, palpitations, paroxysmal nocturnal dyspnea or shortness of breath Dermatological: negative for rash Respiratory: negative for cough or wheezing Urologic: negative for hematuria Abdominal: negative for nausea, vomiting, diarrhea, bright red blood per rectum, melena, or hematemesis Neurologic: negative for visual changes, syncope, or dizziness All other systems reviewed and are otherwise negative except as noted above.  Physical Exam: Blood pressure 202/113, pulse 80, temperature 98.2 F (36.8 C), temperature source Oral, resp. rate 18, SpO2 99.00%.    General: Morbidly obese, in no acute distress. Head: Normocephalic, atraumatic, sclera non-icteric, no xanthomas.  Neck: Negative for carotid bruits. JVD not elevated. Lungs: Distant breath sounds. Normal respiratory effort. Clear bilaterally to auscultation without wheezes, rales, or rhonchi. Breathing is unlabored. Heart: RRR with S1 S2. No murmurs, rubs, or gallops appreciated. Abdomen: Soft, non-tender, non-distended with normoactive bowel sounds. No hepatomegaly. No rebound/guarding. No obvious abdominal masses. Msk:  Strength and tone appears normal for age. Extremities: No clubbing, cyanosis or edema.  Distal pedal pulses are 2+ and equal bilaterally. Neuro: Alert and oriented X 3. Moves all extremities spontaneously. Psych:  Responds to questions  appropriately with a normal affect.  Labs: Recent Labs  Basename 04/01/11 1454   WBC 8.7   HGB 13.9   HCT 41.5   MCV 80.7   PLT 187    Lab 04/01/11 1454  NA 140  K 3.3*  CL 103  CO2 27  BUN 15  CREATININE 1.27*  CALCIUM 9.1  PROT 6.7  BILITOT 0.4  ALKPHOS 67  ALT 36*  AST 28  AMYLASE --  LIPASE --  GLUCOSE 92   Radiology/Studies  Comparison: 04/22/2009; May 08, 2009  Findings: Grossly unchanged enlarged cardiac silhouette and  mediastinal contours with persistently reduced lung volumes.  Overall improved aeration of the right mid lung with resolution of  ill-defined heterogeneous air space opacities. No new focal  airspace opacities, though note, examination is degraded secondary  to patient body habitus. No pneumothorax or pleural effusion.  Grossly unchanged bones.  IMPRESSION:  1. Persistently reduced lung volumes without acute  cardiopulmonary disease.  2. Resolution of right mid lung airspace opacities.   EKG: NSR, 1st degree AVB, LVH, no ST-T wave changes  ASSESSMENT AND PLAN:   1. Chest pain- atypical for cardiac source; described as sharp, L-sided and intermittent since yesterday with associated shortness of breath. Patient does endorse DOE over the past month, otherwise, denies all other ischemic symptoms listed in HPI. Denies fevers, chills and cough. EKG without evidence of ischemia, POC TnI neg. CXR without acute findings. BP was markedly elevated on admission. Patient states that she took her morning dose of antihypertensives today, but has been under a lot of stress at work which could certainly cause her to be more hypertensive. Given the patient's cardiac risk factors, history of systolic CHF, will admit for 24-hour ACS rule out.  - Admit to telemetry  - Cycle cardiac enzymes  - Heart healthy diet  - Daily EKGs  - Continue ASA/BB/NTG SL PRN  - Will start statin  - Will order D-dimer  2. Hypertension- BP markedly elevated on admission, current  SBP 170s; possibly stress-induced  - Will need aggressive BP control  - Will discuss increasing Coreg and CCB with MD  - Continue ARB   3. Chronic systolic CHF- patient denies symptoms of heart failure including increased dyspnea at rest, orthopnea, PND, increased abdominal/LE edema or cough. Euvolemic on exam. No evidence of pulmonary congestion on CXR.   - Continue Lasix PO daily  - 2D echo as above  - Will check pBNP  4. Hypokalemia- 3.3 today  - Will replete with 40 KCl mEq now, then 10 mEq daily  5. Hyperlipidemia- noted in the past, patient not on a statin; denies statin intolerance  - Will check lipid panel   - Will start on statin as above   Signed, R. Valeria Batman, PA-C 04/01/2011, 4:34 PM  History reviewed with the patient, no changes to be made.  Patient with pain as described above.  She has had increased stress at work.  Today she had sharp shooting pain.  This is unlike previous pain.  She called her supervisor and the company EMT tried to take her BP.  It was too high to read.  She came to the ER.  Her pain is currently relieved after NTG SL.  EKG was non acute and enzymes thus far are negative.  She had a negative stress test in 2011.The patient exam reveals Lungs:  Clear, COR RRR no rub, ABD obese, Ext no edema.  All available labs, radiology testing, previous records reviewed. Agree with documented assessment and plan. Pain is atypical.  She has had no evidence of ischemia.  Negative work up previously.  Observe overnight and rule out MI.  Doubt that further work up will be needed.  She does need follow up and treatment of her BP.  She reports that this is well controlled at home and that she checks it frequently.  She also needs an appt. With an outpatient sleep MD as she has  apnea but currently this is not treated.   Jeneen Rinks Praneeth Bussey  5:13 PM 12/06/2010

## 2011-04-01 NOTE — ED Notes (Signed)
Pt was sitting at her desk and began to experience left sided chest pain. Pt had similar episode yesterday. Pt hypertensive for EMS 250/146.

## 2011-04-01 NOTE — ED Notes (Signed)
Pt presents to department for evaluation of L sided non radiating chest pain. Onset yesterday afternoon while sitting at work. States pain started again today while at work. States she has been under a large amount of stress. 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 2/10 pain at the time. Denies SOB. She is conscious alert and oriented x4. Nothing makes pain worse. No signs of distress noted at the time.

## 2011-04-02 ENCOUNTER — Other Ambulatory Visit: Payer: Self-pay

## 2011-04-02 LAB — CARDIAC PANEL(CRET KIN+CKTOT+MB+TROPI)
Relative Index: 1.5 (ref 0.0–2.5)
Relative Index: 1.7 (ref 0.0–2.5)
Total CK: 125 U/L (ref 7–177)
Troponin I: <0.3 ng/mL (ref <0.30)

## 2011-04-02 LAB — BASIC METABOLIC PANEL
CO2: 25 mEq/L (ref 19–32)
Calcium: 9.3 mg/dL (ref 8.4–10.5)
Chloride: 105 mEq/L (ref 96–112)
Glucose, Bld: 203 mg/dL — ABNORMAL HIGH (ref 70–99)
Potassium: 3.7 mEq/L (ref 3.5–5.1)
Sodium: 142 mEq/L (ref 135–145)

## 2011-04-02 LAB — CBC
Hemoglobin: 14 g/dL (ref 12.0–15.0)
MCH: 27.1 pg (ref 26.0–34.0)
Platelets: 185 10*3/uL (ref 150–400)
RBC: 5.17 MIL/uL — ABNORMAL HIGH (ref 3.87–5.11)
WBC: 7.9 10*3/uL (ref 4.0–10.5)

## 2011-04-02 LAB — LIPID PANEL
Cholesterol: 233 mg/dL — ABNORMAL HIGH (ref 0–200)
Total CHOL/HDL Ratio: 6 RATIO
Triglycerides: 223 mg/dL — ABNORMAL HIGH (ref <150)
VLDL: 45 mg/dL — ABNORMAL HIGH (ref 0–40)

## 2011-04-02 MED ORDER — AMLODIPINE BESYLATE 5 MG PO TABS
5.0000 mg | ORAL_TABLET | Freq: Every day | ORAL | Status: DC
  Administered 2011-04-02 – 2011-04-03 (×2): 5 mg via ORAL
  Filled 2011-04-02 (×2): qty 1

## 2011-04-02 NOTE — Progress Notes (Signed)
Rechecked pt's BP after IV hydralazine. BP 172/99. Pt feels anxious, will give po ativan

## 2011-04-02 NOTE — ED Provider Notes (Signed)
I saw and evaluated the patient, reviewed the resident's note and I agree with the findings and plan.   .Face to face Exam:  General:  Awake HEENT:  Atraumatic Resp:  Normal effort Abd:  Nondistended Neuro:No focal weakness Lymph: No adenopathy   Dot Lanes, MD 04/02/11 2208

## 2011-04-02 NOTE — Progress Notes (Signed)
SUBJECTIVE:  No further chest pain   PHYSICAL EXAM Filed Vitals:   04/02/11 0603 04/02/11 0612 04/02/11 0644 04/02/11 0746  BP: 187/95 187/95 180/84 182/101  Pulse:  79  85  Temp:  97.6 F (36.4 C)    TempSrc:  Oral    Resp:  19    Height:      Weight:      SpO2:  97%     General:  No distress Lungs:  No clear Heart:  No rubs, no murmur Abdomen:  Positive bowel sounds, no rebound no guarding Extremities:  No edema.  LABS: Lab Results  Component Value Date   CKTOTAL 125 04/02/2011   CKMB 1.9 04/02/2011   TROPONINI <0.30 04/02/2011   Results for orders placed during the hospital encounter of 04/01/11 (from the past 24 hour(s))  CBC     Status: Abnormal   Collection Time   04/01/11  2:54 PM      Component Value Range   WBC 8.7  4.0 - 10.5 (K/uL)   RBC 5.14 (*) 3.87 - 5.11 (MIL/uL)   Hemoglobin 13.9  12.0 - 15.0 (g/dL)   HCT 41.5  36.0 - 46.0 (%)   MCV 80.7  78.0 - 100.0 (fL)   MCH 27.0  26.0 - 34.0 (pg)   MCHC 33.5  30.0 - 36.0 (g/dL)   RDW 14.4  11.5 - 15.5 (%)   Platelets 187  150 - 400 (K/uL)  COMPREHENSIVE METABOLIC PANEL     Status: Abnormal   Collection Time   04/01/11  2:54 PM      Component Value Range   Sodium 140  135 - 145 (mEq/L)   Potassium 3.3 (*) 3.5 - 5.1 (mEq/L)   Chloride 103  96 - 112 (mEq/L)   CO2 27  19 - 32 (mEq/L)   Glucose, Bld 92  70 - 99 (mg/dL)   BUN 15  6 - 23 (mg/dL)   Creatinine, Ser 1.27 (*) 0.50 - 1.10 (mg/dL)   Calcium 9.1  8.4 - 10.5 (mg/dL)   Total Protein 6.7  6.0 - 8.3 (g/dL)   Albumin 3.7  3.5 - 5.2 (g/dL)   AST 28  0 - 37 (U/L)   ALT 36 (*) 0 - 35 (U/L)   Alkaline Phosphatase 67  39 - 117 (U/L)   Total Bilirubin 0.4  0.3 - 1.2 (mg/dL)   GFR calc non Af Amer 47 (*) >90 (mL/min)   GFR calc Af Amer 55 (*) >90 (mL/min)  POCT I-STAT TROPONIN I     Status: Normal   Collection Time   04/01/11  3:15 PM      Component Value Range   Troponin i, poc 0.00  0.00 - 0.08 (ng/mL)   Comment 3           POCT I-STAT TROPONIN I      Status: Normal   Collection Time   04/01/11  6:18 PM      Component Value Range   Troponin i, poc 0.00  0.00 - 0.08 (ng/mL)   Comment 3           CARDIAC PANEL(CRET KIN+CKTOT+MB+TROPI)     Status: Normal   Collection Time   04/01/11  8:05 PM      Component Value Range   Total CK 125  7 - 177 (U/L)   CK, MB 1.9  0.3 - 4.0 (ng/mL)   Troponin I <0.30  <0.30 (ng/mL)   Relative Index 1.5  0.0 -  2.5   TSH     Status: Normal   Collection Time   04/01/11  8:06 PM      Component Value Range   TSH 2.911  0.350 - 4.500 (uIU/mL)  CBC     Status: Normal   Collection Time   04/01/11  8:06 PM      Component Value Range   WBC 7.6  4.0 - 10.5 (K/uL)   RBC 5.00  3.87 - 5.11 (MIL/uL)   Hemoglobin 13.6  12.0 - 15.0 (g/dL)   HCT 40.6  36.0 - 46.0 (%)   MCV 81.2  78.0 - 100.0 (fL)   MCH 27.2  26.0 - 34.0 (pg)   MCHC 33.5  30.0 - 36.0 (g/dL)   RDW 14.6  11.5 - 15.5 (%)   Platelets 181  150 - 400 (K/uL)  CREATININE, SERUM     Status: Abnormal   Collection Time   04/01/11  8:06 PM      Component Value Range   Creatinine, Ser 1.28 (*) 0.50 - 1.10 (mg/dL)   GFR calc non Af Amer 47 (*) >90 (mL/min)   GFR calc Af Amer 54 (*) >90 (mL/min)  CARDIAC PANEL(CRET KIN+CKTOT+MB+TROPI)     Status: Normal   Collection Time   04/02/11  1:35 AM      Component Value Range   Total CK 125  7 - 177 (U/L)   CK, MB 1.9  0.3 - 4.0 (ng/mL)   Troponin I <0.30  <0.30 (ng/mL)   Relative Index 1.5  0.0 - 2.5    No intake or output data in the 24 hours ending 04/02/11 0834  EKG:  NSR, rate 76, LAD. No acute ST T wave changes.  ASSESSMENT AND PLAN:  1)  Chest pain:  No objective evidence of ischemia. No further ischemia work up in house.   2)  HTN:   Her blood pressure is not well controlled.  I will add Norvasc 5 mg first dose now and check the BP later today.  If it is better control she could go home.  Jeneen Rinks [facility] 04/02/2011 8:34 AM

## 2011-04-02 NOTE — Progress Notes (Signed)
Pt's BP 220/100 gave IV hydralazine 10mg . WIll recheck BP

## 2011-04-03 ENCOUNTER — Other Ambulatory Visit: Payer: Self-pay

## 2011-04-03 ENCOUNTER — Other Ambulatory Visit: Payer: Self-pay | Admitting: Family Medicine

## 2011-04-03 DIAGNOSIS — R079 Chest pain, unspecified: Secondary | ICD-10-CM

## 2011-04-03 DIAGNOSIS — E039 Hypothyroidism, unspecified: Secondary | ICD-10-CM

## 2011-04-03 DIAGNOSIS — I10 Essential (primary) hypertension: Secondary | ICD-10-CM

## 2011-04-03 DIAGNOSIS — N189 Chronic kidney disease, unspecified: Secondary | ICD-10-CM

## 2011-04-03 DIAGNOSIS — I5022 Chronic systolic (congestive) heart failure: Secondary | ICD-10-CM

## 2011-04-03 DIAGNOSIS — E785 Hyperlipidemia, unspecified: Secondary | ICD-10-CM

## 2011-04-03 DIAGNOSIS — G4733 Obstructive sleep apnea (adult) (pediatric): Secondary | ICD-10-CM

## 2011-04-03 MED ORDER — AMLODIPINE BESYLATE 5 MG PO TABS: 5.0000 mg | ORAL_TABLET | Freq: Every day | ORAL | Status: DC

## 2011-04-03 MED ORDER — HYDRALAZINE HCL 10 MG PO TABS: 10.0000 mg | ORAL_TABLET | ORAL | Status: DC | PRN

## 2011-04-03 MED ORDER — ATORVASTATIN CALCIUM 40 MG PO TABS: 40.0000 mg | ORAL_TABLET | Freq: Every day | ORAL | Status: DC

## 2011-04-03 MED ORDER — LORAZEPAM 1 MG PO TABS: 1.0000 mg | ORAL_TABLET | Freq: Every day | ORAL | Status: DC | PRN

## 2011-04-03 MED ORDER — NITROGLYCERIN 0.4 MG SL SUBL: 0.4000 mg | SUBLINGUAL_TABLET | SUBLINGUAL | Status: DC | PRN

## 2011-04-03 MED ORDER — POTASSIUM CHLORIDE CRYS ER 10 MEQ PO TBCR: 10.0000 meq | EXTENDED_RELEASE_TABLET | Freq: Every day | ORAL | Status: DC

## 2011-04-03 NOTE — Discharge Summary (Signed)
Discharge Summary   Patient ID: Chelsea Roberts,  MRN: [id number], DOB/AGE: 27-Oct-1955 56 y.o.  Admit date: 04/01/2011 Discharge date: 04/03/2011  Discharge Diagnoses Principal Problem:  *Chest pain Active Problems:  HTN (hypertension)  Hyperlipidemia  Chronic kidney disease  Systolic CHF, chronic  Obstructive sleep apnea   Allergies Allergies  Allergen Reactions  . Sulfa Antibiotics Swelling    Diagnostic Studies/Procedures  PA and Lateral Chest X-ray- 04/01/11  Comparison: 04/22/2009; 04/21/2009  Findings: Grossly unchanged enlarged cardiac silhouette and  mediastinal contours with persistently reduced lung volumes.  Overall improved aeration of the right mid lung with resolution of  ill-defined heterogeneous air space opacities. No new focal  airspace opacities, though note, examination is degraded secondary  to patient body habitus. No pneumothorax or pleural effusion.  Grossly unchanged bones.  IMPRESSION:  1. Persistently reduced lung volumes without acute  cardiopulmonary disease.  2. Resolution of right mid lung airspace opacities.  History of Present Illness  [name] is a 56yo AA female with PMHx significant for chronic systolic CHF, remote hx of PSVT (04/2009 in the setting of urosepsis), longstanding, uncontrolled HTN, HL, morbid obestiy, OSA, CKD (stage III) and hypothyroidism who was admitted on 04/01/11 with chest pain.   She reported experiencing chest pain on 03/11 around 1700 while at work described as sharp, L-sided, lasting for about 1.5 hours, rated at a 9/10 with associated shortness of breath. She took ibuprofen and vicodin which provided some relief. The following morning, her chest pain had resolved and she went to work. Around 1200, the pain recurred rated at a 6/10 and described as constant with associated face "tingling." Her BP was checked in the office and was indeterminate. EMS was called and SBP was found to be 220. She reported  increased stress at work, and DOE x the past few weeks. She reported compliance with daily ASA, and had taken her morning antihypertensives. She denied all other symptoms.   She was found to be hypertensive in the ED with BP at 225/107 and 205/106 an hour apart. VSS otherwise. EKG without ischemic changes. POC TnI WNL. CXR with persistently low lung volumes, but no evidence of acute cardiopulmonary process. She was given NTG SL x 3 with pain relief to 2/10.  Hospital Course   Given her cardiac risk factors and symptoms concerning for unstable angina, she was admitted for ACS rule out. She was found to be mildly hypokalemic, which was successfully supplemented. She was continued on outpatient medications. Her cardiac enzymes returned neg x 3, and there were no acute events overnight; however, her BP remained markedly elevated with SBP > 170. She effectively ruled out from an ACS standpoint, and no further ischemic work-up was pursued. The extent of her admission included medication titration in an attempt to reduce her BP. Norvasc was added with some control SBP ranging from 160-180. At one point, her BP elevated to 220/100 and she was given Hydralazine IV with appropriate BP response.   Today, she is stable at baseline and will be discharged home. She will continue Norvasc and was prescribed Hydralazine PO PRN for SBP > 170. She was instructed to keep a BP log and follow-up with her PCP. Additionally, her lipid panel returned with LDL at 149. She will be discharged on Lipitor. She will also follow-up with [facility] for outpatient sleep study. She has CXR findings and body habitus consistent with OHS/OSA. This will be further evaluated, and likely contributes to her difficult to control HTN. This information  has been clearly outlined for her in the discharge AVS.   Discharge Vitals:  Blood pressure 173/95, pulse 79, temperature 98.5 F (36.9 C), temperature source Oral, resp. rate 20, height 5\' 7"   (1.702 m), weight 116.302 kg (256 lb 6.4 oz), SpO2 96.00%.   Weight change: -3.598 kg (-7 lb 14.9 oz)  Labs: Recent Labs  [facility] 04/02/11 0828 04/01/11 2006   WBC 7.9 7.6   HGB 14.0 13.6   HCT 42.6 40.6   MCV 82.4 81.2   PLT 185 181    Lab 04/02/11 0828 04/01/11 2006 04/01/11 1454  NA 142 -- 140  K 3.7 -- 3.3*  CL 105 -- 103  CO2 25 -- 27  BUN 20 -- 15  CREATININE 1.22* 1.28* 1.27*  CALCIUM 9.3 -- 9.1  PROT -- -- 6.7  BILITOT -- -- 0.4  ALKPHOS -- -- 67  ALT -- -- 36*  AST -- -- 28  AMYLASE -- -- --  LIPASE -- -- --  GLUCOSE 203* -- 92   Recent Labs  [facility] 04/02/11 0825 04/02/11 0135 04/01/11 2005   CKTOTAL 125 125 125   CKMB 2.1 1.9 1.9   CKMBINDEX -- -- --   TROPONINI <0.30 <0.30 <0.30   Recent Labs  Basename 04/02/11 0828   CHOL 233*   HDL 39*   LDLCALC 149*   TRIG 223*   CHOLHDL 6.0   LDLDIRECT --    Basename 04/01/11 2006  TSH 2.911  T4TOTAL --  T3FREE --  THYROIDAB --    Disposition:  Discharge Orders    Future Orders Please Complete By Expires   Polysomnography 4 or more parameters   04/02/12   Questions: Responses:   Where should this test be performed: Other     Follow-up Information    Schedule an appointment as soon as possible for a visit with [facility] MD. (For follow-up and blood pressure management after this hospitalization. )    Contact information:   [place] [phone]       Please follow up. [facility] will call you with an appointment. You are to have a sleep study to evaluate for sleep apnea. )         Discharge Medications:  Medication List  As of 04/03/2011 12:24 PM   START taking these medications         amLODipine 5 MG tablet   Commonly known as: NORVASC   Take 1 tablet (5 mg total) by mouth daily.      atorvastatin 40 MG tablet   Commonly known as: LIPITOR   Take 1 tablet (40 mg total) by mouth daily at 6 PM.      hydrALAZINE 10 MG  tablet   Commonly known as: APRESOLINE   Take 1 tablet (10 mg total) by mouth as needed (for systolic blood pressure greater than 170).      nitroGLYCERIN 0.4 MG SL tablet   Commonly known as: NITROSTAT   Place 1 tablet (0.4 mg total) under the tongue every 5 (five) minutes x 3 doses as needed for chest pain.         CONTINUE taking these medications         aspirin EC 81 MG tablet      carvedilol 25 MG tablet   Commonly known as: COREG      cetirizine 10 MG tablet   Commonly known as: ZYRTEC      febuxostat 40 MG tablet  Commonly known as: ULORIC      fluticasone 50 MCG/ACT nasal spray   Commonly known as: FLONASE      furosemide 20 MG tablet   Commonly known as: LASIX      ibuprofen 200 MG tablet   Commonly known as: ADVIL,MOTRIN      levothyroxine 25 MCG tablet   Commonly known as: SYNTHROID, LEVOTHROID      LORazepam 1 MG tablet   Commonly known as: ATIVAN      mulitivitamin with minerals Tabs      olmesartan 40 MG tablet   Commonly known as: BENICAR          Where to get your medications    These are the prescriptions that you need to pick up. We sent them to a specific pharmacy, so you will need to go there to get them.   [id number] - [place], [place] - [address]    [id number] [place] [id number]    Phone: [phone]        amLODipine 5 MG tablet   atorvastatin 40 MG tablet   hydrALAZINE 10 MG tablet   nitroGLYCERIN 0.4 MG SL tablet           Outstanding Labs/Studies: None  Duration of Discharge Encounter: Greater than 30 minutes including physician time.  Signed, R. Valeria Batman, PA-C 04/03/2011, 12:24 PM  Patient seen and examined.  Plan as discussed in my rounding note for today and outlined above. Minus Breeding  04/03/2011  1:37 PM

## 2011-04-03 NOTE — Discharge Instructions (Signed)
PLEASE REMEMBER TO BRING ALL OF YOUR MEDICATIONS TO EACH OF YOUR FOLLOW-UP OFFICE VISITS.  PLEASE KEEP A BLOOD PRESSURE JOURNAL AND FOLLOW-UP WITH YOUR PRIMARY DOCTOR FOR BLOOD PRESSURE MANAGEMENT.

## 2011-04-03 NOTE — Progress Notes (Signed)
UR Completed. Simmons, Duan Scharnhorst F [phone]  

## 2011-04-03 NOTE — Progress Notes (Signed)
Pt discharge instructions and patient education complete. IV site d/c. Site WNL. No s/s of distress. Pt had no further questions. D/C to home. Marko Plume

## 2011-04-03 NOTE — Progress Notes (Signed)
   SUBJECTIVE:  She denies any further chest pain and she wants to go home.   PHYSICAL EXAM Filed Vitals:   04/02/11 1752 04/03/11 0139 04/03/11 0634 04/03/11 0927  BP: 167/108 181/95 198/103 173/95  Pulse:  79 84 79  Temp:   98.5 F (36.9 C)   TempSrc:   Oral   Resp:  20 20   Height:      Weight:   116.302 kg (256 lb 6.4 oz)   SpO2:  95% 96%    General:  No distress Lungs: Clear Heart:  RRR, no rub, no murmur Abdomen:  Positive bowel sounds, no rebound no guarding Extremities:  No edema.  LABS: Lab Results  Component Value Date   CKTOTAL 125 04/02/2011   CKMB 2.1 04/02/2011   TROPONINI <0.30 04/02/2011   No results found for this or any previous visit (from the past 24 hour(s)).  Intake/Output Summary (Last 24 hours) at 04/03/11 1017 Last data filed at 04/03/11 0800  Gross per 24 hour  Intake   1320 ml  Output    600 ml  Net    720 ml    ASSESSMENT AND PLAN:  1)  Chest pain:  Ruled out.  No objective evidence of ischemia.  No further work up is indicated at this time.  2)  Hypertension:  BP is still elevated.  I added Norvasc yesterday.  I talked to her about switching to labetalol.  However, she says she took this before and she "couldn't function."  She can go home with the newly initiated Norvasc and hydralazine 10 mg prn SBP greater than 170.  She needs other plans as below.  I described to her how to keep a blood pressure diary.  She will follow with her primary MD for further blood pressure management.  3)  Sleep apnea:  This is contributing to her difficult to control blood pressure.  She will need an out patient appt with one of our sleep MDs.  Also, she needs life style changes to include weight loss  Minus Breeding 04/03/2011 10:17 AM

## 2011-04-05 ENCOUNTER — Ambulatory Visit (INDEPENDENT_AMBULATORY_CARE_PROVIDER_SITE_OTHER): Payer: BC Managed Care – PPO | Admitting: Family Medicine

## 2011-04-05 VITALS — BP 194/110 | HR 80 | Temp 97.7°F | Resp 16 | Ht 65.5 in | Wt 258.0 lb

## 2011-04-05 DIAGNOSIS — E039 Hypothyroidism, unspecified: Secondary | ICD-10-CM

## 2011-04-05 DIAGNOSIS — E785 Hyperlipidemia, unspecified: Secondary | ICD-10-CM

## 2011-04-05 DIAGNOSIS — N189 Chronic kidney disease, unspecified: Secondary | ICD-10-CM

## 2011-04-05 DIAGNOSIS — G4733 Obstructive sleep apnea (adult) (pediatric): Secondary | ICD-10-CM

## 2011-04-05 DIAGNOSIS — I10 Essential (primary) hypertension: Secondary | ICD-10-CM

## 2011-04-05 DIAGNOSIS — I5022 Chronic systolic (congestive) heart failure: Secondary | ICD-10-CM

## 2011-04-05 DIAGNOSIS — F411 Generalized anxiety disorder: Secondary | ICD-10-CM

## 2011-04-05 MED ORDER — LEVOTHYROXINE SODIUM 25 MCG PO TABS: 25.0000 ug | ORAL_TABLET | Freq: Every day | ORAL | Status: AC

## 2011-04-05 MED ORDER — SERTRALINE HCL 50 MG PO TABS: 50.0000 mg | ORAL_TABLET | Freq: Every day | ORAL | Status: DC

## 2011-04-05 MED ORDER — OLMESARTAN MEDOXOMIL 40 MG PO TABS: 40.0000 mg | ORAL_TABLET | Freq: Two times a day (BID) | ORAL | Status: DC

## 2011-04-05 MED ORDER — LORAZEPAM 1 MG PO TABS: 1.0000 mg | ORAL_TABLET | Freq: Two times a day (BID) | ORAL | Status: AC | PRN

## 2011-04-05 NOTE — Progress Notes (Signed)
  Subjective:    Patient ID: Chelsea Roberts, female    DOB: 27-Jul-1955, 56 y.o.   MRN: [id number]  HPI Patient presents in follow up of emergency room visit secondary to malignant hypertension.  Dr. Percival Spanish saw patient in the emergency room and adjusted medications last Thursday. See ED notes  Patient states she is compliant with medications but not C PAP.  Patient has been finding the CPAP mask difficult to tolerate  Significant stressors at work  Review of Systems     Objective:   Physical Exam  Constitutional: She appears well-developed.  Neck: Neck supple. No JVD present. Carotid bruit is not present. No thyromegaly present.  Cardiovascular: Normal rate, regular rhythm and normal heart sounds.   Pulmonary/Chest: Effort normal and breath sounds normal.  Neurological: She is alert.       Assessment & Plan:   1. HTN (hypertension)   2. Hyperlipidemia   3. Chronic kidney disease   4. Hypothyroidism   5. Obstructive sleep apnea   6. Systolic CHF, chronic    Patient not currently using her CPAP which is contributing to her elevated blood pressure readings. She will go to [place] and pick up nasal pillars.  She believes she does not need a prescription but will call if she needs an RX or paperwork signed so it can be follow through her insurance.   Zoloft 50 mg daily Ativan 1 mg po BID prn  Patient to follow up with me in 3 weeks for BP recheck  FMLA completed Anticipatory guidance

## 2011-04-17 ENCOUNTER — Ambulatory Visit (INDEPENDENT_AMBULATORY_CARE_PROVIDER_SITE_OTHER): Payer: BC Managed Care – PPO | Admitting: Family Medicine

## 2011-04-17 VITALS — BP 138/87 | HR 69 | Temp 98.7°F | Resp 16 | Ht 67.0 in | Wt 255.0 lb

## 2011-04-17 DIAGNOSIS — R7309 Other abnormal glucose: Secondary | ICD-10-CM

## 2011-04-17 DIAGNOSIS — R42 Dizziness and giddiness: Secondary | ICD-10-CM

## 2011-04-17 DIAGNOSIS — R7302 Impaired glucose tolerance (oral): Secondary | ICD-10-CM

## 2011-04-17 LAB — BASIC METABOLIC PANEL
BUN: 28 mg/dL — ABNORMAL HIGH (ref 6–23)
CO2: 24 mEq/L (ref 19–32)
Calcium: 9.7 mg/dL (ref 8.4–10.5)
Glucose, Bld: 106 mg/dL — ABNORMAL HIGH (ref 70–99)
Sodium: 140 mEq/L (ref 135–145)

## 2011-04-17 MED ORDER — AMLODIPINE BESYLATE 10 MG PO TABS: 10.0000 mg | ORAL_TABLET | Freq: Every day | ORAL | Status: DC

## 2011-04-17 NOTE — Progress Notes (Signed)
  Subjective:    Patient ID: Chelsea Roberts, female    DOB: 1955/09/24, 56 y.o.   MRN: [id number]  HPI 56 yo female with HTN and systolic CHF and sleep apnea.  ED visit 3/12 for chest pain and HTN - meds adjusted by Dr. Percival Spanish.  Seen here in follow-up 3/16.  Determined she was not using her cpap.  Advised to get nasal pillars to see if more comfortable than the mask.    Today here with complaint of elevated BP and dizziness.  Felt great when she went to work.  Stood up and went to copy something.  While waiting got dizzy and stumbled.  BP at work was 158/110.  They insisted she come in as she was at work when she ended up at ED last time.  Dizziness lasted about 3 minutes.  Briefly felt dizzy again when arriving here.  Now better.  BP okay here.  Did take a second norvasc 5 at work, about 20 minutes PTA.   Feels okay now.  Ears feel full.    Still not using CPAP.  Hasn't looked into nasal prongs yet.    High stress right now.  Zoloft seems to be helping some as well at ativan.     Review of Systems Negative except as per HPI     Objective:   Physical Exam  Constitutional: She appears well-developed and well-nourished.  Cardiovascular: Normal rate, regular rhythm, normal heart sounds and intact distal pulses.   No murmur heard. Pulmonary/Chest: Effort normal and breath sounds normal.  Neurological: She is alert.  Skin: Skin is warm and dry.      Results for orders placed in visit on 04/17/11  GLUCOSE, POCT (MANUAL RESULT ENTRY)      Component Value Range   POC Glucose 97         Assessment & Plan:  Dizziness Hypertension  Increase norvasc to 10 mg daily

## 2011-04-24 ENCOUNTER — Institutional Professional Consult (permissible substitution): Payer: BC Managed Care – PPO | Admitting: Pulmonary Disease

## 2011-06-23 ENCOUNTER — Ambulatory Visit (INDEPENDENT_AMBULATORY_CARE_PROVIDER_SITE_OTHER): Payer: BC Managed Care – PPO | Admitting: Family Medicine

## 2011-06-23 VITALS — BP 162/112 | HR 76 | Temp 98.0°F | Resp 16

## 2011-06-23 DIAGNOSIS — M109 Gout, unspecified: Secondary | ICD-10-CM

## 2011-06-23 DIAGNOSIS — M79673 Pain in unspecified foot: Secondary | ICD-10-CM

## 2011-06-23 MED ORDER — METHYLPREDNISOLONE ACETATE 80 MG/ML IJ SUSP
80.0000 mg | Freq: Once | INTRAMUSCULAR | Status: AC
  Administered 2011-06-23: 80 mg via INTRAMUSCULAR

## 2011-06-23 MED ORDER — COLCHICINE 0.6 MG PO TABS: 0.6000 mg | ORAL_TABLET | Freq: Every day | ORAL | Status: DC

## 2011-06-23 NOTE — Patient Instructions (Signed)
Gout Gout is an inflammatory condition (arthritis) caused by a buildup of uric acid crystals in the joints. Uric acid is a chemical that is normally present in the blood. Under some circumstances, uric acid can form into crystals in your joints. This causes joint redness, soreness, and swelling (inflammation). Repeat attacks are common. Over time, uric acid crystals can form into masses (tophi) near a joint, causing disfigurement. Gout is treatable and often preventable. CAUSES  The disease begins with elevated levels of uric acid in the blood. Uric acid is produced by your body when it breaks down a naturally found substance called purines. This also happens when you eat certain foods such as meats and fish. Causes of an elevated uric acid level include:  Being passed down from parent to child (heredity).   Diseases that cause increased uric acid production (obesity, psoriasis, some cancers).   Excessive alcohol use.   Diet, especially diets rich in meat and seafood.   Medicines, including certain cancer-fighting drugs (chemotherapy), diuretics, and aspirin.   Chronic kidney disease. The kidneys are no longer able to remove uric acid well.   Problems with metabolism.  Conditions strongly associated with gout include:  Obesity.   High blood pressure.   High cholesterol.   Diabetes.  Not everyone with elevated uric acid levels gets gout. It is not understood why some people get gout and others do not. Surgery, joint injury, and eating too much of certain foods are some of the factors that can lead to gout. SYMPTOMS   An attack of gout comes on quickly. It causes intense pain with redness, swelling, and warmth in a joint.   Fever can occur.   Often, only one joint is involved. Certain joints are more commonly involved:   Base of the big toe.   Knee.   Ankle.   Wrist.   Finger.  Without treatment, an attack usually goes away in a few days to weeks. Between attacks, you  usually will not have symptoms, which is different from many other forms of arthritis. DIAGNOSIS  Your caregiver will suspect gout based on your symptoms and exam. Removal of fluid from the joint (arthrocentesis) is done to check for uric acid crystals. Your caregiver will give you a medicine that numbs the area (local anesthetic) and use a needle to remove joint fluid for exam. Gout is confirmed when uric acid crystals are seen in joint fluid, using a special microscope. Sometimes, blood, urine, and X-ray tests are also used. TREATMENT  There are 2 phases to gout treatment: treating the sudden onset (acute) attack and preventing attacks (prophylaxis). Treatment of an Acute Attack  Medicines are used. These include anti-inflammatory medicines or steroid medicines.   An injection of steroid medicine into the affected joint is sometimes necessary.   The painful joint is rested. Movement can worsen the arthritis.   You may use warm or cold treatments on painful joints, depending which works best for you.   Discuss the use of coffee, vitamin C, or cherries with your caregiver. These may be helpful treatment options.  Treatment to Prevent Attacks After the acute attack subsides, your caregiver may advise prophylactic medicine. These medicines either help your kidneys eliminate uric acid from your body or decrease your uric acid production. You may need to stay on these medicines for a very long time. The early phase of treatment with prophylactic medicine can be associated with an increase in acute gout attacks. For this reason, during the first few months   of treatment, your caregiver may also advise you to take medicines usually used for acute gout treatment. Be sure you understand your caregiver's directions. You should also discuss dietary treatment with your caregiver. Certain foods such as meats and fish can increase uric acid levels. Other foods such as dairy can decrease levels. Your caregiver  can give you a list of foods to avoid. HOME CARE INSTRUCTIONS   Do not take aspirin to relieve pain. This raises uric acid levels.   Only take over-the-counter or prescription medicines for pain, discomfort, or fever as directed by your caregiver.   Rest the joint as much as possible. When in bed, keep sheets and blankets off painful areas.   Keep the affected joint raised (elevated).   Use crutches if the painful joint is in your leg.   Drink enough water and fluids to keep your urine clear or pale yellow. This helps your body get rid of uric acid. Do not drink alcoholic beverages. They slow the passage of uric acid.   Follow your caregiver's dietary instructions. Pay careful attention to the amount of protein you eat. Your daily diet should emphasize fruits, vegetables, whole grains, and fat-free or low-fat milk products.   Maintain a healthy body weight.  SEEK MEDICAL CARE IF:   You have an oral temperature above 102 F (38.9 C).   You develop diarrhea, vomiting, or any side effects from medicines.   You do not feel better in 24 hours, or you are getting worse.  SEEK IMMEDIATE MEDICAL CARE IF:   Your joint becomes suddenly more tender and you have:   Chills.   An oral temperature above 102 F (38.9 C), not controlled by medicine.  MAKE SURE YOU:   Understand these instructions.   Will watch your condition.   Will get help right away if you are not doing well or get worse.  Document Released: 01/04/2000 Document Revised: 12/26/2010 Document Reviewed: 04/16/2009 [employer] Patient Information 2012 [employer]. 

## 2011-06-24 LAB — COMPREHENSIVE METABOLIC PANEL
ALT: 40 U/L — ABNORMAL HIGH (ref 0–35)
AST: 35 U/L (ref 0–37)
Albumin: 4.3 g/dL (ref 3.5–5.2)
Alkaline Phosphatase: 71 U/L (ref 39–117)
BUN: 16 mg/dL (ref 6–23)
CO2: 26 mEq/L (ref 19–32)
Calcium: 9.6 mg/dL (ref 8.4–10.5)
Chloride: 104 mEq/L (ref 96–112)
Creat: 1.33 mg/dL — ABNORMAL HIGH (ref 0.50–1.10)
Glucose, Bld: 95 mg/dL (ref 70–99)
Potassium: 3.7 mEq/L (ref 3.5–5.3)
Sodium: 143 mEq/L (ref 135–145)
Total Bilirubin: 0.5 mg/dL (ref 0.3–1.2)
Total Protein: 6.9 g/dL (ref 6.0–8.3)

## 2011-06-24 LAB — URIC ACID: Uric Acid, Serum: 6.2 mg/dL (ref 2.4–7.0)

## 2011-06-24 NOTE — Progress Notes (Signed)
56 yo woman with 2-3 days of progressive left foot and ankle pain, similar to that diagnosed as gout in the past.  She ran out of colchrys and prednisone which she has used before to good effect  O:  Left great toe and posterior lateral malleolus are tender BP recheck 150/100.   Heart:  Reg without murmur Chest clear  A:  Acute gout  P:  depomedrol 80 IM, colchrys refill. Coached on not forgetting BP meds Patient Instructions  Gout Gout is an inflammatory condition (arthritis) caused by a buildup of uric acid crystals in the joints. Uric acid is a chemical that is normally present in the blood. Under some circumstances, uric acid can form into crystals in your joints. This causes joint redness, soreness, and swelling (inflammation). Repeat attacks are common. Over time, uric acid crystals can form into masses (tophi) near a joint, causing disfigurement. Gout is treatable and often preventable. CAUSES  The disease begins with elevated levels of uric acid in the blood. Uric acid is produced by your body when it breaks down a naturally found substance called purines. This also happens when you eat certain foods such as meats and fish. Causes of an elevated uric acid level include:  Being passed down from parent to child (heredity).   Diseases that cause increased uric acid production (obesity, psoriasis, some cancers).   Excessive alcohol use.   Diet, especially diets rich in meat and seafood.   Medicines, including certain cancer-fighting drugs (chemotherapy), diuretics, and aspirin.   Chronic kidney disease. The kidneys are no longer able to remove uric acid well.   Problems with metabolism.  Conditions strongly associated with gout include:  Obesity.   High blood pressure.   High cholesterol.   Diabetes.  Not everyone with elevated uric acid levels gets gout. It is not understood why some people get gout and others do not. Surgery, joint injury, and eating too much of certain  foods are some of the factors that can lead to gout. SYMPTOMS   An attack of gout comes on quickly. It causes intense pain with redness, swelling, and warmth in a joint.   Fever can occur.   Often, only one joint is involved. Certain joints are more commonly involved:   Base of the big toe.   Knee.   Ankle.   Wrist.   Finger.  Without treatment, an attack usually goes away in a few days to weeks. Between attacks, you usually will not have symptoms, which is different from many other forms of arthritis. DIAGNOSIS  Your caregiver will suspect gout based on your symptoms and exam. Removal of fluid from the joint (arthrocentesis) is done to check for uric acid crystals. Your caregiver will give you a medicine that numbs the area (local anesthetic) and use a needle to remove joint fluid for exam. Gout is confirmed when uric acid crystals are seen in joint fluid, using a special microscope. Sometimes, blood, urine, and X-ray tests are also used. TREATMENT  There are 2 phases to gout treatment: treating the sudden onset (acute) attack and preventing attacks (prophylaxis). Treatment of an Acute Attack  Medicines are used. These include anti-inflammatory medicines or steroid medicines.   An injection of steroid medicine into the affected joint is sometimes necessary.   The painful joint is rested. Movement can worsen the arthritis.   You may use warm or cold treatments on painful joints, depending which works best for you.   Discuss the use of coffee, vitamin C,  or cherries with your caregiver. These may be helpful treatment options.  Treatment to Prevent Attacks After the acute attack subsides, your caregiver may advise prophylactic medicine. These medicines either help your kidneys eliminate uric acid from your body or decrease your uric acid production. You may need to stay on these medicines for a very long time. The early phase of treatment with prophylactic medicine can be associated  with an increase in acute gout attacks. For this reason, during the first few months of treatment, your caregiver may also advise you to take medicines usually used for acute gout treatment. Be sure you understand your caregiver's directions. You should also discuss dietary treatment with your caregiver. Certain foods such as meats and fish can increase uric acid levels. Other foods such as dairy can decrease levels. Your caregiver can give you a list of foods to avoid. HOME CARE INSTRUCTIONS   Do not take aspirin to relieve pain. This raises uric acid levels.   Only take over-the-counter or prescription medicines for pain, discomfort, or fever as directed by your caregiver.   Rest the joint as much as possible. When in bed, keep sheets and blankets off painful areas.   Keep the affected joint raised (elevated).   Use crutches if the painful joint is in your leg.   Drink enough water and fluids to keep your urine clear or pale yellow. This helps your body get rid of uric acid. Do not drink alcoholic beverages. They slow the passage of uric acid.   Follow your caregiver's dietary instructions. Pay careful attention to the amount of protein you eat. Your daily diet should emphasize fruits, vegetables, whole grains, and fat-free or low-fat milk products.   Maintain a healthy body weight.  SEEK MEDICAL CARE IF:   You have an oral temperature above 102 F (38.9 C).   You develop diarrhea, vomiting, or any side effects from medicines.   You do not feel better in 24 hours, or you are getting worse.  SEEK IMMEDIATE MEDICAL CARE IF:   Your joint becomes suddenly more tender and you have:   Chills.   An oral temperature above 102 F (38.9 C), not controlled by medicine.  MAKE SURE YOU:   Understand these instructions.   Will watch your condition.   Will get help right away if you are not doing well or get worse.  Document Released: 01/04/2000 Document Revised: 12/26/2010 Document  Reviewed: 04/16/2009 [facility] Patient Information 2012 [place].

## 2011-07-04 ENCOUNTER — Other Ambulatory Visit: Payer: Self-pay | Admitting: Physician Assistant

## 2011-07-06 NOTE — Telephone Encounter (Signed)
Patient was recently seen with gout.  If another attack occurs, consider D/C this medication.

## 2011-11-05 ENCOUNTER — Ambulatory Visit (INDEPENDENT_AMBULATORY_CARE_PROVIDER_SITE_OTHER): Payer: BC Managed Care – PPO | Admitting: Family Medicine

## 2011-11-05 VITALS — BP 190/120 | HR 74 | Temp 99.2°F | Resp 20 | Ht 67.0 in | Wt 256.0 lb

## 2011-11-05 DIAGNOSIS — M109 Gout, unspecified: Secondary | ICD-10-CM | POA: Insufficient documentation

## 2011-11-05 DIAGNOSIS — I10 Essential (primary) hypertension: Secondary | ICD-10-CM

## 2011-11-05 DIAGNOSIS — M79673 Pain in unspecified foot: Secondary | ICD-10-CM

## 2011-11-05 LAB — POCT SEDIMENTATION RATE: POCT SED RATE: 30 mm/hr — AB (ref 0–22)

## 2011-11-05 MED ORDER — PREDNISONE 20 MG PO TABS: 40.0000 mg | ORAL_TABLET | Freq: Every day | ORAL | Status: DC

## 2011-11-05 MED ORDER — FEBUXOSTAT 40 MG PO TABS: ORAL_TABLET | ORAL | Status: DC

## 2011-11-05 MED ORDER — COLCHICINE 0.6 MG PO TABS: 0.6000 mg | ORAL_TABLET | Freq: Every day | ORAL | Status: DC

## 2011-11-05 MED ORDER — LABETALOL HCL 100 MG PO TABS: 100.0000 mg | ORAL_TABLET | Freq: Two times a day (BID) | ORAL | Status: DC

## 2011-11-05 MED ORDER — METHYLPREDNISOLONE ACETATE 80 MG/ML IJ SUSP
80.0000 mg | Freq: Once | INTRAMUSCULAR | Status: AC
  Administered 2011-11-05: 80 mg via INTRAMUSCULAR

## 2011-11-05 NOTE — Patient Instructions (Addendum)
Gout Gout is an inflammatory condition (arthritis) caused by a buildup of uric acid crystals in the joints. Uric acid is a chemical that is normally present in the blood. Under some circumstances, uric acid can form into crystals in your joints. This causes joint redness, soreness, and swelling (inflammation). Repeat attacks are common. Over time, uric acid crystals can form into masses (tophi) near a joint, causing disfigurement. Gout is treatable and often preventable. CAUSES  The disease begins with elevated levels of uric acid in the blood. Uric acid is produced by your body when it breaks down a naturally found substance called purines. This also happens when you eat certain foods such as meats and fish. Causes of an elevated uric acid level include:  Being passed down from parent to child (heredity).  Diseases that cause increased uric acid production (obesity, psoriasis, some cancers).  Excessive alcohol use.  Diet, especially diets rich in meat and seafood.  Medicines, including certain cancer-fighting drugs (chemotherapy), diuretics, and aspirin.  Chronic kidney disease. The kidneys are no longer able to remove uric acid well.  Problems with metabolism. Conditions strongly associated with gout include:  Obesity.  High blood pressure.  High cholesterol.  Diabetes. Not everyone with elevated uric acid levels gets gout. It is not understood why some people get gout and others do not. Surgery, joint injury, and eating too much of certain foods are some of the factors that can lead to gout. SYMPTOMS   An attack of gout comes on quickly. It causes intense pain with redness, swelling, and warmth in a joint.  Fever can occur.  Often, only one joint is involved. Certain joints are more commonly involved:  Base of the big toe.  Knee.  Ankle.  Wrist.  Finger. Without treatment, an attack usually goes away in a few days to weeks. Between attacks, you usually will not have  symptoms, which is different from many other forms of arthritis. DIAGNOSIS  Your caregiver will suspect gout based on your symptoms and exam. Removal of fluid from the joint (arthrocentesis) is done to check for uric acid crystals. Your caregiver will give you a medicine that numbs the area (local anesthetic) and use a needle to remove joint fluid for exam. Gout is confirmed when uric acid crystals are seen in joint fluid, using a special microscope. Sometimes, blood, urine, and X-ray tests are also used. TREATMENT  There are 2 phases to gout treatment: treating the sudden onset (acute) attack and preventing attacks (prophylaxis). Treatment of an Acute Attack  Medicines are used. These include anti-inflammatory medicines or steroid medicines.  An injection of steroid medicine into the affected joint is sometimes necessary.  The painful joint is rested. Movement can worsen the arthritis.  You may use warm or cold treatments on painful joints, depending which works best for you.  Discuss the use of coffee, vitamin C, or cherries with your caregiver. These may be helpful treatment options. Treatment to Prevent Attacks After the acute attack subsides, your caregiver may advise prophylactic medicine. These medicines either help your kidneys eliminate uric acid from your body or decrease your uric acid production. You may need to stay on these medicines for a very long time. The early phase of treatment with prophylactic medicine can be associated with an increase in acute gout attacks. For this reason, during the first few months of treatment, your caregiver may also advise you to take medicines usually used for acute gout treatment. Be sure you understand your caregiver's directions.   You should also discuss dietary treatment with your caregiver. Certain foods such as meats and fish can increase uric acid levels. Other foods such as dairy can decrease levels. Your caregiver can give you a list of foods  to avoid. HOME CARE INSTRUCTIONS   Do not take aspirin to relieve pain. This raises uric acid levels.  Only take over-the-counter or prescription medicines for pain, discomfort, or fever as directed by your caregiver.  Rest the joint as much as possible. When in bed, keep sheets and blankets off painful areas.  Keep the affected joint raised (elevated).  Use crutches if the painful joint is in your leg.  Drink enough water and fluids to keep your urine clear or pale yellow. This helps your body get rid of uric acid. Do not drink alcoholic beverages. They slow the passage of uric acid.  Follow your caregiver's dietary instructions. Pay careful attention to the amount of protein you eat. Your daily diet should emphasize fruits, vegetables, whole grains, and fat-free or low-fat milk products.  Maintain a healthy body weight. SEEK MEDICAL CARE IF:   You have an oral temperature above 102 F (38.9 C).  You develop diarrhea, vomiting, or any side effects from medicines.  You do not feel better in 24 hours, or you are getting worse. SEEK IMMEDIATE MEDICAL CARE IF:   Your joint becomes suddenly more tender and you have:  Chills.  An oral temperature above 102 F (38.9 C), not controlled by medicine. MAKE SURE YOU:   Understand these instructions.  Will watch your condition.  Will get help right away if you are not doing well or get worse. Document Released: 01/04/2000 Document Revised: 03/31/2011 Document Reviewed: 04/16/2009 [employer] Patient Information 2013 [employer].    

## 2011-11-05 NOTE — Progress Notes (Signed)
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  Pain is worsening.  She took colcrys and vicodin.  Objective:  NAD Left great toe MTP joint is swollen and tender to touch. Left plantar foot also tender. Right dorsal wrist nodule c/w ganglion cyst Right ring finger and left index finger PIP joints with subq nodules  Assessment:  Gout, hypertension uncontrolled  Plan:  1. Gout  methylPREDNISolone acetate (DEPO-MEDROL) injection 80 mg, predniSONE (DELTASONE) 20 MG tablet, colchicine 0.6 MG tablet, febuxostat (ULORIC) 40 MG tablet, Comprehensive metabolic panel, Uric Acid, POCT SEDIMENTATION RATE  2. Foot pain  colchicine 0.6 MG tablet  3. Hypertension  labetalol (NORMODYNE) 100 MG tablet     Recheck Wednesday.

## 2011-11-06 LAB — COMPREHENSIVE METABOLIC PANEL
ALT: 31 U/L (ref 0–35)
AST: 28 U/L (ref 0–37)
Albumin: 4.2 g/dL (ref 3.5–5.2)
Alkaline Phosphatase: 79 U/L (ref 39–117)
BUN: 19 mg/dL (ref 6–23)
CO2: 28 mEq/L (ref 19–32)
Calcium: 9.4 mg/dL (ref 8.4–10.5)
Chloride: 104 mEq/L (ref 96–112)
Creat: 1.19 mg/dL — ABNORMAL HIGH (ref 0.50–1.10)
Glucose, Bld: 144 mg/dL — ABNORMAL HIGH (ref 70–99)
Potassium: 3.8 mEq/L (ref 3.5–5.3)
Sodium: 141 mEq/L (ref 135–145)
Total Bilirubin: 0.6 mg/dL (ref 0.3–1.2)
Total Protein: 7 g/dL (ref 6.0–8.3)

## 2011-11-06 LAB — URIC ACID: Uric Acid, Serum: 5.6 mg/dL (ref 2.4–7.0)

## 2011-11-17 LAB — HM MAMMOGRAPHY

## 2011-11-20 ENCOUNTER — Other Ambulatory Visit: Payer: Self-pay | Admitting: Radiology

## 2011-11-20 DIAGNOSIS — D249 Benign neoplasm of unspecified breast: Secondary | ICD-10-CM | POA: Insufficient documentation

## 2011-12-02 ENCOUNTER — Encounter (INDEPENDENT_AMBULATORY_CARE_PROVIDER_SITE_OTHER): Payer: Self-pay

## 2011-12-03 ENCOUNTER — Encounter (INDEPENDENT_AMBULATORY_CARE_PROVIDER_SITE_OTHER): Payer: Self-pay | Admitting: Surgery

## 2011-12-03 ENCOUNTER — Ambulatory Visit (INDEPENDENT_AMBULATORY_CARE_PROVIDER_SITE_OTHER): Payer: BC Managed Care – PPO | Admitting: Surgery

## 2011-12-03 VITALS — BP 142/84 | HR 74 | Temp 97.8°F | Resp 16 | Ht 67.0 in | Wt 251.2 lb

## 2011-12-03 DIAGNOSIS — D249 Benign neoplasm of unspecified breast: Secondary | ICD-10-CM

## 2011-12-03 NOTE — Progress Notes (Signed)
Patient ID: Chelsea Roberts, female   DOB: 01/14/1956, 56 y.o.   MRN: [id number]  Chief Complaint  Patient presents with  . 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 female.  She recently had a mammogram a small abnormality was found in the right breast 2:30 position. A needle core biopsy was done and this appears to be an intraductal papilloma. Surgical consultation was requested. The patient is asymptomatic. This is an incidental finding a mammogram. She is having no breast symptoms. She has negative family history for breast cancer. HPI  Past Medical History  Diagnosis Date  . Gout   . Hypertension   . Diabetes mellitus borderline  . Chronic kidney disease   . Hyperlipidemia   . OSA (obstructive sleep apnea)   . Systolic CHF, chronic   . Gout   . Breast mass     Past Surgical History  Procedure Date  . Abdominal hysterectomy 1999    partial    Family History  Problem Relation Age of Onset  . Heart failure Sister   . Kidney disease Sister   . Hypertension Mother   . Hypertension Sister   . Diabetes type II Sister   . Cancer Father   . Cancer Maternal Aunt     ovarian  . Cancer Maternal Grandmother     breast    Social History History  Substance Use Topics  . Smoking status: Never Smoker   . Smokeless tobacco: Never Used  . Alcohol Use: No    Allergies  Allergen Reactions  . Procardia (Nifedipine)   . Sulfa Antibiotics Swelling  . Tekturna (Aliskiren) Swelling    Current Outpatient Prescriptions  Medication Sig Dispense Refill  . cetirizine (ZYRTEC) 10 MG tablet Take 10 mg by mouth as needed.      . fluticasone (FLONASE) 50 MCG/ACT nasal spray Place 2 sprays into the nose as needed.      . predniSONE (DELTASONE) 20 MG tablet Take 20 mg by mouth as needed.      . sertraline (ZOLOFT) 50 MG tablet Take 50 mg by mouth as needed.      . colchicine 0.6 MG tablet Take 1 tablet (0.6 mg total) by mouth daily.  60 tablet  1  . febuxostat  (ULORIC) 40 MG tablet One daily  30 tablet  6  . furosemide (LASIX) 20 MG tablet TAKE 1 TABLET BY MOUTH DAILY  90 tablet  1  . ibuprofen (ADVIL,MOTRIN) 200 MG tablet Take 400 mg by mouth every 6 (six) hours as needed. For pain      . labetalol (NORMODYNE) 100 MG tablet Take 1 tablet (100 mg total) by mouth 2 (two) times daily.  60 tablet  5  . levothyroxine (LEVOTHROID) 25 MCG tablet Take 1 tablet (25 mcg total) by mouth daily.  90 tablet  2  . LORazepam (ATIVAN) 0.5 MG tablet Take 0.5 mg by mouth every 8 (eight) hours.      . Multiple Vitamin (MULITIVITAMIN WITH MINERALS) TABS Take 1 tablet by mouth daily.      Marland Kitchen olmesartan (BENICAR) 40 MG tablet Take 1 tablet (40 mg total) by mouth 2 (two) times daily.  60 tablet  11  . [DISCONTINUED] cetirizine (ZYRTEC) 10 MG tablet Take 10 mg by mouth daily.      . [DISCONTINUED] fluticasone (FLONASE) 50 MCG/ACT nasal spray Place 2 sprays into the nose daily.      . [DISCONTINUED] sertraline (  ZOLOFT) 50 MG tablet Take 1 tablet (50 mg total) by mouth daily.  30 tablet  3    Review of Systems Review of Systems  Constitutional: Negative for fever, chills and unexpected weight change.  HENT: Negative for hearing loss, congestion, sore throat, trouble swallowing and voice change.   Eyes: Negative for visual disturbance.  Respiratory: Negative for cough and wheezing.   Cardiovascular: Negative for chest pain, palpitations and leg swelling.  Gastrointestinal: Negative for nausea, vomiting, abdominal pain, diarrhea, constipation, blood in stool, abdominal distention and anal bleeding.  Genitourinary: Negative for hematuria, vaginal bleeding and difficulty urinating.  Musculoskeletal: Negative for arthralgias.  Skin: Negative for rash and wound.  Neurological: Negative for seizures, syncope and headaches.  Hematological: Negative for adenopathy. Does not bruise/bleed easily.  Psychiatric/Behavioral: Negative for confusion.    Blood pressure 142/84, pulse 74,  temperature 97.8 F (36.6 C), temperature source Temporal, resp. rate 16, height 5\' 7"  (1.702 m), weight 251 lb 4 oz (113.966 kg).  Physical Exam Physical Exam  Vitals reviewed. Constitutional: She is oriented to person, place, and time. She appears well-developed and well-nourished. No distress.  HENT:  Head: Normocephalic and atraumatic.  Mouth/Throat: Oropharynx is clear and moist.  Eyes: Conjunctivae normal and EOM are normal. Pupils are equal, round, and reactive to light. No scleral icterus.  Neck: Normal range of motion. Neck supple. No tracheal deviation present. No thyromegaly present.  Cardiovascular: Normal rate, regular rhythm, normal heart sounds and intact distal pulses.  Exam reveals no gallop and no friction rub.   No murmur heard. Pulmonary/Chest: Effort normal and breath sounds normal. No respiratory distress. She has no wheezes. She has no rales.       The breasts are very large and pendulous. There is no dominant mass and the papilloma is not palpable. There is no significant ecchymosis from the biopsy. There is no dominant mass. There are nontender. The nipple and areolar areas look normal bilaterally.  Abdominal: Soft. Bowel sounds are normal. She exhibits no distension and no mass. There is no tenderness. There is no rebound and no guarding.  Musculoskeletal: Normal range of motion. She exhibits no edema and no tenderness.  Lymphadenopathy:    She has no cervical adenopathy.    She has no axillary adenopathy.       Right: No supraclavicular adenopathy present.       Left: No supraclavicular adenopathy present.  Neurological: She is alert and oriented to person, place, and time.  Skin: Skin is warm and dry. No rash noted. She is not diaphoretic. No erythema.  Psychiatric: She has a normal mood and affect. Her behavior is normal. Judgment and thought content normal.    Data Reviewed I reviewed the mammogram and ultrasound films and reports and the pathology  report  Assessment    Intraductal papilloma right breast areolar margin Hypertension History of congestive heart failure Gout    Plan    We will schedule her for a wire localized excision of the small intraductal papilloma found on recent right breast biopsy       Della Homan J 12/03/2011, 3:58 PM

## 2011-12-03 NOTE — Patient Instructions (Signed)
We will schedule outpatient surgery to remove the small mass in your right breast that was a intraductal papilloma on the biopsy that you had done at the [facility] Center

## 2011-12-03 NOTE — Addendum Note (Signed)
Addended byMargarette Asal on: 12/03/2011 04:47 PM   Modules accepted: Orders

## 2011-12-08 ENCOUNTER — Other Ambulatory Visit: Payer: Self-pay | Admitting: Family Medicine

## 2011-12-08 NOTE — Telephone Encounter (Signed)
At tl desk 

## 2011-12-09 NOTE — Telephone Encounter (Signed)
rx faxed to pharmacy

## 2011-12-13 ENCOUNTER — Encounter: Payer: Self-pay | Admitting: Family Medicine

## 2011-12-14 ENCOUNTER — Encounter: Payer: Self-pay | Admitting: Family Medicine

## 2011-12-21 HISTORY — PX: BREAST CYST EXCISION: SHX579

## 2011-12-30 ENCOUNTER — Encounter (HOSPITAL_BASED_OUTPATIENT_CLINIC_OR_DEPARTMENT_OTHER): Payer: Self-pay | Admitting: *Deleted

## 2011-12-30 NOTE — Progress Notes (Signed)
Pt to come in for bmet-she does not use her cpap-she is very anxious-told her she would need to bring all her meds-and an overnight bag in case she needs to stay overnight since she does not use her cpap, Take ativan preop-

## 2011-12-31 ENCOUNTER — Encounter (HOSPITAL_BASED_OUTPATIENT_CLINIC_OR_DEPARTMENT_OTHER)
Admission: RE | Admit: 2011-12-31 | Discharge: 2011-12-31 | Disposition: A | Discharge status: Home / Self Care | Payer: BC Managed Care – PPO | Source: Ambulatory Visit | Attending: Surgery | Admitting: Surgery

## 2011-12-31 LAB — BASIC METABOLIC PANEL
Chloride: 105 mEq/L (ref 96–112)
GFR calc Af Amer: 44 mL/min — ABNORMAL LOW (ref >90)
GFR calc non Af Amer: 38 mL/min — ABNORMAL LOW (ref >90)
Potassium: 4.1 mEq/L (ref 3.5–5.1)
Sodium: 141 mEq/L (ref 135–145)

## 2012-01-01 ENCOUNTER — Encounter (HOSPITAL_BASED_OUTPATIENT_CLINIC_OR_DEPARTMENT_OTHER)
Admission: RE | Disposition: A | Discharge status: Home / Self Care | Payer: Self-pay | Source: Ambulatory Visit | Attending: Surgery

## 2012-01-01 ENCOUNTER — Ambulatory Visit (HOSPITAL_BASED_OUTPATIENT_CLINIC_OR_DEPARTMENT_OTHER)
Admission: RE | Admit: 2012-01-01 | Discharge: 2012-01-01 | Disposition: A | Discharge status: Home / Self Care | Payer: BC Managed Care – PPO | Source: Ambulatory Visit | Attending: Surgery | Admitting: Surgery

## 2012-01-01 ENCOUNTER — Encounter (HOSPITAL_BASED_OUTPATIENT_CLINIC_OR_DEPARTMENT_OTHER): Payer: Self-pay | Admitting: Anesthesiology

## 2012-01-01 ENCOUNTER — Encounter (HOSPITAL_BASED_OUTPATIENT_CLINIC_OR_DEPARTMENT_OTHER): Payer: Self-pay

## 2012-01-01 ENCOUNTER — Ambulatory Visit (HOSPITAL_BASED_OUTPATIENT_CLINIC_OR_DEPARTMENT_OTHER): Payer: BC Managed Care – PPO | Admitting: Anesthesiology

## 2012-01-01 DIAGNOSIS — N189 Chronic kidney disease, unspecified: Secondary | ICD-10-CM | POA: Insufficient documentation

## 2012-01-01 DIAGNOSIS — R7309 Other abnormal glucose: Secondary | ICD-10-CM | POA: Insufficient documentation

## 2012-01-01 DIAGNOSIS — Z841 Family history of disorders of kidney and ureter: Secondary | ICD-10-CM | POA: Insufficient documentation

## 2012-01-01 DIAGNOSIS — I129 Hypertensive chronic kidney disease with stage 1 through stage 4 chronic kidney disease, or unspecified chronic kidney disease: Secondary | ICD-10-CM | POA: Insufficient documentation

## 2012-01-01 DIAGNOSIS — Z882 Allergy status to sulfonamides status: Secondary | ICD-10-CM | POA: Insufficient documentation

## 2012-01-01 DIAGNOSIS — Z833 Family history of diabetes mellitus: Secondary | ICD-10-CM | POA: Insufficient documentation

## 2012-01-01 DIAGNOSIS — Z90710 Acquired absence of both cervix and uterus: Secondary | ICD-10-CM | POA: Insufficient documentation

## 2012-01-01 DIAGNOSIS — G4733 Obstructive sleep apnea (adult) (pediatric): Secondary | ICD-10-CM | POA: Insufficient documentation

## 2012-01-01 DIAGNOSIS — D249 Benign neoplasm of unspecified breast: Secondary | ICD-10-CM | POA: Insufficient documentation

## 2012-01-01 DIAGNOSIS — F411 Generalized anxiety disorder: Secondary | ICD-10-CM | POA: Insufficient documentation

## 2012-01-01 DIAGNOSIS — E039 Hypothyroidism, unspecified: Secondary | ICD-10-CM | POA: Insufficient documentation

## 2012-01-01 DIAGNOSIS — Z8249 Family history of ischemic heart disease and other diseases of the circulatory system: Secondary | ICD-10-CM | POA: Insufficient documentation

## 2012-01-01 DIAGNOSIS — Z803 Family history of malignant neoplasm of breast: Secondary | ICD-10-CM | POA: Insufficient documentation

## 2012-01-01 DIAGNOSIS — E785 Hyperlipidemia, unspecified: Secondary | ICD-10-CM | POA: Insufficient documentation

## 2012-01-01 DIAGNOSIS — I509 Heart failure, unspecified: Secondary | ICD-10-CM | POA: Insufficient documentation

## 2012-01-01 DIAGNOSIS — M109 Gout, unspecified: Secondary | ICD-10-CM | POA: Insufficient documentation

## 2012-01-01 DIAGNOSIS — N6089 Other benign mammary dysplasias of unspecified breast: Secondary | ICD-10-CM

## 2012-01-01 HISTORY — DX: Major depressive disorder, single episode, unspecified: F32.9

## 2012-01-01 HISTORY — DX: Depression, unspecified: F32.A

## 2012-01-01 HISTORY — DX: Anxiety disorder, unspecified: F41.9

## 2012-01-01 HISTORY — PX: BREAST BIOPSY: SHX20

## 2012-01-01 HISTORY — DX: Adverse effect of unspecified anesthetic, initial encounter: T41.45XA

## 2012-01-01 HISTORY — DX: Other complications of anesthesia, initial encounter: T88.59XA

## 2012-01-01 SURGERY — BREAST BIOPSY WITH NEEDLE LOCALIZATION
Anesthesia: General | Site: Breast | Laterality: Right | Wound class: Clean

## 2012-01-01 MED ORDER — LABETALOL HCL 5 MG/ML IV SOLN
INTRAVENOUS | Status: DC | PRN
  Administered 2012-01-01: 5 mg via INTRAVENOUS

## 2012-01-01 MED ORDER — FENTANYL CITRATE 0.05 MG/ML IJ SOLN: 25.0000 ug | INTRAMUSCULAR | Status: DC | PRN

## 2012-01-01 MED ORDER — LIDOCAINE HCL (CARDIAC) 20 MG/ML IV SOLN
INTRAVENOUS | Status: DC | PRN
  Administered 2012-01-01: 80 mg via INTRAVENOUS

## 2012-01-01 MED ORDER — OXYCODONE HCL 5 MG PO TABS: 5.0000 mg | ORAL_TABLET | Freq: Once | ORAL | Status: DC | PRN

## 2012-01-01 MED ORDER — ONDANSETRON HCL 4 MG/2ML IJ SOLN
INTRAMUSCULAR | Status: DC | PRN
  Administered 2012-01-01: 4 mg via INTRAVENOUS

## 2012-01-01 MED ORDER — FENTANYL CITRATE 0.05 MG/ML IJ SOLN
50.0000 ug | Freq: Once | INTRAMUSCULAR | Status: AC
  Administered 2012-01-01: 100 ug via INTRAVENOUS

## 2012-01-01 MED ORDER — MIDAZOLAM HCL 5 MG/5ML IJ SOLN
INTRAMUSCULAR | Status: DC | PRN
  Administered 2012-01-01: 2 mg via INTRAVENOUS

## 2012-01-01 MED ORDER — CHLORHEXIDINE GLUCONATE 4 % EX LIQD: 1.0000 "application " | Freq: Once | CUTANEOUS | Status: DC

## 2012-01-01 MED ORDER — MIDAZOLAM HCL 2 MG/2ML IJ SOLN
1.0000 mg | INTRAMUSCULAR | Status: DC | PRN
  Administered 2012-01-01 (×2): 1 mg via INTRAVENOUS

## 2012-01-01 MED ORDER — BUPIVACAINE HCL (PF) 0.25 % IJ SOLN
INTRAMUSCULAR | Status: DC | PRN
  Administered 2012-01-01: 16 mL

## 2012-01-01 MED ORDER — HYDROCODONE-ACETAMINOPHEN 5-325 MG PO TABS: 1.0000 | ORAL_TABLET | ORAL | Status: DC | PRN

## 2012-01-01 MED ORDER — PROPOFOL 10 MG/ML IV BOLUS
INTRAVENOUS | Status: DC | PRN
  Administered 2012-01-01: 200 mg via INTRAVENOUS

## 2012-01-01 MED ORDER — LACTATED RINGERS IV SOLN
INTRAVENOUS | Status: DC
  Administered 2012-01-01: 13:00:00 via INTRAVENOUS

## 2012-01-01 MED ORDER — OXYCODONE HCL 5 MG/5ML PO SOLN: 5.0000 mg | Freq: Once | ORAL | Status: DC | PRN

## 2012-01-01 MED ORDER — PROMETHAZINE HCL 25 MG/ML IJ SOLN: 6.2500 mg | INTRAMUSCULAR | Status: DC | PRN

## 2012-01-01 MED ORDER — LACTATED RINGERS IV SOLN
INTRAVENOUS | Status: DC | PRN
  Administered 2012-01-01: 13:00:00 via INTRAVENOUS

## 2012-01-01 MED ORDER — FENTANYL CITRATE 0.05 MG/ML IJ SOLN
INTRAMUSCULAR | Status: DC | PRN
  Administered 2012-01-01 (×2): 50 ug via INTRAVENOUS

## 2012-01-01 MED ORDER — KETOROLAC TROMETHAMINE 30 MG/ML IJ SOLN
INTRAMUSCULAR | Status: DC | PRN
  Administered 2012-01-01: 30 mg via INTRAVENOUS

## 2012-01-01 MED ORDER — CEFAZOLIN SODIUM-DEXTROSE 2-3 GM-% IV SOLR: 2.0000 g | INTRAVENOUS | Status: DC

## 2012-01-01 MED ORDER — LABETALOL HCL 5 MG/ML IV SOLN
10.0000 mg | Freq: Once | INTRAVENOUS | Status: AC
  Administered 2012-01-01: 10 mg via INTRAVENOUS

## 2012-01-01 MED ORDER — CEFAZOLIN SODIUM-DEXTROSE 2-3 GM-% IV SOLR
INTRAVENOUS | Status: DC | PRN
  Administered 2012-01-01: 2 g via INTRAVENOUS

## 2012-01-01 SURGICAL SUPPLY — 50 items
ADH SKN CLS APL DERMABOND .7 (GAUZE/BANDAGES/DRESSINGS) ×1
APPLICATOR COTTON TIP 6IN STRL (MISCELLANEOUS) IMPLANT
BINDER BREAST LRG (GAUZE/BANDAGES/DRESSINGS) IMPLANT
BINDER BREAST MEDIUM (GAUZE/BANDAGES/DRESSINGS) IMPLANT
BINDER BREAST XLRG (GAUZE/BANDAGES/DRESSINGS) IMPLANT
BINDER BREAST XXLRG (GAUZE/BANDAGES/DRESSINGS) IMPLANT
BLADE HEX COATED 2.75 (ELECTRODE) ×2 IMPLANT
BLADE SURG 15 STRL LF DISP TIS (BLADE) ×1 IMPLANT
BLADE SURG 15 STRL SS (BLADE) ×2
CANISTER SUCTION 1200CC (MISCELLANEOUS) ×2 IMPLANT
CHLORAPREP W/TINT 26ML (MISCELLANEOUS) ×2 IMPLANT
CLIP TI MEDIUM 6 (CLIP) IMPLANT
CLIP TI WIDE RED SMALL 6 (CLIP) IMPLANT
CLOTH BEACON ORANGE TIMEOUT ST (SAFETY) ×2 IMPLANT
COVER MAYO STAND STRL (DRAPES) ×2 IMPLANT
COVER TABLE BACK 60X90 (DRAPES) ×2 IMPLANT
DECANTER SPIKE VIAL GLASS SM (MISCELLANEOUS) IMPLANT
DERMABOND ADVANCED (GAUZE/BANDAGES/DRESSINGS) ×1
DERMABOND ADVANCED .7 DNX12 (GAUZE/BANDAGES/DRESSINGS) ×1 IMPLANT
DEVICE DUBIN W/COMP PLATE 8390 (MISCELLANEOUS) ×1 IMPLANT
DRAPE LAPAROTOMY TRNSV 102X78 (DRAPE) ×2 IMPLANT
DRAPE SURG 17X23 STRL (DRAPES) ×1 IMPLANT
DRAPE UTILITY XL STRL (DRAPES) ×2 IMPLANT
ELECT REM PT RETURN 9FT ADLT (ELECTROSURGICAL) ×2
ELECTRODE REM PT RTRN 9FT ADLT (ELECTROSURGICAL) ×1 IMPLANT
GLOVE BIO SURGEON STRL SZ7.5 (GLOVE) ×2 IMPLANT
GLOVE BIOGEL PI IND STRL 8 (GLOVE) IMPLANT
GLOVE BIOGEL PI INDICATOR 8 (GLOVE) ×1
GLOVE EUDERMIC 7 POWDERFREE (GLOVE) ×2 IMPLANT
GOWN PREVENTION PLUS XLARGE (GOWN DISPOSABLE) ×4 IMPLANT
KIT MARKER MARGIN INK (KITS) IMPLANT
NDL HYPO 25X1 1.5 SAFETY (NEEDLE) ×1 IMPLANT
NEEDLE HYPO 25X1 1.5 SAFETY (NEEDLE) ×2 IMPLANT
NS IRRIG 1000ML POUR BTL (IV SOLUTION) ×1 IMPLANT
PACK BASIN DAY SURGERY FS (CUSTOM PROCEDURE TRAY) ×2 IMPLANT
PENCIL BUTTON HOLSTER BLD 10FT (ELECTRODE) ×2 IMPLANT
SHEET MEDIUM DRAPE 40X70 STRL (DRAPES) ×1 IMPLANT
SLEEVE SCD COMPRESS KNEE MED (MISCELLANEOUS) ×2 IMPLANT
SPONGE GAUZE 4X4 12PLY (GAUZE/BANDAGES/DRESSINGS) IMPLANT
SPONGE INTESTINAL PEANUT (DISPOSABLE) IMPLANT
SPONGE LAP 4X18 X RAY DECT (DISPOSABLE) ×2 IMPLANT
STAPLER VISISTAT 35W (STAPLE) IMPLANT
SUT MNCRL AB 4-0 PS2 18 (SUTURE) ×2 IMPLANT
SUT SILK 0 TIES 10X30 (SUTURE) IMPLANT
SUT VICRYL 3-0 CR8 SH (SUTURE) ×2 IMPLANT
SYR CONTROL 10ML LL (SYRINGE) ×2 IMPLANT
TOWEL OR NON WOVEN STRL DISP B (DISPOSABLE) ×2 IMPLANT
TUBE CONNECTING 20X1/4 (TUBING) ×2 IMPLANT
WATER STERILE IRR 1000ML POUR (IV SOLUTION) ×1 IMPLANT
YANKAUER SUCT BULB TIP NO VENT (SUCTIONS) ×2 IMPLANT

## 2012-01-01 NOTE — Anesthesia Postprocedure Evaluation (Signed)
  Anesthesia Post-op Note  Patient: Chelsea Roberts  Procedure(s) Performed: Procedure(s) (LRB) with comments: BREAST BIOPSY WITH NEEDLE LOCALIZATION (Right) - Needle localization excision Right breast mass  Patient Location: PACU  Anesthesia Type:General  Level of Consciousness: awake and alert   Airway and Oxygen Therapy: Patient Spontanous Breathing  Post-op Pain: mild  Post-op Assessment: Post-op Vital signs reviewed, Patient's Cardiovascular Status Stable, Respiratory Function Stable, Patent Airway, No signs of Nausea or vomiting, Adequate PO intake and Pain level controlled  Post-op Vital Signs: stable  Complications: No apparent anesthesia complications

## 2012-01-01 NOTE — Anesthesia Preprocedure Evaluation (Signed)
Anesthesia Evaluation  Patient identified by MRN, date of birth, ID band Patient awake    Reviewed: Allergy & Precautions, H&P , NPO status , Patient's Chart, lab work & pertinent test results  Airway Mallampati: II TM Distance: >3 FB Neck ROM: Full    Dental   Pulmonary sleep apnea ,  breath sounds clear to auscultation        Cardiovascular hypertension, Rhythm:Regular Rate:Normal     Neuro/Psych Anxiety Depression    GI/Hepatic   Endo/Other  diabetesHypothyroidism   Renal/GU      Musculoskeletal   Abdominal (+) + obese,   Peds  Hematology   Anesthesia Other Findings   Reproductive/Obstetrics                           Anesthesia Physical Anesthesia Plan  ASA: III  Anesthesia Plan: General   Post-op Pain Management:    Induction: Intravenous  Airway Management Planned: LMA  Additional Equipment:   Intra-op Plan:   Post-operative Plan: Extubation in OR  Informed Consent: I have reviewed the patients History and Physical, chart, labs and discussed the procedure including the risks, benefits and alternatives for the proposed anesthesia with the patient or authorized representative who has indicated his/her understanding and acceptance.     Plan Discussed with: CRNA and Surgeon  Anesthesia Plan Comments: (IV sedatives preop to attempt to reduce anxiety and BP May need additional BP control meds)        Anesthesia Quick Evaluation

## 2012-01-01 NOTE — Interval H&P Note (Signed)
History and Physical Interval Note:  01/01/2012 12:56 PM  Chelsea Roberts  has presented today for surgery, with the diagnosis of intraductal papilloma  The various methods of treatment have been discussed with the patient and family. After consideration of risks, benefits and other options for treatment, the patient has consented to  Procedure(s) (LRB) with comments: BREAST BIOPSY WITH NEEDLE LOCALIZATION (Right) - Needle localization excision Right breast mass as a surgical intervention .  The patient's history has been reviewed, patient examined, no change in status, stable for surgery.  I have reviewed the patient's chart and labs.  Questions were answered to the patient's satisfaction.     Canna Nickelson J

## 2012-01-01 NOTE — Anesthesia Procedure Notes (Signed)
Procedure Name: LMA Insertion Date/Time: 01/01/2012 1:37 PM Performed by: Justice Rocher Pre-anesthesia Checklist: Patient identified, Emergency Drugs available, Suction available and Patient being monitored Patient Re-evaluated:Patient Re-evaluated prior to inductionOxygen Delivery Method: Circle System Utilized Preoxygenation: Pre-oxygenation with 100% oxygen Intubation Type: IV induction Ventilation: Mask ventilation without difficulty LMA: LMA inserted LMA Size: 4.0 Number of attempts: 1 Airway Equipment and Method: bite block Placement Confirmation: positive ETCO2 Tube secured with: Tape Dental Injury: Teeth and Oropharynx as per pre-operative assessment

## 2012-01-01 NOTE — Op Note (Signed)
Chelsea Roberts  1955/07/01  [id number]  01/01/2012   Preoperative diagnosis: Right breast mass, papilloma on NCB  Postoperative diagnosis: Same  Procedure: NL Removal of right breast mass  Surgeon: Haywood Lasso, MD, FACS  Anesthesia: General  Clinical History and Indications: this patient presents for a guidewire localized excision of a right  breast mass thought to be a papilloma.  Description of procedure: The patient was seen in the holding area and the plans for the procedure reviewed. The righ breast was marked as the operative side. The wire localizing films were reviewed.  The patient was taken to the operating room and after satisfactory general anesthesia had been obtained the right breast was prepped and draped and the timeout was performed.  The incision was made over the presumed area of the mass. The guide wire entered medial and tr acted towards the nipple. The mas appeared superficial. The incision was made directly over the tract of the wire and a cylinder of tissue excised. I got beyond the tip of the wire and there was a small mass palpable in the specimen. There was no residual palpable mass in the breast/Skin flaps were raised and using cautery the area was completely excised. Bleeders were controlled with either cautery or sutures as needed.  After achieving hemostasis, the incision was closed with 3-0 Vicryl, 4-0 Monocryl subcuticular, and Dermabond.  The patient tolerated the procedure well. There were no operative complications. All counts were correct.   Haywood Lasso, MD, FACS 01/01/2012 2:06 PM

## 2012-01-01 NOTE — H&P (View-Only) (Signed)
Patient ID: Chelsea Roberts, female   DOB: 09-15-1955, 56 y.o.   MRN: [id number]  Chief Complaint  Patient presents with  . Breast Mass    Breast    HPI Chelsea Roberts is a 56 y.o. female.  She recently had a mammogram a small abnormality was found in the right breast 2:30 position. A needle core biopsy was done and this appears to be an intraductal papilloma. Surgical consultation was requested. The patient is asymptomatic. This is an incidental finding a mammogram. She is having no breast symptoms. She has negative family history for breast cancer. HPI  Past Medical History  Diagnosis Date  . Gout   . Hypertension   . Diabetes mellitus borderline  . Chronic kidney disease   . Hyperlipidemia   . OSA (obstructive sleep apnea)   . Systolic CHF, chronic   . Gout   . Breast mass     Past Surgical History  Procedure Date  . Abdominal hysterectomy 1999    partial    Family History  Problem Relation Age of Onset  . Heart failure Sister   . Kidney disease Sister   . Hypertension Mother   . Hypertension Sister   . Diabetes type II Sister   . Cancer Father   . Cancer Maternal Aunt     ovarian  . Cancer Maternal Grandmother     breast    Social History History  Substance Use Topics  . Smoking status: Never Smoker   . Smokeless tobacco: Never Used  . Alcohol Use: No    Allergies  Allergen Reactions  . Procardia (Nifedipine)   . Sulfa Antibiotics Swelling  . Tekturna (Aliskiren) Swelling    Current Outpatient Prescriptions  Medication Sig Dispense Refill  . cetirizine (ZYRTEC) 10 MG tablet Take 10 mg by mouth as needed.      . fluticasone (FLONASE) 50 MCG/ACT nasal spray Place 2 sprays into the nose as needed.      . predniSONE (DELTASONE) 20 MG tablet Take 20 mg by mouth as needed.      . sertraline (ZOLOFT) 50 MG tablet Take 50 mg by mouth as needed.      . colchicine 0.6 MG tablet Take 1 tablet (0.6 mg total) by mouth daily.  60 tablet  1  . febuxostat  (ULORIC) 40 MG tablet One daily  30 tablet  6  . furosemide (LASIX) 20 MG tablet TAKE 1 TABLET BY MOUTH DAILY  90 tablet  1  . ibuprofen (ADVIL,MOTRIN) 200 MG tablet Take 400 mg by mouth every 6 (six) hours as needed. For pain      . labetalol (NORMODYNE) 100 MG tablet Take 1 tablet (100 mg total) by mouth 2 (two) times daily.  60 tablet  5  . levothyroxine (LEVOTHROID) 25 MCG tablet Take 1 tablet (25 mcg total) by mouth daily.  90 tablet  2  . LORazepam (ATIVAN) 0.5 MG tablet Take 0.5 mg by mouth every 8 (eight) hours.      . Multiple Vitamin (MULITIVITAMIN WITH MINERALS) TABS Take 1 tablet by mouth daily.      Marland Kitchen olmesartan (BENICAR) 40 MG tablet Take 1 tablet (40 mg total) by mouth 2 (two) times daily.  60 tablet  11  . [DISCONTINUED] cetirizine (ZYRTEC) 10 MG tablet Take 10 mg by mouth daily.      . [DISCONTINUED] fluticasone (FLONASE) 50 MCG/ACT nasal spray Place 2 sprays into the nose daily.      . [DISCONTINUED] sertraline (  ZOLOFT) 50 MG tablet Take 1 tablet (50 mg total) by mouth daily.  30 tablet  3    Review of Systems Review of Systems  Constitutional: Negative for fever, chills and unexpected weight change.  HENT: Negative for hearing loss, congestion, sore throat, trouble swallowing and voice change.   Eyes: Negative for visual disturbance.  Respiratory: Negative for cough and wheezing.   Cardiovascular: Negative for chest pain, palpitations and leg swelling.  Gastrointestinal: Negative for nausea, vomiting, abdominal pain, diarrhea, constipation, blood in stool, abdominal distention and anal bleeding.  Genitourinary: Negative for hematuria, vaginal bleeding and difficulty urinating.  Musculoskeletal: Negative for arthralgias.  Skin: Negative for rash and wound.  Neurological: Negative for seizures, syncope and headaches.  Hematological: Negative for adenopathy. Does not bruise/bleed easily.  Psychiatric/Behavioral: Negative for confusion.    Blood pressure 142/84, pulse 74,  temperature 97.8 F (36.6 C), temperature source Temporal, resp. rate 16, height 5\' 7"  (1.702 m), weight 251 lb 4 oz (113.966 kg).  Physical Exam Physical Exam  Vitals reviewed. Constitutional: She is oriented to person, place, and time. She appears well-developed and well-nourished. No distress.  HENT:  Head: Normocephalic and atraumatic.  Mouth/Throat: Oropharynx is clear and moist.  Eyes: Conjunctivae normal and EOM are normal. Pupils are equal, round, and reactive to light. No scleral icterus.  Neck: Normal range of motion. Neck supple. No tracheal deviation present. No thyromegaly present.  Cardiovascular: Normal rate, regular rhythm, normal heart sounds and intact distal pulses.  Exam reveals no gallop and no friction rub.   No murmur heard. Pulmonary/Chest: Effort normal and breath sounds normal. No respiratory distress. She has no wheezes. She has no rales.       The breasts are very large and pendulous. There is no dominant mass and the papilloma is not palpable. There is no significant ecchymosis from the biopsy. There is no dominant mass. There are nontender. The nipple and areolar areas look normal bilaterally.  Abdominal: Soft. Bowel sounds are normal. She exhibits no distension and no mass. There is no tenderness. There is no rebound and no guarding.  Musculoskeletal: Normal range of motion. She exhibits no edema and no tenderness.  Lymphadenopathy:    She has no cervical adenopathy.    She has no axillary adenopathy.       Right: No supraclavicular adenopathy present.       Left: No supraclavicular adenopathy present.  Neurological: She is alert and oriented to person, place, and time.  Skin: Skin is warm and dry. No rash noted. She is not diaphoretic. No erythema.  Psychiatric: She has a normal mood and affect. Her behavior is normal. Judgment and thought content normal.    Data Reviewed I reviewed the mammogram and ultrasound films and reports and the pathology  report  Assessment    Intraductal papilloma right breast areolar margin Hypertension History of congestive heart failure Gout    Plan    We will schedule her for a wire localized excision of the small intraductal papilloma found on recent right breast biopsy       Kamri Gotsch J 12/03/2011, 3:58 PM

## 2012-01-01 NOTE — Transfer of Care (Signed)
  Immediate Anesthesia Transfer of Care Note  Patient: Chelsea Roberts  Procedure(s) Performed: Procedure(s) (LRB): BREAST BIOPSY WITH NEEDLE LOCALIZATION (Right)  Patient Location: PACU  Anesthesia Type: General  Level of Consciousness: awake, sedated, patient cooperative and responds to stimulation  Airway & Oxygen Therapy: Patient Spontanous Breathing and Patient connected to face mask oxygen  Post-op Assessment: Report given to PACU RN, Post -op Vital signs reviewed and stable and Patient moving all extremities  Post vital signs: Reviewed and stable  Complications: No apparent anesthesia complications

## 2012-01-01 NOTE — Anesthesia Postprocedure Evaluation (Signed)
Immediate Anesthesia Transfer of Care Note  Patient: Chelsea Roberts  Procedure(s) Performed: Procedure(s) (LRB): BREAST BIOPSY WITH NEEDLE LOCALIZATION (Right)  Patient Location: PACU  Anesthesia Type: General  Level of Consciousness: awake, sedated, patient cooperative and responds to stimulation  Airway & Oxygen Therapy: Patient Spontanous Breathing and Patient connected to face mask oxygen  Post-op Assessment: Report given to PACU RN, Post -op Vital signs reviewed and stable and Patient moving all extremities  Post vital signs: Reviewed and stable  Complications: No apparent anesthesia complications

## 2012-01-02 ENCOUNTER — Encounter (HOSPITAL_BASED_OUTPATIENT_CLINIC_OR_DEPARTMENT_OTHER): Payer: Self-pay | Admitting: Surgery

## 2012-01-05 ENCOUNTER — Other Ambulatory Visit: Payer: Self-pay | Admitting: Physician Assistant

## 2012-01-05 NOTE — Telephone Encounter (Signed)
Needs office visit.

## 2012-01-06 ENCOUNTER — Telehealth (INDEPENDENT_AMBULATORY_CARE_PROVIDER_SITE_OTHER): Payer: Self-pay | Admitting: General Surgery

## 2012-01-06 NOTE — Telephone Encounter (Signed)
Patient made aware of path results. Will follow up at appt and call with any questions prior.  

## 2012-01-06 NOTE — Telephone Encounter (Signed)
Message copied by Margarette Asal on Tue Jan 06, 2012  8:41 AM ------      Message from: Haywood Lasso      Created: Tue Jan 06, 2012  6:18 AM       Tell her path is benign and as expected

## 2012-01-08 ENCOUNTER — Encounter (INDEPENDENT_AMBULATORY_CARE_PROVIDER_SITE_OTHER): Payer: Self-pay

## 2012-01-20 ENCOUNTER — Encounter (INDEPENDENT_AMBULATORY_CARE_PROVIDER_SITE_OTHER): Payer: Self-pay | Admitting: Surgery

## 2012-01-20 ENCOUNTER — Ambulatory Visit (INDEPENDENT_AMBULATORY_CARE_PROVIDER_SITE_OTHER): Payer: BC Managed Care – PPO | Admitting: Surgery

## 2012-01-20 VITALS — BP 134/86 | HR 60 | Resp 18 | Ht 67.0 in | Wt 248.0 lb

## 2012-01-20 DIAGNOSIS — Z09 Encounter for follow-up examination after completed treatment for conditions other than malignant neoplasm: Secondary | ICD-10-CM

## 2012-01-20 NOTE — Patient Instructions (Signed)
Continue annual mamograms and breast follow up with your primary physicians We will see you again on an as needed basis. Please call the office at [phone] if you have any questions or concerns. Thank you for allowing [place] to take care of you.

## 2012-01-20 NOTE — Progress Notes (Signed)
NAME: Chelsea Roberts                                            DOB: [birth year] DATE: 01/20/2012                                                  MRN: [id number]  CC: Post op   HPI: This patient comes in for post op follow-up .Sheunderwent excision of a right intraductal papilloma on 01/01/12. She feels that she is doing well.  PE:  VITAL SIGNS: BP 134/86  Pulse 60  Resp 18  Ht 5\' 7"  (1.702 m)  Wt 248 lb (112.492 kg)  BMI 38.84 kg/m2  General: The patient appears to be healthy, NAD Incision healing nicely, no infection or hematoma  DATA REVIEWED: Diagnosis Breast, lumpectomy, Right - INTRADUCTAL PAPILLOMA WITH USUAL DUCTAL HYPERPLASIA, 1.1 CM IN GREATEST DIMENSION. - LESION IS COMPLETELY EXCISED. - NO ATYPIA OR MALIGNANCY IDENTIFIED. Microscopic Comment The margins are inked and histologically examined. Dr. Saralyn Pilar has seen this case in consultation with agreement. (RAH:gt, 01/05/12) Willeen Niece MD Pathologist, Electronic Signature  IMPRESSION: The patient is doing well S/P excision of papilloma.    PLAN: RTC PRN Gave her a copy of path and reviewed it with her

## 2012-01-25 ENCOUNTER — Other Ambulatory Visit: Payer: Self-pay | Admitting: Family Medicine

## 2012-01-25 ENCOUNTER — Ambulatory Visit (INDEPENDENT_AMBULATORY_CARE_PROVIDER_SITE_OTHER): Payer: BC Managed Care – PPO | Admitting: Family Medicine

## 2012-01-25 VITALS — BP 200/140 | HR 93 | Temp 98.3°F | Resp 18 | Ht 67.0 in | Wt 251.0 lb

## 2012-01-25 DIAGNOSIS — R509 Fever, unspecified: Secondary | ICD-10-CM

## 2012-01-25 DIAGNOSIS — F419 Anxiety disorder, unspecified: Secondary | ICD-10-CM

## 2012-01-25 DIAGNOSIS — E039 Hypothyroidism, unspecified: Secondary | ICD-10-CM

## 2012-01-25 DIAGNOSIS — I10 Essential (primary) hypertension: Secondary | ICD-10-CM

## 2012-01-25 DIAGNOSIS — J209 Acute bronchitis, unspecified: Secondary | ICD-10-CM

## 2012-01-25 DIAGNOSIS — I5020 Unspecified systolic (congestive) heart failure: Secondary | ICD-10-CM

## 2012-01-25 DIAGNOSIS — R059 Cough, unspecified: Secondary | ICD-10-CM

## 2012-01-25 DIAGNOSIS — F411 Generalized anxiety disorder: Secondary | ICD-10-CM

## 2012-01-25 DIAGNOSIS — E119 Type 2 diabetes mellitus without complications: Secondary | ICD-10-CM

## 2012-01-25 DIAGNOSIS — R05 Cough: Secondary | ICD-10-CM

## 2012-01-25 LAB — POCT URINALYSIS DIPSTICK
Ketones, UA: NEGATIVE
Nitrite, UA: NEGATIVE
Protein, UA: 100
Urobilinogen, UA: 0.2
pH, UA: 5.5

## 2012-01-25 LAB — POCT INFLUENZA A/B
Influenza A, POC: NEGATIVE
Influenza B, POC: NEGATIVE

## 2012-01-25 LAB — POCT UA - MICROSCOPIC ONLY
Mucus, UA: POSITIVE
Yeast, UA: NEGATIVE

## 2012-01-25 LAB — POCT CBC
HCT, POC: 47.3 % (ref 37.7–47.9)
MCH, POC: 26 pg — AB (ref 27–31.2)
MCV: 84.1 fL (ref 80–97)
MID (cbc): 0.6 (ref 0–0.9)
POC LYMPH PERCENT: 17.4 %L (ref 10–50)
Platelet Count, POC: 205 10*3/uL (ref 142–424)
RBC: 5.62 M/uL — AB (ref 4.04–5.48)
WBC: 10.4 10*3/uL — AB (ref 4.6–10.2)

## 2012-01-25 LAB — COMPREHENSIVE METABOLIC PANEL
ALT: 29 U/L (ref 0–35)
BUN: 24 mg/dL — ABNORMAL HIGH (ref 6–23)
CO2: 26 mEq/L (ref 19–32)
Calcium: 9.6 mg/dL (ref 8.4–10.5)
Chloride: 106 mEq/L (ref 96–112)
Creat: 1.41 mg/dL — ABNORMAL HIGH (ref 0.50–1.10)
Glucose, Bld: 106 mg/dL — ABNORMAL HIGH (ref 70–99)
Total Bilirubin: 0.4 mg/dL (ref 0.3–1.2)

## 2012-01-25 LAB — POCT GLYCOSYLATED HEMOGLOBIN (HGB A1C): Hemoglobin A1C: 6.2

## 2012-01-25 LAB — TSH: TSH: 3.423 u[IU]/mL (ref 0.350–4.500)

## 2012-01-25 MED ORDER — MUCINEX DM 30-600 MG PO TB12: 1.0000 | ORAL_TABLET | Freq: Two times a day (BID) | ORAL | Status: DC

## 2012-01-25 MED ORDER — SERTRALINE HCL 50 MG PO TABS: 50.0000 mg | ORAL_TABLET | Freq: Every day | ORAL | Status: DC

## 2012-01-25 MED ORDER — LORAZEPAM 1 MG PO TABS: 1.0000 mg | ORAL_TABLET | Freq: Two times a day (BID) | ORAL | Status: DC

## 2012-01-25 MED ORDER — LABETALOL HCL 100 MG PO TABS: 100.0000 mg | ORAL_TABLET | Freq: Three times a day (TID) | ORAL | Status: DC

## 2012-01-25 MED ORDER — FUROSEMIDE 20 MG PO TABS: 20.0000 mg | ORAL_TABLET | Freq: Every day | ORAL | Status: DC

## 2012-01-25 MED ORDER — AZITHROMYCIN 250 MG PO TABS: ORAL_TABLET | ORAL | Status: DC

## 2012-01-25 NOTE — Patient Instructions (Addendum)
1. Essential hypertension, benign  POCT CBC, POCT glucose (manual entry), POCT glycosylated hemoglobin (Hb A1C), POCT UA - Microscopic Only, Comprehensive metabolic panel, TSH, EKG XX123456, POCT urinalysis dipstick  2. Cough  POCT Influenza A/B, POCT urinalysis dipstick  3. Fever  POCT Influenza A/B, POCT urinalysis dipstick  4. Anxiety    5. Systolic CHF    6. Hypertension  labetalol (NORMODYNE) 100 MG tablet     PLEASE CALL OFFICE IN THREE DAYS WITH BLOOD PRESSURE UPDATE. RETURN TO CLINIC IN ONE WEEK FOR REPEAT BLOOD PRESSURE.

## 2012-01-25 NOTE — Progress Notes (Signed)
[place], [place]  [address]   [phone]  Subjective:    Patient ID: Chelsea Roberts, female    DOB: May 25, 1955, 57 y.o.   MRN: [id number]  HPIThis 57 y.o. female presents for evaluation of cold symptoms.  Onset yesterday.  Slight fever Tmax 99.8.  +chills/sweats.  +ST secondary to cough; no ear pain; +sinus pressure.  No rhinorrhea; no nasal congestion.  +dry hacky cough; no SOB but dizziness with ambulation.  S/p flu vaccine.  No tobacco abuse.  No sputum.  No medications yet at home.    2.  HTN:  Poorly controlled; Benicar bid and Lebatolol 100mg  bid.  Takes both Labetalol every morning.   +HA but everything hurts.  No blurred vision; no diplopia; +dizziness this morning.  No chest pain, shortness of breath, palpitations.  Compliance with medication; took Labetalol this morning two doses.  Switched from Carvedilol to Labetalol.  Will become symptomatic with elevated blood pressure; usually suffers with excessive fatigue, paresthesias face; not having these symptoms today; blood pressure always elevates with acute illness.  3.  CHF systolic mild:  Needs refill of Lasix 20mg  one daily; requesting 90 day supply.  Dr.Hochrein is cardiologist.  Prescribed HCTZ which gave gouty attack.  Amlodipine with swelling.    4.  Anxiety: follow-up for anxiety.   Needs refill on Lorazepam one po bid; duration of nine months; admitted in 03/2011 for anxiety; presented with chest pain;  Now taking Lorazepam bid.  Previously prescribed Zoloft 25mg  without improvement. Work is stressor.  Does not take Lorazepam on weekends.  Very stressful. No SI/HI.  S/p lumpectomy benign.       Review of Systems  Constitutional: Positive for fever, chills and diaphoresis. Negative for fatigue.  Eyes: Negative for photophobia, pain and visual disturbance.  Respiratory: Positive for cough. Negative for chest tightness, shortness of breath, wheezing and stridor.   Cardiovascular: Negative for chest pain,  palpitations and leg swelling.  Gastrointestinal: Negative for nausea, vomiting, abdominal pain and diarrhea.  Neurological: Positive for dizziness. Negative for tremors, seizures, syncope, facial asymmetry, speech difficulty, weakness, light-headedness, numbness and headaches.  Psychiatric/Behavioral: Negative for suicidal ideas, sleep disturbance, self-injury and dysphoric mood. The patient is nervous/anxious.         Past Medical History  Diagnosis Date  . Gout   . Hypertension   . Diabetes mellitus borderline  . Chronic kidney disease   . Hyperlipidemia   . Systolic CHF, chronic   . Gout   . Breast mass   . Complication of anesthesia     severe htn-had to take 2 ativan preop  . Anxiety   . Depression   . OSA (obstructive sleep apnea)     uses cpap--does not use     Past Surgical History  Procedure Date  . Abdominal hysterectomy 1999    partial  . Breast biopsy 01/01/2012    Procedure: BREAST BIOPSY WITH NEEDLE LOCALIZATION;  Surgeon: Haywood Lasso, MD;  Location: [facility];  Service: General;  Laterality: Right;  Needle localization excision Right breast mass    Prior to Admission medications   Medication Sig Start Date End Date Taking? Authorizing Provider  cetirizine (ZYRTEC) 10 MG tablet Take 10 mg by mouth as needed.   Yes Historical Provider, MD  colchicine 0.6 MG tablet Take 1 tablet (0.6 mg total) by mouth daily. 11/05/11 11/04/12 Yes Robyn Haber, MD  febuxostat (ULORIC) 40 MG tablet One daily 11/05/11  Yes Synetta Shadow  Lauenstein, MD  fluticasone (FLONASE) 50 MCG/ACT nasal spray Place 2 sprays into the nose as needed.   Yes Historical Provider, MD  furosemide (LASIX) 20 MG tablet Take 1 tablet (20 mg total) by mouth daily. Needs office visit. 01/05/12  Yes Ryan M Dunn, PA-C  HYDROcodone-acetaminophen (NORCO) 5-325 MG per tablet Take 1 tablet by mouth every 4 (four) hours as needed for pain. 01/01/12  Yes Haywood Lasso, MD  labetalol  (NORMODYNE) 100 MG tablet Take 1 tablet (100 mg total) by mouth 2 (two) times daily. 11/05/11  Yes Robyn Haber, MD  levothyroxine (LEVOTHROID) 25 MCG tablet Take 1 tablet (25 mcg total) by mouth daily. 04/05/11 04/04/12 Yes Hayden Rasmussen, MD  LORazepam (ATIVAN) 1 MG tablet TAKE 1 TABLET BY MOUTH TWICE A DAY AS NEEDED FOR ANXIETY 12/08/11  Yes Mancel Bale, PA-C  Multiple Vitamin (MULITIVITAMIN WITH MINERALS) TABS Take 1 tablet by mouth daily.   Yes Historical Provider, MD  olmesartan (BENICAR) 40 MG tablet Take 1 tablet (40 mg total) by mouth 2 (two) times daily. 04/05/11  Yes Hayden Rasmussen, MD  ibuprofen (ADVIL,MOTRIN) 200 MG tablet Take 400 mg by mouth every 6 (six) hours as needed. For pain    Historical Provider, MD  predniSONE (DELTASONE) 20 MG tablet Take 20 mg by mouth as needed.    Historical Provider, MD  sertraline (ZOLOFT) 50 MG tablet Take 50 mg by mouth as needed.    Historical Provider, MD    Allergies  Allergen Reactions  . Procardia (Nifedipine)   . Sulfa Antibiotics Swelling  . Tekturna (Aliskiren) Swelling    History   Social History  . Marital Status: Single    Spouse Name: N/A    Number of Children: 0  . Years of Education: N/A   Occupational History  . Insurance agent    Social History Main Topics  . Smoking status: Never Smoker   . Smokeless tobacco: Never Used  . Alcohol Use: No  . Drug Use: No  . Sexually Active: Not on file   Other Topics Concern  . Not on file   Social History Narrative   Marital:  Divorced; dating.  Children: none  Lives in [place], [place] alone.   Employment:  [employer] [place].   Tobacco: none     Family History  Problem Relation Age of Onset  . Heart failure Sister   . Diabetes Sister   . Hypertension Sister   . Kidney disease Sister   . Hypertension Sister   . Diabetes Sister   . Hypertension Mother   . Hypertension Sister   . Diabetes type II Sister   . Cancer Father     lung cancer; smoker  and asbestos exposure  . Diabetes Father   . Cancer Maternal Aunt     ovarian  . Cancer Maternal Grandmother     breast    Objective:   Physical Exam  Nursing note and vitals reviewed. Constitutional: She is oriented to person, place, and time. She appears well-developed and well-nourished. No distress.  HENT:  Head: Normocephalic and atraumatic.  Right Ear: External ear normal.  Left Ear: External ear normal.  Nose: Nose normal.  Mouth/Throat: Oropharynx is clear and moist. No oropharyngeal exudate.  Eyes: Conjunctivae normal and EOM are normal. Pupils are equal, round, and reactive to light.  Neck: Normal range of motion. Neck supple. No thyromegaly present.  Cardiovascular: Normal rate, regular rhythm, normal heart sounds and intact distal pulses.  Exam reveals no gallop and no friction rub.   No murmur heard. Pulmonary/Chest: Effort normal and breath sounds normal. She has no wheezes. She has no rales.  Musculoskeletal: She exhibits no edema.  Lymphadenopathy:    She has no cervical adenopathy.  Neurological: She is alert and oriented to person, place, and time. She has normal reflexes. No cranial nerve deficit. She exhibits normal muscle tone. Coordination normal.  Skin: She is not diaphoretic.  Psychiatric: She has a normal mood and affect. Her behavior is normal. Judgment and thought content normal.       Slightly anxious.   Results for orders placed in visit on 01/25/12  POCT CBC      Component Value Range   WBC 10.4 (*) 4.6 - 10.2 K/uL   Lymph, poc 1.8  0.6 - 3.4   POC LYMPH PERCENT 17.4  10 - 50 %L   MID (cbc) 0.6  0 - 0.9   POC MID % 5.6  0 - 12 %M   POC Granulocyte 8.0 (*) 2 - 6.9   Granulocyte percent 77.0  37 - 80 %G   RBC 5.62 (*) 4.04 - 5.48 M/uL   Hemoglobin 14.6  12.2 - 16.2 g/dL   HCT, POC 47.3  37.7 - 47.9 %   MCV 84.1  80 - 97 fL   MCH, POC 26.0 (*) 27 - 31.2 pg   MCHC 30.9 (*) 31.8 - 35.4 g/dL   RDW, POC 15.8     Platelet Count, POC 205  142 - 424  K/uL   MPV 11.8  0 - 99.8 fL  GLUCOSE, POCT (MANUAL RESULT ENTRY)      Component Value Range   POC Glucose 101 (*) 70 - 99 mg/dl  POCT GLYCOSYLATED HEMOGLOBIN (HGB A1C)      Component Value Range   Hemoglobin A1C 6.2    POCT UA - MICROSCOPIC ONLY      Component Value Range   WBC, Ur, HPF, POC tntc     RBC, urine, microscopic tntc     Bacteria, U Microscopic 4+     Mucus, UA pos     Epithelial cells, urine per micros 1-3     Crystals, Ur, HPF, POC neg     Casts, Ur, LPF, POC neg     Yeast, UA neg    POCT INFLUENZA A/B      Component Value Range   Influenza A, POC Negative     Influenza B, POC Negative    POCT URINALYSIS DIPSTICK      Component Value Range   Color, UA yellow     Clarity, UA cloudy     Glucose, UA neg     Bilirubin, UA neg     Ketones, UA neg     Spec Grav, UA 1.025     Blood, UA neg     pH, UA 5.5     Protein, UA 100     Urobilinogen, UA 0.2     Nitrite, UA neg     Leukocytes, UA small (1+)     EKG:  NSR; old infarct inferior; poor R wave progression V1-V3.  CLONIDINE 0.1MG  ONE PO X 1 IN OFFICE AT 15:40.  PATIENT TOOK ADDITIONAL LABETALOL 100MG  PO IN OFFICE AT 15:40.     Assessment & Plan:   1. Essential hypertension, benign  POCT CBC, POCT glucose (manual entry), POCT glycosylated hemoglobin (Hb A1C), POCT UA - Microscopic Only, Comprehensive metabolic panel, TSH, EKG XX123456, POCT  urinalysis dipstick  2. Cough  POCT Influenza A/B, POCT urinalysis dipstick  3. Fever  POCT Influenza A/B, POCT urinalysis dipstick  4. Anxiety    5. Systolic CHF    6. Hypertension  labetalol (NORMODYNE) 100 MG tablet      1. HTN:  Uncontrolled.  S/p Clonidine 0.1mg  po in office; also took additional Labetalol 100mg  po in office.  Increase Labetolol to 100mg  tid.  Obtain labs.   Advised to call in 72 hours with home blood pressure readings; recommend follow-up in office in 1-2 weeks. 2.  Acute bronchitis: new. Rx for Zpack and Mucinex DM provided. 3.  Anxiety:  stable/persistent; restart Zoloft 50mg  daily; refill of Lorazepam bid provided.  Discussed addictive nature of Lorazepam and preferred to be used PRN. 4.  Systolic CHF:  Stable; no volume overload in office; asymptomatic; refill of Lasix provided.  Obtain labs.  Meds ordered this encounter  Medications  . furosemide (LASIX) 20 MG tablet    Sig: Take 1 tablet (20 mg total) by mouth daily.    Dispense:  90 tablet    Refill:  5  . LORazepam (ATIVAN) 1 MG tablet    Sig: Take 1 tablet (1 mg total) by mouth 2 (two) times daily.    Dispense:  60 tablet    Refill:  5    Needs ov before more refills are given  . sertraline (ZOLOFT) 50 MG tablet    Sig: Take 1 tablet (50 mg total) by mouth daily.    Dispense:  30 tablet    Refill:  5  . azithromycin (ZITHROMAX) 250 MG tablet    Sig: 2 tablets daily x 1 day then one tablet daily x 4 days    Dispense:  6 tablet    Refill:  0  . Dextromethorphan-Guaifenesin (MUCINEX DM) 30-600 MG TB12    Sig: Take 1 tablet by mouth 2 (two) times daily.    Dispense:  28 each    Refill:  0  . labetalol (NORMODYNE) 100 MG tablet    Sig: Take 1 tablet (100 mg total) by mouth 3 (three) times daily.    Dispense:  90 tablet    Refill:  5

## 2012-01-30 LAB — IRON AND TIBC: %SAT: 12 % — ABNORMAL LOW (ref 20–55)

## 2012-01-30 LAB — FOLATE: Folate: >20 ng/mL

## 2012-02-14 ENCOUNTER — Other Ambulatory Visit: Payer: Self-pay | Admitting: Family Medicine

## 2012-02-16 ENCOUNTER — Telehealth: Payer: Self-pay

## 2012-02-16 NOTE — Telephone Encounter (Signed)
Patient taking the colchicine, it is not helping as much as she expects. I have advised her she has to return to clinic for the Prednisone.

## 2012-02-16 NOTE — Telephone Encounter (Signed)
PT STATES HER PHARMACY REQUESTED A REFILL ON HER PREDNISONE AND IT WAS DENIED. SHE DOESN'T UNDERSTAND WHY SINCE SHE WAS JUST HERE 2 WEEKS AGO AND HAVE GOUT IN HER HANDS. PLEASE CALL [phone]     [facility] [address]

## 2012-02-16 NOTE — Telephone Encounter (Signed)
Colchicine rx'ed in October for gout.  Did not discuss gout at last OV and also blood pressure elevated at that time. Prednisone is not a refillable medication.

## 2012-02-16 NOTE — Telephone Encounter (Signed)
Does she need to RTC?

## 2012-02-24 ENCOUNTER — Ambulatory Visit (INDEPENDENT_AMBULATORY_CARE_PROVIDER_SITE_OTHER): Payer: BC Managed Care – PPO | Admitting: Family Medicine

## 2012-02-24 VITALS — BP 226/127 | HR 75 | Temp 97.9°F | Resp 16 | Ht 67.0 in | Wt 248.0 lb

## 2012-02-24 DIAGNOSIS — M7710 Lateral epicondylitis, unspecified elbow: Secondary | ICD-10-CM

## 2012-02-24 DIAGNOSIS — Z0271 Encounter for disability determination: Secondary | ICD-10-CM

## 2012-02-24 DIAGNOSIS — M109 Gout, unspecified: Secondary | ICD-10-CM

## 2012-02-24 LAB — URIC ACID: Uric Acid, Serum: 10.5 mg/dL — ABNORMAL HIGH (ref 2.4–7.0)

## 2012-02-24 LAB — POCT SEDIMENTATION RATE: POCT SED RATE: 11 mm/hr (ref 0–22)

## 2012-02-24 MED ORDER — PREDNISONE 20 MG PO TABS: 20.0000 mg | ORAL_TABLET | ORAL | Status: DC | PRN

## 2012-02-24 NOTE — Progress Notes (Addendum)
Subjective:    Patient ID: Chelsea Roberts, female    DOB: 07/13/1955, 57 y.o.   MRN: [id number]  HPI  Has gout in 4th right finger which flaired last wk - has not had gout in the joint before - usually stays confined to feet and ankles but has had in knee and lower back (??) before.  Has not had gout flair in a long time but Chelsea Roberts was doing a fast w/ her church which Chelsea Roberts thinks might have triggered this Taking uloric every day.  Taking colchicine last wk - takes 6 tabs at first sign of flair - 2 tabs following by 1 tab an hour until Chelsea Roberts develops diarrhea, then 2 tabs for 2 d, then 1 tab for 3d.  Helped some. Did not have a  prednisone course to take which is what Chelsea Roberts normally needs to do. vicodin just knocks her out so such hasn't used it much.  Thinks urolic caused her tophi as Chelsea Roberts didn't develop them until after Chelsea Roberts started on the med and wants to know when they are going to go away.  Rt knee pain started this morning - a little swollen.    Thinks Chelsea Roberts must have pulled a muscle in her Rt forearm - picked up a 3 gallon thing of water on Sat and hurt immed, has been icing for the last 2d w/o improvement. Has been taking ibuprofen 400mg  bid  Past Medical History  Diagnosis Date  . Gout   . Hypertension   . Chronic kidney disease   . Hyperlipidemia   . Systolic CHF, chronic   . Gout   . Breast mass   . Complication of anesthesia     severe htn-had to take 2 ativan preop  . Anxiety   . Depression   . OSA (obstructive sleep apnea)     uses cpap--does not use   . Diabetes mellitus borderline    glucose intolerance   Current Outpatient Prescriptions on File Prior to Visit  Medication Sig Dispense Refill  . colchicine 0.6 MG tablet Take 1 tablet (0.6 mg total) by mouth daily.  60 tablet  1  . febuxostat (ULORIC) 40 MG tablet One daily  30 tablet  6  . fluticasone (FLONASE) 50 MCG/ACT nasal spray Place 2 sprays into the nose as needed.      . furosemide (LASIX) 20 MG tablet Take 1 tablet  (20 mg total) by mouth daily.  90 tablet  5  . HYDROcodone-acetaminophen (NORCO) 5-325 MG per tablet Take 1 tablet by mouth every 4 (four) hours as needed for pain.  15 tablet  0  . ibuprofen (ADVIL,MOTRIN) 200 MG tablet Take 400 mg by mouth every 6 (six) hours as needed. For pain      . LORazepam (ATIVAN) 1 MG tablet Take 1 tablet (1 mg total) by mouth 2 (two) times daily.  60 tablet  5  . Multiple Vitamin (MULITIVITAMIN WITH MINERALS) TABS Take 1 tablet by mouth daily.      Marland Kitchen olmesartan (BENICAR) 40 MG tablet Take 1 tablet (40 mg total) by mouth 2 (two) times daily.  60 tablet  11  . sertraline (ZOLOFT) 50 MG tablet Take 1 tablet (50 mg total) by mouth daily.  30 tablet  5  . cetirizine (ZYRTEC) 10 MG tablet Take 10 mg by mouth as needed.      Marland Kitchen Dextromethorphan-Guaifenesin (MUCINEX DM) 30-600 MG TB12 Take 1 tablet by mouth 2 (two) times daily.  28 each  0  .  labetalol (NORMODYNE) 100 MG tablet Take 1 tablet (100 mg total) by mouth 3 (three) times daily.  90 tablet  5  . levothyroxine (LEVOTHROID) 25 MCG tablet Take 1 tablet (25 mcg total) by mouth daily.  90 tablet  2   Allergies  Allergen Reactions  . Procardia (Nifedipine)   . Sulfa Antibiotics Swelling  . Tekturna (Aliskiren) Swelling     Review of Systems  Constitutional: Positive for activity change. Negative for fever, chills and unexpected weight change.  Musculoskeletal: Positive for myalgias, joint swelling, arthralgias and gait problem. Negative for back pain.  Skin: Negative for color change and rash.  Neurological: Negative for weakness and numbness.  Hematological: Negative for adenopathy.      BP 226/127  Pulse 75  Temp 97.9 F (36.6 C) (Oral)  Resp 16  Ht 5\' 7"  (1.702 m)  Wt 248 lb (112.492 kg)  BMI 38.84 kg/m2  SpO2 98% Objective:   Physical Exam  Constitutional: Chelsea Roberts is oriented to person, place, and time. Chelsea Roberts appears well-developed and well-nourished. No distress.  HENT:  Head: Normocephalic and  atraumatic.  Right Ear: External ear normal.  Eyes: Conjunctivae normal are normal. No scleral icterus.  Pulmonary/Chest: Effort normal.  Musculoskeletal:       Right elbow: Chelsea Roberts exhibits normal range of motion, no swelling, no effusion, no deformity and no laceration. tenderness found. Lateral epicondyle tenderness noted.       Right knee: Normal. Chelsea Roberts exhibits normal range of motion, no swelling, no effusion and normal alignment.       Lateral epicondyle pain with resisted wrist flexion < extension  Gouty tophi over PIP joint in Rt 4th finger  Crepitus in Rt knee  Neurological: Chelsea Roberts is alert and oriented to person, place, and time.  Skin: Skin is warm and dry. Chelsea Roberts is not diaphoretic. No erythema.  Psychiatric: Chelsea Roberts has a normal mood and affect. Her behavior is normal.      Results for orders placed in visit on 02/24/12  POCT SEDIMENTATION RATE      Component Value Range   POCT SED RATE 11  0 - 22 mm/hr    Assessment & Plan:   1. Gout - Cont uloric and prn colcrys. Start prednisone burst. POCT SEDIMENTATION RATE, Uric acid, predniSONE (DELTASONE) 20 MG tablet  2. Lateral epicondylitis  of elbow  Rec tennis elbow band constantly while awake x 6 wks. If pain persists or worsens, RTC to cons PT vs ortho referral  Hopefully knee pain will improve w/ steroid burst, if not, RTC for further eval Meds ordered this encounter  Medications  . predniSONE (DELTASONE) 20 MG tablet    Sig: Take 1 tablet (20 mg total) by mouth as needed.    Dispense:  18 tablet    Refill:  0

## 2012-02-24 NOTE — Patient Instructions (Addendum)
Pick up a tennis elbow band at the pharmacy and wear it as frequently as you can for the next 6 wks. If you continue to have pain, please return to clinic for further eval.  Lateral Epicondylitis (Tennis Elbow) with Rehab Lateral epicondylitis involves inflammation and pain around the outer portion of the elbow. The pain is caused by inflammation of the tendons in the forearm that bring back (extend) the wrist. Lateral epicondylittis is also called tennis elbow, because it is very common in tennis players. However, it may occur in any individual who extends the wrist repetitively. If lateral epicondylitis is left untreated, it may become a chronic problem. SYMPTOMS   Pain, tenderness, and inflammation on the outer (lateral) side of the elbow.  Pain or weakness with gripping activities.  Pain that increases with wrist twisting motions (playing tennis, using a screwdriver, opening a door or a jar).  Pain with lifting objects, including a coffee cup. CAUSES  Lateral epicondylitis is caused by inflammation of the tendons that extend the wrist. Causes of injury may include:  Repetitive stress and strain on the muscles and tendons that extend the wrist.  Sudden change in activity level or intensity.  Incorrect grip in racquet sports.  Incorrect grip size of racquet (often too large).  Incorrect hitting position or technique (usually backhand, leading with the elbow).  Using a racket that is too heavy. RISK INCREASES WITH:  Sports or occupations that require repetitive and/or strenuous forearm and wrist movements (tennis, squash, racquetball, carpentry).  Poor wrist and forearm strength and flexibility.  Failure to warm up properly before activity.  Resuming activity before healing, rehabilitation, and conditioning are complete. PREVENTION   Warm up and stretch properly before activity.  Maintain physical fitness:  Strength, flexibility, and endurance.  Cardiovascular  fitness.  Wear and use properly fitted equipment.  Learn and use proper technique and have a coach correct improper technique.  Wear a tennis elbow (counterforce) brace. PROGNOSIS  The course of this condition depends on the degree of the injury. If treated properly, acute cases (symptoms lasting less than 4 weeks) are often resolved in 2 to 6 weeks. Chronic (longer lasting cases) often resolve in 3 to 6 months, but may require physical therapy. RELATED COMPLICATIONS   Frequently recurring symptoms, resulting in a chronic problem. Properly treating the problem the first time decreases frequency of recurrence.  Chronic inflammation, scarring tendon degeneration, and partial tendon tear, requiring surgery.  Delayed healing or resolution of symptoms. TREATMENT  Treatment first involves the use of ice and medicine, to reduce pain and inflammation. Strengthening and stretching exercises may help reduce discomfort, if performed regularly. These exercises may be performed at home, if the condition is an acute injury. Chronic cases may require a referral to a physical therapist for evaluation and treatment. Your caregiver may advise a corticosteroid injection, to help reduce inflammation. Rarely, surgery is needed. MEDICATION  If pain medicine is needed, nonsteroidal anti-inflammatory medicines (aspirin and ibuprofen), or other minor pain relievers (acetaminophen), are often advised.  Do not take pain medicine for 7 days before surgery.  Prescription pain relievers may be given, if your caregiver thinks they are needed. Use only as directed and only as much as you need.  Corticosteroid injections may be recommended. These injections should be reserved only for the most severe cases, because they can only be given a certain number of times. HEAT AND COLD  Cold treatment (icing) should be applied for 10 to 15 minutes every 2 to  3 hours for inflammation and pain, and immediately after activity that  aggravates your symptoms. Use ice packs or an ice massage.  Heat treatment may be used before performing stretching and strengthening activities prescribed by your caregiver, physical therapist, or athletic trainer. Use a heat pack or a warm water soak. SEEK MEDICAL CARE IF: Symptoms get worse or do not improve in 2 weeks, despite treatment. EXERCISES  RANGE OF MOTION (ROM) AND STRETCHING EXERCISES - Epicondylitis, Lateral (Tennis Elbow) These exercises may help you when beginning to rehabilitate your injury. Your symptoms may go away with or without further involvement from your physician, physical therapist or athletic trainer. While completing these exercises, remember:   Restoring tissue flexibility helps normal motion to return to the joints. This allows healthier, less painful movement and activity.  An effective stretch should be held for at least 30 seconds.  A stretch should never be painful. You should only feel a gentle lengthening or release in the stretched tissue. RANGE OF MOTION  Wrist Flexion, Active-Assisted  Extend your right / left elbow with your fingers pointing down.*  Gently pull the back of your hand towards you, until you feel a gentle stretch on the top of your forearm.  Hold this position for __________ seconds. Repeat __________ times. Complete this exercise __________ times per day.  *If directed by your physician, physical therapist or athletic trainer, complete this stretch with your elbow bent, rather than extended. RANGE OF MOTION  Wrist Extension, Active-Assisted  Extend your right / left elbow and turn your palm upwards.*  Gently pull your palm and fingertips back, so your wrist extends and your fingers point more toward the ground.  You should feel a gentle stretch on the inside of your forearm.  Hold this position for __________ seconds. Repeat __________ times. Complete this exercise __________ times per day. *If directed by your physician,  physical therapist or athletic trainer, complete this stretch with your elbow bent, rather than extended. STRETCH - Wrist Flexion  Place the back of your right / left hand on a tabletop, leaving your elbow slightly bent. Your fingers should point away from your body.  Gently press the back of your hand down onto the table by straightening your elbow. You should feel a stretch on the top of your forearm.  Hold this position for __________ seconds. Repeat __________ times. Complete this stretch __________ times per day.  STRETCH  Wrist Extension   Place your right / left fingertips on a tabletop, leaving your elbow slightly bent. Your fingers should point backwards.  Gently press your fingers and palm down onto the table by straightening your elbow. You should feel a stretch on the inside of your forearm.  Hold this position for __________ seconds. Repeat __________ times. Complete this stretch __________ times per day.  STRENGTHENING EXERCISES - Epicondylitis, Lateral (Tennis Elbow) These exercises may help you when beginning to rehabilitate your injury. They may resolve your symptoms with or without further involvement from your physician, physical therapist or athletic trainer. While completing these exercises, remember:   Muscles can gain both the endurance and the strength needed for everyday activities through controlled exercises.  Complete these exercises as instructed by your physician, physical therapist or athletic trainer. Increase the resistance and repetitions only as guided.  You may experience muscle soreness or fatigue, but the pain or discomfort you are trying to eliminate should never worsen during these exercises. If this pain does get worse, stop and make sure you are following  the directions exactly. If the pain is still present after adjustments, discontinue the exercise until you can discuss the trouble with your caregiver. STRENGTH Wrist Flexors  Sit with your right  / left forearm palm-up and fully supported on a table or countertop. Your elbow should be resting below the height of your shoulder. Allow your wrist to extend over the edge of the surface.  Loosely holding a __________ weight, or a piece of rubber exercise band or tubing, slowly curl your hand up toward your forearm.  Hold this position for __________ seconds. Slowly lower the wrist back to the starting position in a controlled manner. Repeat __________ times. Complete this exercise __________ times per day.  STRENGTH  Wrist Extensors  Sit with your right / left forearm palm-down and fully supported on a table or countertop. Your elbow should be resting below the height of your shoulder. Allow your wrist to extend over the edge of the surface.  Loosely holding a __________ weight, or a piece of rubber exercise band or tubing, slowly curl your hand up toward your forearm.  Hold this position for __________ seconds. Slowly lower the wrist back to the starting position in a controlled manner. Repeat __________ times. Complete this exercise __________ times per day.  STRENGTH - Ulnar Deviators  Stand with a ____________________ weight in your right / left hand, or sit while holding a rubber exercise band or tubing, with your healthy arm supported on a table or countertop.  Move your wrist, so that your pinkie travels toward your forearm and your thumb moves away from your forearm.  Hold this position for __________ seconds and then slowly lower the wrist back to the starting position. Repeat __________ times. Complete this exercise __________ times per day STRENGTH - Radial Deviators  Stand with a ____________________ weight in your right / left hand, or sit while holding a rubber exercise band or tubing, with your injured arm supported on a table or countertop.  Raise your hand upward in front of you or pull up on the rubber tubing.  Hold this position for __________ seconds and then slowly  lower the wrist back to the starting position. Repeat __________ times. Complete this exercise __________ times per day. STRENGTH  Forearm Supinators   Sit with your right / left forearm supported on a table, keeping your elbow below shoulder height. Rest your hand over the edge, palm down.  Gently grip a hammer or a soup ladle.  Without moving your elbow, slowly turn your palm and hand upward to a "thumbs-up" position.  Hold this position for __________ seconds. Slowly return to the starting position. Repeat __________ times. Complete this exercise __________ times per day.  STRENGTH  Forearm Pronators   Sit with your right / left forearm supported on a table, keeping your elbow below shoulder height. Rest your hand over the edge, palm up.  Gently grip a hammer or a soup ladle.  Without moving your elbow, slowly turn your palm and hand upward to a "thumbs-up" position.  Hold this position for __________ seconds. Slowly return to the starting position. Repeat __________ times. Complete this exercise __________ times per day.  STRENGTH - Grip  Grasp a tennis ball, a dense sponge, or a large, rolled sock in your hand.  Squeeze as hard as you can, without increasing any pain.  Hold this position for __________ seconds. Release your grip slowly. Repeat __________ times. Complete this exercise __________ times per day.  STRENGTH - Elbow Extensors, Isometric  Stand or sit upright, on a firm surface. Place your right / left arm so that your palm faces your stomach, and it is at the height of your waist.  Place your opposite hand on the underside of your forearm. Gently push up as your right / left arm resists. Push as hard as you can with both arms, without causing any pain or movement at your right / left elbow. Hold this stationary position for __________ seconds. Gradually release the tension in both arms. Allow your muscles to relax completely before repeating. Document Released:  01/06/2005 Document Revised: 03/31/2011 Document Reviewed: 04/20/2008 [facility] Patient Information 2013 [place].

## 2012-02-25 MED ORDER — FEBUXOSTAT 80 MG PO TABS: 80.0000 mg | ORAL_TABLET | Freq: Every day | ORAL | Status: DC

## 2012-02-25 NOTE — Addendum Note (Signed)
Addended by: Delman Cheadle on: 02/25/2012 10:27 AM   Modules accepted: Orders

## 2012-02-26 ENCOUNTER — Telehealth: Payer: Self-pay

## 2012-02-26 NOTE — Telephone Encounter (Signed)
Gave pt instr's from Dr Brigitte Pulse - see lab result notes, but pt reported that her rheumatologist increased her uloric to 80 mg in 2012. Pt reports that she thinks the uric acid was up because she had eaten some shrimp about a day before. Dr Brigitte Pulse please advise what you would like pt to do for f/up and medication with this new info.

## 2012-02-26 NOTE — Telephone Encounter (Signed)
Patient returned our call regarding her labs.  CBN:  [phone]

## 2012-02-27 NOTE — Telephone Encounter (Signed)
Continue on the urloric 80mg  and follow a low uric acid diet. If she continues to have problems with her gout, then she should f/u w/ her rheumatologist if that is who is managing this as she might need additional prophylactic meds.

## 2012-02-27 NOTE — Telephone Encounter (Signed)
Pt CB and I gave her the instr's from Dr Brigitte Pulse. Pt agreed.

## 2012-02-27 NOTE — Telephone Encounter (Signed)
Called patient to left message for her to call back.

## 2012-03-06 ENCOUNTER — Other Ambulatory Visit: Payer: Self-pay

## 2012-03-20 ENCOUNTER — Encounter: Payer: Self-pay | Admitting: *Deleted

## 2012-03-20 DIAGNOSIS — N63 Unspecified lump in unspecified breast: Secondary | ICD-10-CM

## 2012-04-06 ENCOUNTER — Telehealth: Payer: Self-pay

## 2012-04-06 NOTE — Telephone Encounter (Signed)
Patient is out of med refills on olmesartan (BENICAR) 40 MG tablet Wants to know what she needs to do to get more   CBN:  [phone]

## 2012-04-08 ENCOUNTER — Other Ambulatory Visit: Payer: Self-pay | Admitting: Radiology

## 2012-04-08 MED ORDER — OLMESARTAN MEDOXOMIL 40 MG PO TABS: 40.0000 mg | ORAL_TABLET | Freq: Two times a day (BID) | ORAL | Status: DC

## 2012-04-15 ENCOUNTER — Ambulatory Visit (INDEPENDENT_AMBULATORY_CARE_PROVIDER_SITE_OTHER): Payer: BC Managed Care – PPO | Admitting: Emergency Medicine

## 2012-04-15 ENCOUNTER — Ambulatory Visit: Payer: BC Managed Care – PPO

## 2012-04-15 VITALS — BP 188/110 | HR 84 | Temp 98.4°F | Resp 16 | Ht 66.0 in | Wt 248.0 lb

## 2012-04-15 DIAGNOSIS — M79672 Pain in left foot: Secondary | ICD-10-CM

## 2012-04-15 DIAGNOSIS — I10 Essential (primary) hypertension: Secondary | ICD-10-CM

## 2012-04-15 DIAGNOSIS — R2232 Localized swelling, mass and lump, left upper limb: Secondary | ICD-10-CM

## 2012-04-15 DIAGNOSIS — M79609 Pain in unspecified limb: Secondary | ICD-10-CM

## 2012-04-15 DIAGNOSIS — M79641 Pain in right hand: Secondary | ICD-10-CM

## 2012-04-15 DIAGNOSIS — R609 Edema, unspecified: Secondary | ICD-10-CM

## 2012-04-15 DIAGNOSIS — M109 Gout, unspecified: Secondary | ICD-10-CM

## 2012-04-15 MED ORDER — HYDROCODONE-ACETAMINOPHEN 5-325 MG PO TABS: 1.0000 | ORAL_TABLET | ORAL | Status: DC | PRN

## 2012-04-15 MED ORDER — COLCHICINE 0.6 MG PO TABS: 0.6000 mg | ORAL_TABLET | Freq: Every day | ORAL | Status: DC

## 2012-04-15 MED ORDER — FEBUXOSTAT 80 MG PO TABS: 80.0000 mg | ORAL_TABLET | Freq: Every day | ORAL | Status: DC

## 2012-04-15 MED ORDER — LABETALOL HCL 200 MG PO TABS: 200.0000 mg | ORAL_TABLET | Freq: Two times a day (BID) | ORAL | Status: DC

## 2012-04-15 NOTE — Progress Notes (Signed)
  Subjective:    Patient ID: Chelsea Roberts, female    DOB: 10-06-1955, 57 y.o.   MRN: [id number]  HPI patient comes into our office with right hand pain that started Tuesday morning when she woke up. She cant use her right hand to work her hand is real tender to the touch with some swelling. Patient has a HX of gout she has a long history of gout. She follows her diet very closely but is having severe discomfort in the dorsum of her right hand. She also has a strap on her right arm for treatment of chronic tennis elbow.    Review of Systems  Neurological: Positive for weakness and numbness.       Objective:   Physical Exam  Constitutional: She appears well-developed.   There is a 4 x 4 inch area of redness on the dorsum of the right hand central to this area is a 1 x 1 cm firm area. There is swelling in approximately 1 cm by half a centimeter along the lateral portion of the right fourth finger. There is a firm area over the PIP joint of the left middle finger. 5 months ago her uric acid was 5.6 one month ago was 10.5 [facility] reading (PRIMARY) by  Dr. Everlene Farrier is soft tissue swelling around the right middle finger no other abnormalities      Assessment & Plan:  Patient needs to be back on a good dose of her uloric her on prednisone because of the elevated blood pressure. I did increase her labetalol to 200 mg twice a day I placed her on call Gerald Stabs 0.63 times a day with hydrocodone for pain he

## 2012-04-16 NOTE — Telephone Encounter (Signed)
Note provided advised patient it is ready for pick up. She had me fax to [phone]

## 2012-04-16 NOTE — Telephone Encounter (Signed)
Patient was seen yesterday, needs an [facility] note for today, because of her hand and high blood pressure  Fax  [phone]  CBN:  [phone]

## 2012-05-05 ENCOUNTER — Emergency Department (HOSPITAL_COMMUNITY)
Admission: EM | Admit: 2012-05-05 | Discharge: 2012-05-05 | Disposition: A | Discharge status: Home / Self Care | Payer: BC Managed Care – PPO | Attending: Emergency Medicine | Admitting: Emergency Medicine

## 2012-05-05 ENCOUNTER — Emergency Department (HOSPITAL_COMMUNITY): Payer: BC Managed Care – PPO

## 2012-05-05 ENCOUNTER — Encounter (HOSPITAL_COMMUNITY): Payer: Self-pay | Admitting: Emergency Medicine

## 2012-05-05 DIAGNOSIS — I129 Hypertensive chronic kidney disease with stage 1 through stage 4 chronic kidney disease, or unspecified chronic kidney disease: Secondary | ICD-10-CM | POA: Insufficient documentation

## 2012-05-05 DIAGNOSIS — F3289 Other specified depressive episodes: Secondary | ICD-10-CM | POA: Insufficient documentation

## 2012-05-05 DIAGNOSIS — G4733 Obstructive sleep apnea (adult) (pediatric): Secondary | ICD-10-CM | POA: Insufficient documentation

## 2012-05-05 DIAGNOSIS — F411 Generalized anxiety disorder: Secondary | ICD-10-CM | POA: Insufficient documentation

## 2012-05-05 DIAGNOSIS — R51 Headache: Secondary | ICD-10-CM

## 2012-05-05 DIAGNOSIS — F329 Major depressive disorder, single episode, unspecified: Secondary | ICD-10-CM | POA: Insufficient documentation

## 2012-05-05 DIAGNOSIS — I5022 Chronic systolic (congestive) heart failure: Secondary | ICD-10-CM | POA: Insufficient documentation

## 2012-05-05 DIAGNOSIS — E119 Type 2 diabetes mellitus without complications: Secondary | ICD-10-CM | POA: Insufficient documentation

## 2012-05-05 DIAGNOSIS — Z79899 Other long term (current) drug therapy: Secondary | ICD-10-CM | POA: Insufficient documentation

## 2012-05-05 DIAGNOSIS — M109 Gout, unspecified: Secondary | ICD-10-CM

## 2012-05-05 DIAGNOSIS — Z8742 Personal history of other diseases of the female genital tract: Secondary | ICD-10-CM | POA: Insufficient documentation

## 2012-05-05 DIAGNOSIS — N189 Chronic kidney disease, unspecified: Secondary | ICD-10-CM | POA: Insufficient documentation

## 2012-05-05 DIAGNOSIS — I10 Essential (primary) hypertension: Secondary | ICD-10-CM

## 2012-05-05 DIAGNOSIS — R0602 Shortness of breath: Secondary | ICD-10-CM | POA: Insufficient documentation

## 2012-05-05 DIAGNOSIS — E785 Hyperlipidemia, unspecified: Secondary | ICD-10-CM | POA: Insufficient documentation

## 2012-05-05 DIAGNOSIS — IMO0002 Reserved for concepts with insufficient information to code with codable children: Secondary | ICD-10-CM | POA: Insufficient documentation

## 2012-05-05 LAB — CBC WITH DIFFERENTIAL/PLATELET
Basophils Relative: 0 % (ref 0–1)
Eosinophils Absolute: 0 10*3/uL (ref 0.0–0.7)
Eosinophils Relative: 0 % (ref 0–5)
Hemoglobin: 14.1 g/dL (ref 12.0–15.0)
Lymphs Abs: 2.3 10*3/uL (ref 0.7–4.0)
MCH: 27 pg (ref 26.0–34.0)
MCHC: 33.8 g/dL (ref 30.0–36.0)
MCV: 79.7 fL (ref 78.0–100.0)
Monocytes Absolute: 0.6 10*3/uL (ref 0.1–1.0)
Monocytes Relative: 6 % (ref 3–12)
RBC: 5.23 MIL/uL — ABNORMAL HIGH (ref 3.87–5.11)

## 2012-05-05 LAB — PRO B NATRIURETIC PEPTIDE: Pro B Natriuretic peptide (BNP): 497.9 pg/mL — ABNORMAL HIGH (ref 0–125)

## 2012-05-05 LAB — COMPREHENSIVE METABOLIC PANEL
AST: 24 U/L (ref 0–37)
Albumin: 3.3 g/dL — ABNORMAL LOW (ref 3.5–5.2)
BUN: 30 mg/dL — ABNORMAL HIGH (ref 6–23)
Chloride: 104 mEq/L (ref 96–112)
Creatinine, Ser: 1.36 mg/dL — ABNORMAL HIGH (ref 0.50–1.10)
Potassium: 4.4 mEq/L (ref 3.5–5.1)
Total Bilirubin: 0.3 mg/dL (ref 0.3–1.2)
Total Protein: 6.9 g/dL (ref 6.0–8.3)

## 2012-05-05 LAB — TROPONIN I: Troponin I: <0.3 ng/mL (ref <0.30)

## 2012-05-05 MED ORDER — DIPHENHYDRAMINE HCL 50 MG/ML IJ SOLN
25.0000 mg | Freq: Once | INTRAMUSCULAR | Status: AC
  Administered 2012-05-05: 25 mg via INTRAVENOUS
  Filled 2012-05-05: qty 1

## 2012-05-05 MED ORDER — METOCLOPRAMIDE HCL 5 MG/ML IJ SOLN
10.0000 mg | Freq: Once | INTRAMUSCULAR | Status: AC
  Administered 2012-05-05: 10 mg via INTRAVENOUS
  Filled 2012-05-05: qty 2

## 2012-05-05 MED ORDER — IRBESARTAN 300 MG PO TABS
300.0000 mg | ORAL_TABLET | Freq: Once | ORAL | Status: AC
  Administered 2012-05-05: 300 mg via ORAL
  Filled 2012-05-05: qty 1

## 2012-05-05 MED ORDER — METOPROLOL TARTRATE 1 MG/ML IV SOLN
10.0000 mg | Freq: Once | INTRAVENOUS | Status: AC
  Administered 2012-05-05: 10 mg via INTRAVENOUS
  Filled 2012-05-05: qty 10

## 2012-05-05 MED ORDER — LABETALOL HCL 200 MG PO TABS
200.0000 mg | ORAL_TABLET | Freq: Once | ORAL | Status: AC
  Administered 2012-05-05: 200 mg via ORAL
  Filled 2012-05-05: qty 1

## 2012-05-05 MED ORDER — MORPHINE SULFATE 4 MG/ML IJ SOLN
8.0000 mg | Freq: Once | INTRAMUSCULAR | Status: AC
  Administered 2012-05-05: 8 mg via INTRAVENOUS
  Filled 2012-05-05: qty 2

## 2012-05-05 MED ORDER — PREDNISONE 20 MG PO TABS
60.0000 mg | ORAL_TABLET | Freq: Once | ORAL | Status: AC
  Administered 2012-05-05: 60 mg via ORAL
  Filled 2012-05-05: qty 3

## 2012-05-05 MED ORDER — HYDROCODONE-ACETAMINOPHEN 5-325 MG PO TABS: 2.0000 | ORAL_TABLET | ORAL | Status: DC | PRN

## 2012-05-05 MED ORDER — HYDRALAZINE HCL 20 MG/ML IJ SOLN
20.0000 mg | Freq: Once | INTRAMUSCULAR | Status: AC
  Administered 2012-05-05: 20 mg via INTRAVENOUS
  Filled 2012-05-05: qty 1

## 2012-05-05 MED ORDER — PREDNISONE 20 MG PO TABS: ORAL_TABLET | ORAL | Status: DC

## 2012-05-05 NOTE — ED Notes (Signed)
Patient transported to CT 

## 2012-05-05 NOTE — ED Notes (Signed)
Pt states that gout started last Wed.

## 2012-05-05 NOTE — ED Provider Notes (Addendum)
History     CSN: [id number]  Arrival date & time 05/05/12  0846   First MD Initiated Contact with Patient 05/05/12 [phone]      Chief Complaint  Patient presents with  . Gout    (Consider location/radiation/quality/duration/timing/severity/associated sxs/prior treatment) The history is provided by the patient.  Chelsea Roberts is a 57 y.o. female history of gout, CK D., hypertension, heart failure here presenting with shortness of breath and left hand pain. Left hand pain for the last 3 days. It felt like it was for gout attack. No fevers or chills. Took her colchisine without any relief. She had a similar episode 2 weeks ago and was given colchicine and Norco. She had x-ray of the left hand 2 weeks ago that was unremarkable. However today she also was found to be hypertensive and she says she has some shortness of breath. Denies any chest pain. She also has some headaches that intermittent for the last several days. Denies any weakness or numbness.    Past Medical History  Diagnosis Date  . Gout   . Hypertension   . Chronic kidney disease   . Hyperlipidemia   . Systolic CHF, chronic   . Gout   . Breast mass   . Complication of anesthesia     severe htn-had to take 2 ativan preop  . Anxiety   . Depression   . OSA (obstructive sleep apnea)     uses cpap--does not use   . Diabetes mellitus borderline    glucose intolerance    Past Surgical History  Procedure Laterality Date  . Abdominal hysterectomy  1999    partial  . Breast biopsy  01/01/2012    Procedure: BREAST BIOPSY WITH NEEDLE LOCALIZATION;  Surgeon: Haywood Lasso, MD;  Location: [facility];  Service: General;  Laterality: Right;  Needle localization excision Right breast mass    Family History  Problem Relation Age of Onset  . Heart failure Sister   . Diabetes Sister   . Hypertension Sister   . Kidney disease Sister   . Hypertension Sister   . Diabetes Sister   . Hypertension Mother   .  Hypertension Sister   . Diabetes type II Sister   . Cancer Father     lung cancer; smoker and asbestos exposure  . Diabetes Father   . Cancer Maternal Aunt     ovarian  . Cancer Maternal Grandmother     breast    History  Substance Use Topics  . Smoking status: Never Smoker   . Smokeless tobacco: Never Used  . Alcohol Use: No    OB History   Grav Para Term Preterm Abortions TAB SAB Ect Mult Living                  Review of Systems  Respiratory: Positive for shortness of breath.   Musculoskeletal:       R hand pain   Neurological: Positive for headaches.  All other systems reviewed and are negative.    Allergies  Procardia; Sulfa antibiotics; and Tekturna  Home Medications   Current Outpatient Rx  Name  Route  Sig  Dispense  Refill  . Dextromethorphan-Guaifenesin (MUCINEX DM) 30-600 MG TB12   Oral   Take 1 tablet by mouth 2 (two) times daily.   28 each   0   . Febuxostat (ULORIC) 80 MG TABS   Oral   Take 1 tablet (80 mg total) by mouth daily.  30 tablet   5   . fluticasone (FLONASE) 50 MCG/ACT nasal spray   Nasal   Place 2 sprays into the nose as needed.         . furosemide (LASIX) 20 MG tablet   Oral   Take 1 tablet (20 mg total) by mouth daily.   90 tablet   5   . HYDROcodone-acetaminophen (NORCO) 5-325 MG per tablet   Oral   Take 1 tablet by mouth every 4 (four) hours as needed for pain.   15 tablet   0   . labetalol (NORMODYNE) 200 MG tablet   Oral   Take 1 tablet (200 mg total) by mouth 2 (two) times daily.   60 tablet   5   . LORazepam (ATIVAN) 1 MG tablet   Oral   Take 1 tablet (1 mg total) by mouth 2 (two) times daily.   60 tablet   5     Needs ov before more refills are given   . Multiple Vitamin (MULITIVITAMIN WITH MINERALS) TABS   Oral   Take 1 tablet by mouth daily.         Marland Kitchen olmesartan (BENICAR) 40 MG tablet   Oral   Take 1 tablet (40 mg total) by mouth 2 (two) times daily.   60 tablet   2   . predniSONE  (DELTASONE) 20 MG tablet   Oral   Take 1 tablet (20 mg total) by mouth as needed.   18 tablet   0   . colchicine 0.6 MG tablet   Oral   Take 1 tablet (0.6 mg total) by mouth daily.   30 tablet   0     BP 190/104  Pulse 74  Temp(Src) 98.6 F (37 C) (Oral)  Resp 18  SpO2 100%  Physical Exam  Nursing note and vitals reviewed. Constitutional: She is oriented to person, place, and time. She appears well-developed and well-nourished.  Uncomfortable   HENT:  Head: Normocephalic and atraumatic.  Mouth/Throat: Oropharynx is clear and moist.  Eyes: Conjunctivae are normal. Pupils are equal, round, and reactive to light.  Neck: Normal range of motion. Neck supple.  Cardiovascular: Normal rate, regular rhythm and normal heart sounds.   Pulmonary/Chest: Effort normal and breath sounds normal. No respiratory distress. She has no wheezes. She has no rales.  Abdominal: Soft. Bowel sounds are normal. She exhibits no distension. There is no tenderness. There is no rebound and no guarding.  Musculoskeletal: Normal range of motion. She exhibits no edema and no tenderness.  L hand diffusely swollen, no erythema or fluctuance, minimally warm, nl ROM of fingers.   Neurological: She is alert and oriented to person, place, and time. No cranial nerve deficit. Coordination normal.  Skin: Skin is warm and dry.  Psychiatric: She has a normal mood and affect. Her behavior is normal. Judgment and thought content normal.    ED Course  Procedures (including critical care time)  Labs Reviewed  CBC WITH DIFFERENTIAL - Abnormal; Notable for the following:    RBC 5.23 (*)    All other components within normal limits  COMPREHENSIVE METABOLIC PANEL - Abnormal; Notable for the following:    Glucose, Bld 154 (*)    BUN 30 (*)    Creatinine, Ser 1.36 (*)    Albumin 3.3 (*)    GFR calc non Af Amer 43 (*)    GFR calc Af Amer 50 (*)    All other components within normal limits  PRO  B NATRIURETIC PEPTIDE -  Abnormal; Notable for the following:    Pro B Natriuretic peptide (BNP) 497.9 (*)    All other components within normal limits  TROPONIN I   Dg Chest 2 View  05/05/2012  *RADIOLOGY REPORT*  Clinical Data: Short of breath.  Gout.  Tingling in the face.  CHEST - 2 VIEW  Comparison: 04/01/2011.  Findings: Low volume chest. Cardiopericardial silhouette is similar to prior exam.  There may be mild cardiomegaly however the low volumes accentuate the cardiopericardial silhouette.  No airspace disease.  No effusion. Monitoring leads are projected over the chest.  Trachea midline.  Mediastinal contours appear normal and unchanged.  IMPRESSION: Low volume chest.  No interval change.   Original Report Authenticated By: Dereck Ligas, M.D.    Ct Head Wo Contrast  05/05/2012  *RADIOLOGY REPORT*  Clinical Data: Severe headache.  Hypertension.  CT HEAD WITHOUT CONTRAST  Technique:  Contiguous axial images were obtained from the base of the skull through the vertex without contrast.  Comparison: Head CT scan 04/19/2009.  Findings: No evidence of acute intracranial abnormality including infarct, hemorrhage, mass lesion, mass effect, midline shift or abnormal extra-axial fluid collection is identified. Dolichoectatic vertebral basilar system is noted.  Atherosclerotic vascular disease is identified.  The calvarium is intact.  IMPRESSION:  1.  No acute finding. 2.  Atherosclerosis.   Original Report Authenticated By: Orlean Patten, M.D.      No diagnosis found.   Date: 05/05/2012  Rate: 83  Rhythm: normal sinus rhythm  QRS Axis: normal  Intervals: PR prolonged  ST/T Wave abnormalities: normal  Conduction Disutrbances:first-degree A-V block   Narrative Interpretation: 1st degree AV block new  Old EKG Reviewed: changes noted    MDM  Chelsea Roberts is a 57 y.o. female here with SOB, headache, worsening gout. Will give steroids and pain meds for gout. Not concerned for septic joint. Given hx of CHF,  will r/o CHF vs acs. Headache likely from hypertension. Neuro exam unremarkable and so will not do imaging right away. Will treat hypertension and pain and reassess headache.   11 AM Patient still hypertensive but headache improved. CT head ordered to r/o bleed given headache and hypertension. Trop neg x 1 and symptoms for more than a day. Also BNP at baseline.   1:59 PM BP dec to 200/100 from 240/130 on arrival after PO meds and lopressor and hydralazine IV. Will increase her labetalol to 300 mg BID from 200 mg BID. Will d/c home on prednisone taper and norco for gout. Will have her f/u with PMD in a week.        Wandra Arthurs, MD 05/05/12 McClure Yao, MD 05/05/12 [phone]

## 2012-05-29 ENCOUNTER — Ambulatory Visit (INDEPENDENT_AMBULATORY_CARE_PROVIDER_SITE_OTHER): Payer: BC Managed Care – PPO | Admitting: Family Medicine

## 2012-05-29 VITALS — BP 216/118 | HR 81 | Temp 98.1°F | Resp 18 | Ht 67.0 in | Wt 244.8 lb

## 2012-05-29 DIAGNOSIS — M79672 Pain in left foot: Secondary | ICD-10-CM

## 2012-05-29 DIAGNOSIS — I10 Essential (primary) hypertension: Secondary | ICD-10-CM

## 2012-05-29 DIAGNOSIS — M79609 Pain in unspecified limb: Secondary | ICD-10-CM

## 2012-05-29 DIAGNOSIS — R7303 Prediabetes: Secondary | ICD-10-CM

## 2012-05-29 DIAGNOSIS — N189 Chronic kidney disease, unspecified: Secondary | ICD-10-CM

## 2012-05-29 DIAGNOSIS — M109 Gout, unspecified: Secondary | ICD-10-CM

## 2012-05-29 LAB — POCT CBC
HCT, POC: 45.5 % (ref 37.7–47.9)
Hemoglobin: 14.2 g/dL (ref 12.2–16.2)
Lymph, poc: 2.4 (ref 0.6–3.4)
MCH, POC: 26.6 pg — AB (ref 27–31.2)
MCHC: 31.2 g/dL — AB (ref 31.8–35.4)
MCV: 85.4 fL (ref 80–97)
POC Granulocyte: 12 — AB (ref 2–6.9)
POC LYMPH PERCENT: 16 %L (ref 10–50)
RDW, POC: 15.7 %
WBC: 14.9 10*3/uL — AB (ref 4.6–10.2)

## 2012-05-29 LAB — COMPREHENSIVE METABOLIC PANEL
Alkaline Phosphatase: 81 U/L (ref 39–117)
BUN: 34 mg/dL — ABNORMAL HIGH (ref 6–23)
CO2: 26 mEq/L (ref 19–32)
Creat: 1.69 mg/dL — ABNORMAL HIGH (ref 0.50–1.10)
Glucose, Bld: 113 mg/dL — ABNORMAL HIGH (ref 70–99)
Total Bilirubin: 0.5 mg/dL (ref 0.3–1.2)
Total Protein: 6.8 g/dL (ref 6.0–8.3)

## 2012-05-29 LAB — POCT GLYCOSYLATED HEMOGLOBIN (HGB A1C): Hemoglobin A1C: 6.3

## 2012-05-29 LAB — GLUCOSE, POCT (MANUAL RESULT ENTRY): POC Glucose: 101 mg/dl — AB (ref 70–99)

## 2012-05-29 MED ORDER — LABETALOL HCL 200 MG PO TABS: 200.0000 mg | ORAL_TABLET | Freq: Three times a day (TID) | ORAL | Status: DC

## 2012-05-29 MED ORDER — PREDNISONE 20 MG PO TABS: 40.0000 mg | ORAL_TABLET | Freq: Every day | ORAL | Status: DC

## 2012-05-29 MED ORDER — COLCHICINE 0.6 MG PO TABS: 0.6000 mg | ORAL_TABLET | Freq: Every day | ORAL | Status: DC

## 2012-05-29 MED ORDER — CLONIDINE HCL 0.1 MG/24HR TD PTWK: 1.0000 | MEDICATED_PATCH | TRANSDERMAL | Status: DC

## 2012-05-29 NOTE — Progress Notes (Signed)
Subjective:    Patient ID: Chelsea Roberts, female    DOB: [birth year], 57 y.o.   MRN: [id number]   Chief Complaint  Patient presents with  . Gout    right knee    HPI  Woke up yesterday morning with Rt knee pain and now worse.  Did not have any colchicine or prednisone at home and not supposed to use nsaids to due to CRF.  Taking uroloric 80mg  daily.  Not missed any.  She was on a diet for the past few d w/ her church and thinks maybe that triggered the flair.    She does have rheumatologist - Dr. Marijean Bravo - for her severe, recurrent gout despite aggressive medical therapy but it is difficult to get an appt.  Her nephrologist is Dr. Hassell Done. Sees Dr. Percival Spanish for cardiology, he has been consulting on her labile and recalcitrant HTN. Hydralazine gave her gout Procardia and amlodipine cause severe swelling hctz and increase diuretics cause gout maxed  Clonidine makes her sleepy. She is taking her labatolol 200mg  tid - breakfast, lunch, and 4 p.m.  She does not want to consolidate to tid and she states she has increased her dose to 800mg  total in the past and she was so fatigued she couldn't even function.  Checking BP outside office and tends to go up while at work or during gout flair.  When she is at home and pain free - not having gout flair - her BP is about 130/80  Review of Systems  Constitutional: Positive for activity change and fatigue. Negative for fever, chills and unexpected weight change.  Respiratory: Negative for chest tightness and shortness of breath.   Cardiovascular: Positive for leg swelling. Negative for chest pain.  Musculoskeletal: Positive for myalgias, joint swelling, arthralgias and gait problem. Negative for back pain.  Skin: Negative for color change and rash.  Neurological: Negative for weakness and numbness.  Psychiatric/Behavioral: Positive for sleep disturbance.      BP 216/118  Pulse 81  Temp(Src) 98.1 F (36.7 C) (Oral)  Resp 18  Ht 5\' 7"  (1.702 m)   Wt 244 lb 12.8 oz (111.041 kg)  BMI 38.33 kg/m2  SpO2 98% Objective:   Physical Exam  Constitutional: She is oriented to person, place, and time. She appears well-developed and well-nourished. No distress.  HENT:  Head: Normocephalic and atraumatic.  Right Ear: External ear normal.  Left Ear: External ear normal.  Eyes: Conjunctivae are normal. No scleral icterus.  Neck: Normal range of motion. Neck supple. No thyromegaly present.  Cardiovascular: Normal rate, regular rhythm, normal heart sounds and intact distal pulses.   Pulmonary/Chest: Effort normal and breath sounds normal. No respiratory distress.  Musculoskeletal: She exhibits no edema.       Right knee: She exhibits decreased range of motion, swelling and effusion. She exhibits no ecchymosis and no erythema. Tenderness found.  Diffuse tenderness and mild warmth over entire anterior right knee.  Lymphadenopathy:    She has no cervical adenopathy.  Neurological: She is alert and oriented to person, place, and time.  Skin: Skin is warm and dry. She is not diaphoretic. No erythema.  Psychiatric: She has a normal mood and affect. Her behavior is normal.   Risks/benefits of aspiration and injection reviewed and informed consent obtained.  Knee positioned at 10 deg flexion, cleaned with betadine x 2.  Anesthesia w/ ethyl chloride cold spray. Attempted aspiration of effusion by superior lateral approach to suprapatellar bursa w/o success, no fluid obtained despite several  attempts at repositioning needle (though needle never w/d from skin) and injected with 40mg  of Kenalog and 5cc of 1% lidocaine using 20g 1 1/2in needle without complications. Pt tolerated procedure well. No EBL.    Results for orders placed in visit on 05/29/12  POCT CBC      Result Value Range   WBC 14.9 (*) 4.6 - 10.2 K/uL   Lymph, poc 2.4  0.6 - 3.4   POC LYMPH PERCENT 16.0  10 - 50 %L   MID (cbc) 0.6  0 - 0.9   POC MID % 3.7  0 - 12 %M   POC Granulocyte 12.0  (*) 2 - 6.9   Granulocyte percent 80.3 (*) 37 - 80 %G   RBC 5.33  4.04 - 5.48 M/uL   Hemoglobin 14.2  12.2 - 16.2 g/dL   HCT, POC 45.5  37.7 - 47.9 %   MCV 85.4  80 - 97 fL   MCH, POC 26.6 (*) 27 - 31.2 pg   MCHC 31.2 (*) 31.8 - 35.4 g/dL   RDW, POC 15.7     Platelet Count, POC 229  142 - 424 K/uL   MPV 11.7  0 - 99.8 fL  POCT GLYCOSYLATED HEMOGLOBIN (HGB A1C)      Result Value Range   Hemoglobin A1C 6.3    GLUCOSE, POCT (MANUAL RESULT ENTRY)      Result Value Range   POC Glucose 101 (*) 70 - 99 mg/dl     eGFR 51 w/ last labs Assessment & Plan:  Gout - Plan: Uric acid, POCT CBC, Comprehensive metabolic panel, POCT glycosylated hemoglobin (Hb A1C), POCT glucose (manual entry), colchicine 0.6 MG tablet - check uric acid as presumed still high despite 80mg  of Uloric daily due to numerous continuing gout flairs.  Start colchicine 0.6mg  daily but increase to 2 tabs po x 1 followed by 1 tab po q2 hrs for total of 6 tabs or onset of diarrhea at first sign of a flair. No NSAIDs due to stg 3 crf.  Pt stated knee felt MUCH better after injection by the time she left the office.  HTN (hypertension) - cont labetalol, maxed out on benicar, has contraindiations to almost every other med and bp very labile. F/u w/ cardiology asap - in the meantime, pt willing to do trial of clonidine patch - hopefully less side effects due to very slow release  Chronic kidney disease - recheck bmp as may have to tailor med doses soon due to variable gfr.  Meds ordered this encounter  Medications  . DISCONTD: labetalol (NORMODYNE) 200 MG tablet    Sig: Take 300 mg by mouth 2 (two) times daily.  . sertraline (ZOLOFT) 50 MG tablet    Sig: Take 50 mg by mouth daily.  . colchicine 0.6 MG tablet    Sig: Take 1 tablet (0.6 mg total) by mouth daily. And increase dose as directed during gout flair    Dispense:  60 tablet    Refill:  3  . cloNIDine (CATAPRES - DOSED IN MG/24 HR) 0.1 mg/24hr patch    Sig: Place 1 patch  (0.1 mg total) onto the skin once a week.    Dispense:  4 patch    Refill:  0  . labetalol (NORMODYNE) 200 MG tablet    Sig: Take 1 tablet (200 mg total) by mouth 3 (three) times daily.    Dispense:  90 tablet    Refill:  0  . predniSONE (DELTASONE) 20  MG tablet    Sig: Take 2 tablets (40 mg total) by mouth daily.    Dispense:  10 tablet    Refill:  0    H/o hypothyroidism in problem list but none seen in actual history  Not on replacement and labs normal

## 2012-05-31 DIAGNOSIS — R7303 Prediabetes: Secondary | ICD-10-CM | POA: Insufficient documentation

## 2012-07-02 ENCOUNTER — Ambulatory Visit (INDEPENDENT_AMBULATORY_CARE_PROVIDER_SITE_OTHER): Payer: BC Managed Care – PPO | Admitting: Family Medicine

## 2012-07-02 VITALS — BP 190/100 | HR 69 | Temp 98.0°F | Resp 16 | Ht 65.5 in | Wt 238.0 lb

## 2012-07-02 DIAGNOSIS — B3731 Acute candidiasis of vulva and vagina: Secondary | ICD-10-CM

## 2012-07-02 DIAGNOSIS — N760 Acute vaginitis: Secondary | ICD-10-CM

## 2012-07-02 DIAGNOSIS — A499 Bacterial infection, unspecified: Secondary | ICD-10-CM

## 2012-07-02 DIAGNOSIS — Z0000 Encounter for general adult medical examination without abnormal findings: Secondary | ICD-10-CM

## 2012-07-02 DIAGNOSIS — B9689 Other specified bacterial agents as the cause of diseases classified elsewhere: Secondary | ICD-10-CM

## 2012-07-02 DIAGNOSIS — I10 Essential (primary) hypertension: Secondary | ICD-10-CM

## 2012-07-02 DIAGNOSIS — B373 Candidiasis of vulva and vagina: Secondary | ICD-10-CM

## 2012-07-02 LAB — POCT CBC
Granulocyte percent: 64.4 %G (ref 37–80)
HCT, POC: 46.6 % (ref 37.7–47.9)
Hemoglobin: 14.7 g/dL (ref 12.2–16.2)
Lymph, poc: 3.1 (ref 0.6–3.4)
MCH, POC: 26.8 pg — AB (ref 27–31.2)
MCHC: 31.5 g/dL — AB (ref 31.8–35.4)
MCV: 84.9 fL (ref 80–97)
MID (cbc): 0.6 (ref 0–0.9)
MPV: 11.9 fL (ref 0–99.8)
POC Granulocyte: 6.6 (ref 2–6.9)
POC LYMPH PERCENT: 29.9 %L (ref 10–50)
POC MID %: 5.7 % (ref 0–12)
Platelet Count, POC: 219 10*3/uL (ref 142–424)
RBC: 5.49 M/uL — AB (ref 4.04–5.48)
RDW, POC: 14.8 %
WBC: 10.3 10*3/uL — AB (ref 4.6–10.2)

## 2012-07-02 LAB — POCT WET PREP WITH KOH
KOH Prep POC: POSITIVE
Trichomonas, UA: NEGATIVE
Yeast Wet Prep HPF POC: POSITIVE

## 2012-07-02 LAB — POCT UA - MICROSCOPIC ONLY
Casts, Ur, LPF, POC: NEGATIVE
Crystals, Ur, HPF, POC: NEGATIVE
Yeast, UA: POSITIVE

## 2012-07-02 LAB — POCT URINALYSIS DIPSTICK
Bilirubin, UA: NEGATIVE
Glucose, UA: NEGATIVE
Ketones, UA: NEGATIVE
Nitrite, UA: POSITIVE
Protein, UA: 100
Spec Grav, UA: 1.02
Urobilinogen, UA: 0.2
pH, UA: 5.5

## 2012-07-02 LAB — IFOBT (OCCULT BLOOD): IFOBT: NEGATIVE

## 2012-07-02 LAB — LDL CHOLESTEROL, DIRECT: Direct LDL: 191 mg/dL — ABNORMAL HIGH

## 2012-07-02 MED ORDER — CARVEDILOL 25 MG PO TABS: ORAL_TABLET | ORAL | Status: DC

## 2012-07-02 NOTE — Progress Notes (Signed)
[facility]:  Office Visit  Chief Complaint:  Chief Complaint  Patient presents with  . Gynecologic Exam    HPI: Chelsea Roberts is a 57 y.o. female who complains of  Annual exam.  1. Here for pap smear but she had a partial hysterectomy where cervix and uterus taken out for fibroids 1999. No prior history of abnormal paps. Last pelvic was 2010. Last mamogram was in Oct 2013, she had right breast biospy due to mass with pathology being benign 2. Does not use CPAP due to mask makes her feel clausterphobic. HTN not well controlled  3. Has no vaginal sxs, no urinary sxs, She douches.   Nephrologist is Dr. Hassell Done.  Cardiologist is Dr. Percival Spanish, he has been consulting on her labile and recalcitrant HTN.  Hydralazine gave her gout , hctz and increase diuretics cause gou Procardia and amlodipine cause severe swelling  Lisinopril made her cough Clonidine makes her sleepy.  Currently taking labatolol 200mg  tid which makes her tired and she feels is not working  Currently denies HAs, CP, SOB, palpitations.   Past Medical History  Diagnosis Date  . Gout   . Hypertension   . Chronic kidney disease   . Hyperlipidemia   . Systolic CHF, chronic   . Gout   . Breast mass   . Complication of anesthesia     severe htn-had to take 2 ativan preop  . Anxiety   . Depression   . OSA (obstructive sleep apnea)     uses cpap--does not use   . Diabetes mellitus borderline    glucose intolerance   Past Surgical History  Procedure Laterality Date  . Abdominal hysterectomy  1999    partial  . Breast biopsy  01/01/2012    Procedure: BREAST BIOPSY WITH NEEDLE LOCALIZATION;  Surgeon: Haywood Lasso, MD;  Location: [facility];  Service: General;  Laterality: Right;  Needle localization excision Right breast mass   History   Social History  . Marital Status: Single    Spouse Name: N/A    Number of Children: 0  . Years of Education: N/A   Occupational  History  . Insurance agent    Social History Main Topics  . Smoking status: Never Smoker   . Smokeless tobacco: Never Used  . Alcohol Use: No  . Drug Use: No  . Sexually Active: None   Other Topics Concern  . None   Social History Narrative   Marital:  Divorced; dating.     Children: none     Lives in [place], [place] alone.      Employment:  [employer] [place].      Tobacco: none    Family History  Problem Relation Age of Onset  . Heart failure Sister   . Diabetes Sister   . Hypertension Sister   . Kidney disease Sister   . Hypertension Sister   . Diabetes Sister   . Hypertension Mother   . Hypertension Sister   . Diabetes type II Sister   . Cancer Father     lung cancer; smoker and asbestos exposure  . Diabetes Father   . Cancer Maternal Aunt     ovarian  . Cancer Maternal Grandmother     breast   Allergies  Allergen Reactions  . Lisinopril Cough  . Norvasc (Amlodipine Besylate) Swelling  . Procardia (Nifedipine)   . Sulfa Antibiotics Swelling  . Tekturna (Aliskiren) Swelling   Prior to Admission medications  Medication Sig Start Date End Date Taking? Authorizing Provider  cloNIDine (CATAPRES - DOSED IN MG/24 HR) 0.1 mg/24hr patch Place 1 patch (0.1 mg total) onto the skin once a week. 05/29/12  Yes Shawnee Knapp, MD  colchicine 0.6 MG tablet Take 1 tablet (0.6 mg total) by mouth daily. And increase dose as directed during gout flair 05/29/12 05/29/13 Yes Shawnee Knapp, MD  Febuxostat (ULORIC) 80 MG TABS Take 1 tablet (80 mg total) by mouth daily. 04/15/12  Yes Darlyne Russian, MD  furosemide (LASIX) 20 MG tablet Take 1 tablet (20 mg total) by mouth daily. 01/25/12  Yes Wardell Honour, MD  HYDROcodone-acetaminophen (NORCO) 5-325 MG per tablet Take 2 tablets by mouth every 4 (four) hours as needed for pain. 05/05/12  Yes Wandra Arthurs, MD  labetalol (NORMODYNE) 200 MG tablet Take 1 tablet (200 mg total) by mouth 3 (three) times daily. 05/29/12  Yes Shawnee Knapp, MD   LORazepam (ATIVAN) 1 MG tablet Take 1 tablet (1 mg total) by mouth 2 (two) times daily. 01/25/12  Yes Wardell Honour, MD  Multiple Vitamin (MULITIVITAMIN WITH MINERALS) TABS Take 1 tablet by mouth daily.   Yes Historical Provider, MD  olmesartan (BENICAR) 40 MG tablet Take 1 tablet (40 mg total) by mouth 2 (two) times daily. 04/08/12  Yes Ryan M Dunn, PA-C  predniSONE (DELTASONE) 20 MG tablet Take 2 tablets (40 mg total) by mouth daily. 05/29/12  Yes Shawnee Knapp, MD  sertraline (ZOLOFT) 50 MG tablet Take 50 mg by mouth daily.   Yes Historical Provider, MD     ROS: The patient denies fevers, chills, night sweats, unintentional weight loss, chest pain, palpitations, wheezing, dyspnea on exertion, nausea, vomiting, abdominal pain, dysuria, hematuria, melena, numbness, weakness, or tingling.   All other systems have been reviewed and were otherwise negative with the exception of those mentioned in the HPI and as above.    PHYSICAL EXAM: Filed Vitals:   07/02/12 1404  BP: 190/100  Pulse: 69  Temp: 98 F (36.7 C)  Resp: 16   Filed Vitals:   07/02/12 1404  Height: 5' 5.5" (1.664 m)  Weight: 238 lb (107.956 kg)   Body mass index is 38.99 kg/(m^2).  General: Alert, no acute distress, obese AA female HEENT:  Normocephalic, atraumatic, oropharynx patent. EOMI, PERRLA, fundoscpci exam grossly nl Cardiovascular:  Regular rate and rhythm, no rubs murmurs or gallops.  No Carotid bruits, radial pulse intact. No pedal edema.  Respiratory: Clear to auscultation bilaterally.  No wheezes, rales, or rhonchi.  No cyanosis, no use of accessory musculature GI: No organomegaly, abdomen is soft and non-tender, positive bowel sounds.  No masses. Skin: No rashes. Neurologic: Facial musculature symmetric. Psychiatric: Patient is appropriate throughout our interaction. Lymphatic: No cervical lymphadenopathy Musculoskeletal: Gait intact. Right breast scar, no lumps/bump/dc/skin changes bilateral breast, no  LAD GU-yellow-green dc, + odor.    LABS: Results for orders placed in visit on 07/02/12  POCT CBC      Result Value Range   WBC 10.3 (*) 4.6 - 10.2 K/uL   Lymph, poc 3.1  0.6 - 3.4   POC LYMPH PERCENT 29.9  10 - 50 %L   MID (cbc) 0.6  0 - 0.9   POC MID % 5.7  0 - 12 %M   POC Granulocyte 6.6  2 - 6.9   Granulocyte percent 64.4  37 - 80 %G   RBC 5.49 (*) 4.04 - 5.48 M/uL   Hemoglobin  14.7  12.2 - 16.2 g/dL   HCT, POC 46.6  37.7 - 47.9 %   MCV 84.9  80 - 97 fL   MCH, POC 26.8 (*) 27 - 31.2 pg   MCHC 31.5 (*) 31.8 - 35.4 g/dL   RDW, POC 14.8     Platelet Count, POC 219  142 - 424 K/uL   MPV 11.9  0 - 99.8 fL  IFOBT (OCCULT BLOOD)      Result Value Range   IFOBT Negative    POCT UA - MICROSCOPIC ONLY      Result Value Range   WBC, Ur, HPF, POC tntc     RBC, urine, microscopic 0-4     Bacteria, U Microscopic 4+     Mucus, UA large     Epithelial cells, urine per micros 1-8     Crystals, Ur, HPF, POC neg     Casts, Ur, LPF, POC neg     Yeast, UA pos    POCT URINALYSIS DIPSTICK      Result Value Range   Color, UA yellow     Clarity, UA cloudy     Glucose, UA neg     Bilirubin, UA neg     Ketones, UA neg     Spec Grav, UA 1.020     Blood, UA trace-lysed     pH, UA 5.5     Protein, UA 100     Urobilinogen, UA 0.2     Nitrite, UA pos     Leukocytes, UA small (1+)    POCT WET PREP WITH KOH      Result Value Range   Trichomonas, UA Negative     Clue Cells Wet Prep HPF POC 2-4     Epithelial Wet Prep HPF POC 3-18     Yeast Wet Prep HPF POC pos     Bacteria Wet Prep HPF POC 3+     RBC Wet Prep HPF POC 2-8     WBC Wet Prep HPF POC tntc     KOH Prep POC Positive       EKG/XRAY:   Primary read interpreted by Dr. Marin Comment at [facility].   ASSESSMENT/PLAN: Encounter Diagnoses  Name Primary?  . Routine general medical examination at a health care facility Yes  . Unspecified essential hypertension   . BV (bacterial vaginosis)   . Vaginal candidiasis    [name] is a  57yo AA female who presented here for an anuual exam but she really has a lot more medical problems then we can address today. She has a  PMHx significant for chronic systolic CHF, remote hx of PSVT (04/2009 in the setting of urosepsis), longstanding HTN ( mutliple HTN med allergies specifically gout on HCTZ and increase dose of Lasix) , hyperlipidemia, morbid obesity, OSA not currently on CPAP because mask makes her feel constricted, CKD (stage III) and hypothyroidism.   For her poorly controlled HTN- she as recently put cataprese and thi sis not working. She states that the best BB was Coreg and it controlled her BP much better than her labetalol.  I will dc her labetalol and rx Coreg since worked better for her. She is currently not having a gouty attack. Advise for her to return to Dr.Hochrein to readdress this.  She is currently noncompliant with her CPAP machine, I will refer her back to [facility] to get nasal pillars since mask makes her feel claustrophobic and she is noncompliant. We discussed weight and OSA  noncompliance as a contributing factor to her poorly controlled HTN. Annual Labs spending Rx Flagyl and Diflucan for BV and yeast No pap since has had cervix removed for fibroids She is UTD onher breastexam since had mammogram in Oct 13 and then surgery for a benign right breast mass She will see Dr. Percival Spanish or  return 1 week to see if BP ok, call if still >140/90 Monitor for worsening sxs, go to ER prn   Neymar Dowe, [place], DO 07/02/2012 11:09 PM   07/04/12 @ 223 LM regarding labs

## 2012-07-02 NOTE — Patient Instructions (Addendum)

## 2012-07-03 LAB — MICROALBUMIN / CREATININE URINE RATIO
Creatinine, Urine: 115.1 mg/dL
Microalb Creat Ratio: 361.9 mg/g — ABNORMAL HIGH (ref 0.0–30.0)
Microalb, Ur: 41.65 mg/dL — ABNORMAL HIGH (ref 0.00–1.89)

## 2012-07-03 LAB — TSH: TSH: 2.667 u[IU]/mL (ref 0.350–4.500)

## 2012-07-06 ENCOUNTER — Telehealth: Payer: Self-pay | Admitting: Family Medicine

## 2012-07-06 DIAGNOSIS — G4733 Obstructive sleep apnea (adult) (pediatric): Secondary | ICD-10-CM

## 2012-07-06 NOTE — Telephone Encounter (Signed)
Spoke to patient about labs, she has poorly controlled HTN and h/o gout on hctz and laso many meds. I am not sure if this is true but this what she says. We started her back on her coreg and dc her labetalol, her BP is 170/90. She thought that was still too high nad so she took a labetalol nad that made her BP go down and it was much better. I advise her that she should not do that, the corg and the labetatlol are both betablockers and cause her pulse to go down too low. She knows she has hyperlipidemia but the tatin causes her to have SEs and so advise her  To lose weight and take fish oil 3 caps daily. I will refer her to [facility] where she got ehr CPAP so she can get nasal prongs to use, this will help her with her HTN if she is complaint with CPAP

## 2012-07-09 ENCOUNTER — Ambulatory Visit (INDEPENDENT_AMBULATORY_CARE_PROVIDER_SITE_OTHER): Payer: BC Managed Care – PPO | Admitting: Family Medicine

## 2012-07-09 VITALS — BP 160/110 | HR 66 | Temp 98.0°F | Resp 16 | Ht 66.0 in | Wt 240.0 lb

## 2012-07-09 DIAGNOSIS — G4733 Obstructive sleep apnea (adult) (pediatric): Secondary | ICD-10-CM

## 2012-07-09 DIAGNOSIS — I10 Essential (primary) hypertension: Secondary | ICD-10-CM

## 2012-07-09 DIAGNOSIS — F411 Generalized anxiety disorder: Secondary | ICD-10-CM

## 2012-07-09 MED ORDER — SERTRALINE HCL 100 MG PO TABS: 100.0000 mg | ORAL_TABLET | Freq: Every day | ORAL | Status: DC

## 2012-07-09 MED ORDER — VALSARTAN 320 MG PO TABS: 320.0000 mg | ORAL_TABLET | Freq: Every day | ORAL | Status: DC

## 2012-07-09 NOTE — Progress Notes (Signed)
[facility]:  Office Visit  Chief Complaint:  Chief Complaint  Patient presents with  . Follow-up    BP and refill    HPI: Chelsea Roberts is a 57 y.o. female who complains of  Here for HTN re-check. She has poorly controlled HTN and labile HTN due to multiple HTN drug reactions and also stress related HTN.She has multiple drug allergies due to gout, HCTZ, high doses of Lasix cause her to have gouty flare up. She also has cough on ACEIShe was on hydralazine and that caused her to have SEs, she also gets edema with calcium channel blockers.  We had changed her meds to Lasix 20 mg daily to Lasix 20 mg every other day and also 40 mg every other day.  She was also changed back to Coreg and labetalol was discontinued. . She is on Benicar and that is ok.  She is on Cataprese patch She has HA but typical of BP being high, this is not the highest it has ever been. More stress at work. Was on Zoloft 100 mg  And was better on that does rather than 50 mg.   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 His makes her stress level high and BP high/  She also does not use her CPAP No CP, palpitations, SOB, vision changes  Past Medical History  Diagnosis Date  . Gout   . Hypertension   . Chronic kidney disease   . Hyperlipidemia   . Systolic CHF, chronic   . Gout   . Breast mass   . Complication of anesthesia     severe htn-had to take 2 ativan preop  . Anxiety   . Depression   . OSA (obstructive sleep apnea)     uses cpap--does not use   . Diabetes mellitus borderline    glucose intolerance   Past Surgical History  Procedure Laterality Date  . Abdominal hysterectomy  1999    partial  . Breast biopsy  01/01/2012    Procedure: BREAST BIOPSY WITH NEEDLE LOCALIZATION;  Surgeon: Haywood Lasso, MD;  Location: [facility];  Service: General;  Laterality: Right;   Needle localization excision Right breast mass   History   Social History  . Marital Status: Single    Spouse Name: N/A    Number of Children: 0  . Years of Education: N/A   Occupational History  . Insurance agent    Social History Main Topics  . Smoking status: Never Smoker   . Smokeless tobacco: Never Used  . Alcohol Use: No  . Drug Use: No  . Sexually Active: None   Other Topics Concern  . None   Social History Narrative   Marital:  Divorced; dating.     Children: none     Lives in [place], [place] alone.      Employment:  [employer] [place].      Tobacco: none    Family History  Problem Relation Age of Onset  . Heart failure Sister   . Diabetes Sister   . Hypertension Sister   . Kidney disease Sister   . Hypertension Sister   . Diabetes Sister   . Hypertension Mother   . Hypertension Sister   . Diabetes type II Sister   . Cancer Father     lung cancer; smoker and asbestos exposure  . Diabetes Father   .  Cancer Maternal Aunt     ovarian  . Cancer Maternal Grandmother     breast   Allergies  Allergen Reactions  . Lisinopril Cough  . Norvasc (Amlodipine Besylate) Swelling  . Procardia (Nifedipine)   . Sulfa Antibiotics Swelling  . Tekturna (Aliskiren) Swelling   Prior to Admission medications   Medication Sig Start Date End Date Taking? Authorizing Provider  carvedilol (COREG) 25 MG tablet Take 2 pills PO in the AM and then 1 pill PO in the PM 07/02/12  Yes Derrick Orris P Koi Yarbro, DO  cloNIDine (CATAPRES - DOSED IN MG/24 HR) 0.1 mg/24hr patch Place 1 patch (0.1 mg total) onto the skin once a week. 05/29/12  Yes Shawnee Knapp, MD  colchicine 0.6 MG tablet Take 1 tablet (0.6 mg total) by mouth daily. And increase dose as directed during gout flair 05/29/12 05/29/13 Yes Shawnee Knapp, MD  Febuxostat (ULORIC) 80 MG TABS Take 1 tablet (80 mg total) by mouth daily. 04/15/12  Yes Darlyne Russian, MD  furosemide (LASIX) 20 MG tablet Take 1 tablet (20 mg total) by mouth  daily. 01/25/12  Yes Wardell Honour, MD  LORazepam (ATIVAN) 1 MG tablet Take 1 tablet (1 mg total) by mouth 2 (two) times daily. 01/25/12  Yes Wardell Honour, MD  Multiple Vitamin (MULITIVITAMIN WITH MINERALS) TABS Take 1 tablet by mouth daily.   Yes Historical Provider, MD  olmesartan (BENICAR) 40 MG tablet Take 1 tablet (40 mg total) by mouth 2 (two) times daily. 04/08/12  Yes Ryan M Dunn, PA-C  sertraline (ZOLOFT) 50 MG tablet Take 50 mg by mouth daily.   Yes Historical Provider, MD  HYDROcodone-acetaminophen (NORCO) 5-325 MG per tablet Take 2 tablets by mouth every 4 (four) hours as needed for pain. 05/05/12   Wandra Arthurs, MD  predniSONE (DELTASONE) 20 MG tablet Take 2 tablets (40 mg total) by mouth daily. 05/29/12   Shawnee Knapp, MD     ROS: The patient denies fevers, chills, night sweats, unintentional weight loss, chest pain, palpitations, wheezing, dyspnea on exertion, nausea, vomiting, abdominal pain, dysuria, hematuria, melena, numbness, weakness, or tingling.  All other systems have been reviewed and were otherwise negative with the exception of those mentioned in the HPI and as above.    PHYSICAL EXAM: Filed Vitals:   07/09/12 1218  BP: 160/110  Pulse: 66  Temp: 98 F (36.7 C)  Resp: 16   Filed Vitals:   07/09/12 1218  Height: 5\' 6"  (1.676 m)  Weight: 240 lb (108.863 kg)   Body mass index is 38.76 kg/(m^2).  General: Alert, no acute distress HEENT:  Normocephalic, atraumatic, oropharynx patent.  Cardiovascular:  Regular rate and rhythm, no rubs murmurs or gallops.  No Carotid bruits, radial pulse intact. No pedal edema.  Respiratory: Clear to auscultation bilaterally.  No wheezes, rales, or rhonchi.  No cyanosis, no use of accessory musculature GI: No organomegaly, abdomen is soft and non-tender, positive bowel sounds.  No masses. Skin: No rashes. Neurologic: Facial musculature symmetric. Psychiatric: Patient is appropriate throughout our interaction. Lymphatic: No cervical  lymphadenopathy Musculoskeletal: Gait intact.   LABS: Results for orders placed in visit on 07/02/12  TSH      Result Value Range   TSH 2.667  0.350 - 4.500 uIU/mL  MICROALBUMIN / CREATININE URINE RATIO      Result Value Range   Microalb, Ur 41.65 (*) 0.00 - 1.89 mg/dL   Creatinine, Urine 115.1     Microalb Creat Ratio  361.9 (*) 0.0 - 30.0 mg/g  LDL CHOLESTEROL, DIRECT      Result Value Range   Direct LDL 191 (*)   POCT CBC      Result Value Range   WBC 10.3 (*) 4.6 - 10.2 K/uL   Lymph, poc 3.1  0.6 - 3.4   POC LYMPH PERCENT 29.9  10 - 50 %L   MID (cbc) 0.6  0 - 0.9   POC MID % 5.7  0 - 12 %M   POC Granulocyte 6.6  2 - 6.9   Granulocyte percent 64.4  37 - 80 %G   RBC 5.49 (*) 4.04 - 5.48 M/uL   Hemoglobin 14.7  12.2 - 16.2 g/dL   HCT, POC 46.6  37.7 - 47.9 %   MCV 84.9  80 - 97 fL   MCH, POC 26.8 (*) 27 - 31.2 pg   MCHC 31.5 (*) 31.8 - 35.4 g/dL   RDW, POC 14.8     Platelet Count, POC 219  142 - 424 K/uL   MPV 11.9  0 - 99.8 fL  IFOBT (OCCULT BLOOD)      Result Value Range   IFOBT Negative    POCT UA - MICROSCOPIC ONLY      Result Value Range   WBC, Ur, HPF, POC tntc     RBC, urine, microscopic 0-4     Bacteria, U Microscopic 4+     Mucus, UA large     Epithelial cells, urine per micros 1-8     Crystals, Ur, HPF, POC neg     Casts, Ur, LPF, POC neg     Yeast, UA pos    POCT URINALYSIS DIPSTICK      Result Value Range   Color, UA yellow     Clarity, UA cloudy     Glucose, UA neg     Bilirubin, UA neg     Ketones, UA neg     Spec Grav, UA 1.020     Blood, UA trace-lysed     pH, UA 5.5     Protein, UA 100     Urobilinogen, UA 0.2     Nitrite, UA pos     Leukocytes, UA small (1+)    POCT WET PREP WITH KOH      Result Value Range   Trichomonas, UA Negative     Clue Cells Wet Prep HPF POC 2-4     Epithelial Wet Prep HPF POC 3-18     Yeast Wet Prep HPF POC pos     Bacteria Wet Prep HPF POC 3+     RBC Wet Prep HPF POC 2-8     WBC Wet Prep HPF POC tntc      KOH Prep POC Positive       EKG/XRAY:   Primary read interpreted by Dr. Marin Comment at [facility].   ASSESSMENT/PLAN: Encounter Diagnosis  Name Primary?  . HTN (hypertension) Yes   HTN better but not well controlled Discontinue Benicar Rx Diovan 320 mg daily If not well controlled then will change cataprese patch to clonidine 0.1 mg-0.2 mg TID Sleep study referral Dr. Wonda Cerise F/u in 1 week in office, F/u in 3 days with BP readings by phone BP gola less than 150/90 Rx Zoloft 100 mg increaes from 50 mg due to work related stress Gross sideeffects, risk and benefits, and alternatives of medications d/w patient. Patient is aware that all medications have potential sideeffects and we are unable to predict every sideeffect or  drug-drug interaction that may occur.      Lane Eland, [place], DO 07/09/2012 1:02 PM

## 2012-07-11 ENCOUNTER — Other Ambulatory Visit: Payer: Self-pay | Admitting: Physician Assistant

## 2012-07-14 ENCOUNTER — Telehealth: Payer: Self-pay

## 2012-07-14 ENCOUNTER — Other Ambulatory Visit: Payer: Self-pay | Admitting: Family Medicine

## 2012-07-14 DIAGNOSIS — I10 Essential (primary) hypertension: Secondary | ICD-10-CM

## 2012-07-14 DIAGNOSIS — M109 Gout, unspecified: Secondary | ICD-10-CM

## 2012-07-14 MED ORDER — CLONIDINE HCL 0.1 MG PO TABS: 0.1000 mg | ORAL_TABLET | Freq: Two times a day (BID) | ORAL | Status: DC

## 2012-07-14 MED ORDER — PREDNISONE 20 MG PO TABS: ORAL_TABLET | ORAL | Status: DC

## 2012-07-14 NOTE — Progress Notes (Signed)
Spoke to patient about gout and BP. She will monitor her BP with new changes dc Lasix 40 mg every other day since gout has come on dc cataprese patch, take cataprese PO BID. Cont to monitor BP

## 2012-07-14 NOTE — Telephone Encounter (Signed)
Patient called in and states that she saw Dr. Marin Comment recently and was instructed to keep up with her blood pressure and pulse rate after having her medication changed.  She has gout in her hand.  She called back in readings from the new medication.  Friday, 6/20  9:55 PM  196/117  Pulse 68 Saturday, 6/21 9:34 AM 185/102 pulse 60 Saturday, 6/21 10:29 AM 157/95 pulse 64 Saturday, 6/21  2:37 PM 194/111 pulse 66 Saturday, 6/21  4:11 PM 165/104 pulse 69 Saturday, 6/21 7:30 PM 158/95  pulse 69 Saturday, 6/21 8:41 PM 143/81 pulse 69 Sunday, 6/22 9:01 AM 154/88 pulse 88 Sunday, 6/22 11:28 AM 158/89 pulse 63 Sunday, 6/22 2:59 PM 153/84 pulse 75 Sunday, 6/22 5:49 PM 186/101 pulse 69 Monday, 6/23 4:43 PM 191/114 pulse 67 Monday, 6/23 6:13 PM 151/88 pulse 74

## 2012-07-14 NOTE — Telephone Encounter (Signed)
I believe Dr. Marin Comment saw this patient most recently.  I haven't seen her in 8 months.

## 2012-07-22 ENCOUNTER — Other Ambulatory Visit: Payer: Self-pay | Admitting: Physician Assistant

## 2012-07-22 NOTE — Telephone Encounter (Signed)
Patient called wanting to give BP results for 07/17/12 7:43 am 197/110 pulse 69, 10:28 am 173/98 pulse 65, 11:42 184/104 pulse 67.   Patient had  Other dates to give & will when called back. [id number]  Patient also needs Benicar 80 mg called in [facility]

## 2012-07-23 NOTE — Telephone Encounter (Signed)
Dr Marin Comment, it looks like you DCd the pt's Benicar at last OV. Please advise.

## 2012-07-25 ENCOUNTER — Other Ambulatory Visit: Payer: Self-pay | Admitting: Physician Assistant

## 2012-07-25 ENCOUNTER — Telehealth: Payer: Self-pay

## 2012-07-25 NOTE — Telephone Encounter (Signed)
Patient was given diovan for blood pressure however she states that it has been giving her a pain in the back of head. Patient would like get put on a different medication. [facility] [address]. Best number: [phone]

## 2012-07-26 NOTE — Telephone Encounter (Signed)
I am concerned her BP is causing this, not her medications, her BP not well  Controlled, see recent readings/ called her she needs to come in. Advised her to come in today for H/A and BP

## 2012-07-29 ENCOUNTER — Ambulatory Visit (INDEPENDENT_AMBULATORY_CARE_PROVIDER_SITE_OTHER): Payer: BC Managed Care – PPO | Admitting: Physician Assistant

## 2012-07-29 VITALS — BP 180/122 | HR 66 | Temp 98.0°F | Resp 18 | Ht 67.0 in | Wt 233.0 lb

## 2012-07-29 DIAGNOSIS — G4733 Obstructive sleep apnea (adult) (pediatric): Secondary | ICD-10-CM

## 2012-07-29 DIAGNOSIS — F411 Generalized anxiety disorder: Secondary | ICD-10-CM

## 2012-07-29 DIAGNOSIS — I10 Essential (primary) hypertension: Secondary | ICD-10-CM

## 2012-07-29 MED ORDER — SERTRALINE HCL 100 MG PO TABS: 150.0000 mg | ORAL_TABLET | Freq: Every day | ORAL | Status: DC

## 2012-07-29 MED ORDER — CARVEDILOL 25 MG PO TABS: ORAL_TABLET | ORAL | Status: DC

## 2012-07-29 MED ORDER — CLONIDINE HCL 0.1 MG PO TABS: 0.2000 mg | ORAL_TABLET | Freq: Two times a day (BID) | ORAL | Status: DC

## 2012-07-29 MED ORDER — OLMESARTAN MEDOXOMIL 40 MG PO TABS: 40.0000 mg | ORAL_TABLET | Freq: Every day | ORAL | Status: DC

## 2012-07-29 NOTE — Progress Notes (Signed)
Subjective:    Patient ID: Chelsea Roberts, female    DOB: 11/19/1955, 57 y.o.   MRN: [id number]  HPI ANALYSSE BORUCKI is a 57 y.o. female presenting for blood pressure recheck.  She saw Dr. Marin Comment on 6/20 and was started on Diovan, d/c Benicar due to uncontrolled BP.  Since then, her blood pressures have been running even higher.  Highest, 221/130.  Lowest, 127/69.  Ranges mostly 160s/100s.  Able to point out times when it's high that she is stressed.  Stress from work and home. Pt feels like her BP was more controlled on the benicar.    She is complaining of occipital headaches and dry mouth since starting Diovan and she wants to go back to the Benicar.  HA is occipital which is unlike others she has had before - dull aching pain.  Occur approx 3 times per day.  Does not take anything for the pain, it spontaneously resolves.  Denies chest pain, shortness of breath.  Dr. Marin Comment placed a referral for her Sleep Apnea.  Has not heard from [facility] yet.  Does not wear her CPAP due to claustrophobia 2nd to the mask.  Had sleep study performed 5 years ago.      Not interested in seeing the Cardiologist - she is unsure what he will do that we cannot do. She has seen the nephrologist and has been diagnosed with kidney disease 2nd to HTN but has been worked-up and she does not have renal stenosis.  Past medical history, surgical history, medications, allergies, social history and family history have been reviewed.  Review of Systems As stated in HPI - otherwise negative.     Objective:   Physical Exam Filed Vitals:   07/29/12 1637  BP: 180/122  Pulse: 66  Temp: 98 F (36.7 C)  Resp: 18  Height: 5\' 7"  (1.702 m)  Weight: 233 lb (105.688 kg)  SpO2: 100%   General:  WDWN female in no acute distress. Cardiovascular: S1 and S2 distinct with no murmurs, rubs or gallops. Respiratory:  CTA with no adventitious sounds.     Assessment & Plan:  1. Unspecified essential hypertension Blood  pressure is uncontrolled and definitely related to her anxiety and sleep apnea not being under control.  Seems it was better controlled by Benicar based on her home readings and visit readings.  Since she is experiencing new onset of HA and dry mouth with Diovan, switching back to Benicar.  Patient states she was taking 80 mg, however recommended max dose is 40 mg.  Will proceed with 40 mg Benicar daily.  Increasing Clonidine dosage from 0.1 mg to 0.2 mg.  Recommend taking two pills at night and one pill in the morning for the first week to avoid SE of sleepiness.  After first week, switch to two pills bid.  She will continue to keep track of her BP and recheck in 1 month unless her BP is even higher than it has been over the last 2 weeks  Recommended Cardiology due to very complication blood pressure control.  Patient not interested.  Patient had sleep study 5 years ago.  Do not expect a change in her diagnosis.  Therefore move forward with APRI for change of equipment - pt to call for equipment change and if she need help from me she will let me know.    Prescribe: -continue current dose -  carvedilol (COREG) 25 MG tablet; Take 2 pills PO in the AM and then 1  pill PO in the PM  Dispense: 90 tablet; Refill: 0 -cloNIDine (CATAPRES) 0.1 MG tablet; Take 2 tablets (0.2 mg total) by mouth 2 (two) times daily.  Dispense: 120 tablet; Refill: 0 -will restart Benicar but not at the dose that she has been on due to recommendations - olmesartan (BENICAR) 40 MG tablet; Take 1 tablet (40 mg total) by mouth daily.  Dispense: 30 tablet; Refill: 0  2. Generalized anxiety disorder High blood pressure is definitely related to stress.  Patient feels her dosage increase from 50 mg to 100 mg was helpful.  Attempting to get better stress control (and hopefully BP control) increasing dose of Zoloft from 100 mg to 150 mg.    Prescribe:   - sertraline (ZOLOFT) 100 MG tablet; Take 1.5 tablets (150 mg total) by mouth daily.   Dispense: 45 tablet; Refill: 0

## 2012-07-30 NOTE — Progress Notes (Signed)
I was directly involved in the patient's care including the physical exam, diagnosis and treatment plan.

## 2012-08-04 ENCOUNTER — Telehealth: Payer: Self-pay

## 2012-08-04 ENCOUNTER — Institutional Professional Consult (permissible substitution): Payer: BC Managed Care – PPO | Admitting: Pulmonary Disease

## 2012-08-04 NOTE — Telephone Encounter (Signed)
Pt is calling to see if Chelsea Roberts had received her bp readings that she faxed over yesterday Call back number is [phone]

## 2012-08-06 ENCOUNTER — Telehealth: Payer: Self-pay

## 2012-08-06 NOTE — Telephone Encounter (Signed)
Pt wants amy to call her regarding taking Hydrocozide 10 mg 3 times a day.   [phone]

## 2012-08-06 NOTE — Telephone Encounter (Signed)
I got her BP readings and I think that overall they are better - it is still higher than I would like - what does of Clonidine is she currently taking?

## 2012-08-06 NOTE — Telephone Encounter (Signed)
Called her, she states she was taking 1 am 1 at lunch and 2 at night this is making her sleepy. Please advise.

## 2012-08-07 NOTE — Telephone Encounter (Signed)
Stay on this dose and lets see if she gets used to it and continue to monitor her BP.

## 2012-08-07 NOTE — Telephone Encounter (Signed)
Pt is concerned about the side effects. She has been tired frequently. She also states she has a dry mouth. She will continue to take the medication for the next week and monitor her BP. She will fax her numbers to [facility] again. She states she will call us back if the side effects become intolerable.

## 2012-08-07 NOTE — Telephone Encounter (Signed)
Lm for return call

## 2012-08-10 NOTE — Telephone Encounter (Signed)
Spoke with patient and she said she faxed over BP readings and wants Amy to call her.

## 2012-08-12 NOTE — Telephone Encounter (Signed)
Have you gotten her BP readings?

## 2012-08-13 ENCOUNTER — Ambulatory Visit (INDEPENDENT_AMBULATORY_CARE_PROVIDER_SITE_OTHER): Payer: BC Managed Care – PPO | Admitting: Emergency Medicine

## 2012-08-13 ENCOUNTER — Encounter: Payer: Self-pay | Admitting: Physician Assistant

## 2012-08-13 VITALS — BP 210/130 | HR 65 | Temp 98.0°F | Resp 16 | Wt 231.0 lb

## 2012-08-13 DIAGNOSIS — I10 Essential (primary) hypertension: Secondary | ICD-10-CM

## 2012-08-13 MED ORDER — HYDRALAZINE HCL 10 MG PO TABS: 10.0000 mg | ORAL_TABLET | Freq: Three times a day (TID) | ORAL | Status: DC

## 2012-08-13 NOTE — Telephone Encounter (Signed)
Lmom for patient to [facility]

## 2012-08-13 NOTE — Telephone Encounter (Signed)
Pt came into the office.

## 2012-08-13 NOTE — Progress Notes (Signed)
   [address], [place] [address]   Phone [phone]  Subjective:    Patient ID: Chelsea Roberts, female    DOB: 13-Oct-1955, 57 y.o.   MRN: [id number]  HPI  Pt presents to clinic for HTN recheck.  At her last visit we increased her Clonidine and since then she has been having terrible dry mouth and chapped lips and she just cannot tolerate taking the medication.  She had a very stressful day at work and she left to come here because she could not be there anymore.  She feels fine as she normally does.  She feels like the Zoloft has definitely helped with her anxiety.  She uses an Ativan qm and then another one at night if she needs it to sleep.  She also developed this swelling on her R foot 3 days ago without an injury and she has noticed it has improved but it is not resolved.    She last saw Dr Hassell Done 3-4 years ago. She ordered nasal pillows for her CPAP machine.  Review of Systems  Respiratory: Negative for shortness of breath.   Cardiovascular: Positive for leg swelling (same as normal for the patient). Negative for chest pain.       Objective:   Physical Exam  Vitals reviewed. Constitutional: She is oriented to person, place, and time. She appears well-developed and well-nourished.  HENT:  Head: Normocephalic and atraumatic.  Right Ear: External ear normal.  Left Ear: External ear normal.  Cardiovascular: Normal rate, regular rhythm and normal heart sounds.   No murmur heard. Pulmonary/Chest: Effort normal and breath sounds normal.  Musculoskeletal:       Feet:  Neurological: She is alert and oriented to person, place, and time.  Skin: Skin is warm and dry.  Psychiatric: She has a normal mood and affect. Her behavior is normal. Judgment and thought content normal.        Assessment & Plan:  HTN (hypertension) uncontrolled- Plan: hydrALAZINE (APRESOLINE) 10 MG tablet, Ambulatory referral to Nephrology  D/w pt at length the potential aggravations of her HTN  including her renal disease, uncontrolled sleep apnea and anxiety.  I think her BP is only slightly better on the increased dose of Clonidine and with her SE we will discontinue that and start Hydralazine.  She will start using her CPAP when she gets her supplies.  She will continue the Zoloft and we will plan to increase her dose in about 1 month.  For now she will increase her Ativan to 1 po tid to see if that makes a different in her BP.  She will make an appt with Dr Hassell Done for repeat kidney testing and to see if they have any other ideas to improve her BP control.  She understands and agrees with the plan.  D/w Dr Everlene Farrier.  Pt will continue to monitor her BP and fax me her results weekly with her indicating her increase dose of Ativan and addition of the hydralazine and when her dose increases occur.  Windell Hummingbird PA-C 08/13/2012 8:57 PM

## 2012-08-13 NOTE — Telephone Encounter (Signed)
Her BP is not controlled.  I am concerned about her kidney function and her continued elevated BP.  The only thing that I can do with her medications is to increase her Clonidine but I know that she feels like it makes her fatigued so we could try the patch and see if it helps the SE.  I really think that the patient needs to see a kidney or heart specialist to figure out what her kidney damage is and how that is affecting her BP.  Hopefully getting back using the CPAP will help with her BP readings.  We can also be more aggressive with her anxiety treatment - is she using the Ativan bid daily? - I know that she says work really causes her to be stressed but her BP is not really improved on the weekends so I am concerned that stress is not her sole problem.

## 2012-08-19 ENCOUNTER — Telehealth: Payer: Self-pay

## 2012-08-19 ENCOUNTER — Ambulatory Visit (INDEPENDENT_AMBULATORY_CARE_PROVIDER_SITE_OTHER): Payer: BC Managed Care – PPO | Admitting: Family Medicine

## 2012-08-19 VITALS — BP 160/102 | HR 68 | Temp 97.5°F | Resp 18 | Ht 67.0 in | Wt 233.0 lb

## 2012-08-19 DIAGNOSIS — I10 Essential (primary) hypertension: Secondary | ICD-10-CM

## 2012-08-19 DIAGNOSIS — N183 Chronic kidney disease, stage 3 unspecified: Secondary | ICD-10-CM

## 2012-08-19 DIAGNOSIS — G4733 Obstructive sleep apnea (adult) (pediatric): Secondary | ICD-10-CM

## 2012-08-19 MED ORDER — SPIRONOLACTONE 12.5 MG HALF TABLET: 12.5000 mg | ORAL_TABLET | Freq: Two times a day (BID) | ORAL | Status: DC

## 2012-08-19 NOTE — Telephone Encounter (Signed)
LMOM to RTC. 

## 2012-08-19 NOTE — Progress Notes (Signed)
[facility]:  Office Visit  Chief Complaint:  Chief Complaint  Patient presents with  . Gout    in both hands and right ankle     HPI: Chelsea Roberts is a 57 y.o. female who complains of  Here for HTN recheck. Current regiment is Coreg 50- mg in AM and 25 in PM She is also on Lasix 20 mg daily and also Benicar 40 mg Cardiology is Dr Audry Pili does not want to return to the practice.  Her nephrologist is Dr Hassell Done. She is supposed to make an appt with them but has not She presents with a complicated labile HTN history which we have tried to adjust with different meds but she has had SEs and/or reasons she does not want to take them. She was changed back to Benicar 40 mg instead of 80 mg due CKD ( which has been stable) She was also put on clonidine patch and also  0.1 mg TID which either did not help or  gave her dry mouth She has gout with HCTZ, Hydralazine, high doses of Lasix She has HA with Diovan She has chronic cough with ACEI  BP 147/86 and highest has been 224/110 since last visit Denies chest pain, shortness of breath.  I placed a referral for her Sleep Apnea but she decided that she does not want to go, she oredered nasal pillars and is using that instead and just recently strated, her CPAP mask makes her calustrophobic     Past Medical History  Diagnosis Date  . Gout   . Hypertension   . Chronic kidney disease   . Hyperlipidemia   . Systolic CHF, chronic   . Gout   . Breast mass   . Complication of anesthesia     severe htn-had to take 2 ativan preop  . Anxiety   . Depression   . OSA (obstructive sleep apnea)     uses cpap--does not use   . Diabetes mellitus borderline    glucose intolerance   Past Surgical History  Procedure Laterality Date  . Abdominal hysterectomy  1999    partial  . Breast biopsy  01/01/2012    Procedure: BREAST BIOPSY WITH NEEDLE LOCALIZATION;  Surgeon: Haywood Lasso, MD;  Location: [facility];  Service: General;  Laterality: Right;  Needle localization excision Right breast mass   History   Social History  . Marital Status: Single    Spouse Name: N/A    Number of Children: 0  . Years of Education: N/A   Occupational History  . Insurance agent    Social History Main Topics  . Smoking status: Never Smoker   . Smokeless tobacco: Never Used  . Alcohol Use: No  . Drug Use: No  . Sexually Active: None   Other Topics Concern  . None   Social History Narrative   Marital:  Divorced; dating.     Children: none     Lives in [place], [place] alone.      Employment:  [employer] [place].      Tobacco: none    Family History  Problem Relation Age of Onset  . Heart failure Sister   . Diabetes Sister   . Hypertension Sister   . Kidney disease Sister   . Hypertension Sister   . Diabetes Sister   . Hypertension Mother   . Hypertension Sister   . Diabetes type II Sister   . Cancer Father  lung cancer; smoker and asbestos exposure  . Diabetes Father   . Cancer Maternal Aunt     ovarian  . Cancer Maternal Grandmother     breast   Allergies  Allergen Reactions  . Clonidine Derivatives     Extreme dry mouth  . Diovan (Valsartan)     headache  . Hctz (Hydrochlorothiazide)     gout  . Hydralazine     gout  . Lisinopril Cough  . Norvasc (Amlodipine Besylate) Swelling  . Procardia (Nifedipine)   . Sulfa Antibiotics Swelling  . Tekturna (Aliskiren) Swelling   Prior to Admission medications   Medication Sig Start Date End Date Taking? Authorizing Provider  carvedilol (COREG) 25 MG tablet Take 2 pills PO in the AM and then 1 pill PO in the PM 07/29/12  Yes Mancel Bale, PA-C  colchicine 0.6 MG tablet Take 1 tablet (0.6 mg total) by mouth daily. And increase dose as directed during gout flair 05/29/12 05/29/13 Yes Shawnee Knapp, MD  Febuxostat (ULORIC) 80 MG TABS Take 1 tablet (80 mg total) by mouth daily. 04/15/12  Yes Darlyne Russian, MD  furosemide  (LASIX) 20 MG tablet Take 1 tablet (20 mg total) by mouth daily. 01/25/12  Yes Wardell Honour, MD  hydrALAZINE (APRESOLINE) 10 MG tablet Take 1 tablet (10 mg total) by mouth 3 (three) times daily. 08/13/12  Yes Mancel Bale, PA-C  HYDROcodone-acetaminophen (NORCO) 5-325 MG per tablet Take 2 tablets by mouth every 4 (four) hours as needed for pain. 05/05/12  Yes Wandra Arthurs, MD  LORazepam (ATIVAN) 1 MG tablet Take 1 tablet (1 mg total) by mouth 2 (two) times daily. 01/25/12  Yes Wardell Honour, MD  Multiple Vitamin (MULITIVITAMIN WITH MINERALS) TABS Take 1 tablet by mouth daily.   Yes Historical Provider, MD  olmesartan (BENICAR) 40 MG tablet Take 1 tablet (40 mg total) by mouth daily. 07/29/12  Yes Mancel Bale, PA-C  predniSONE (DELTASONE) 20 MG tablet Take 3 pills PO daily x 3 days, then 2 pills PO daily x 3 days, then 1 pill PO daily x 3 days. 07/14/12  Yes Kaithlyn Teagle P Kinneth Fujiwara, DO  sertraline (ZOLOFT) 100 MG tablet Take 1.5 tablets (150 mg total) by mouth daily. 07/29/12  Yes Sarah Alleen Borne, PA-C     ROS: The patient denies fevers, chills, night sweats, unintentional weight loss, chest pain, palpitations, wheezing, dyspnea on exertion, nausea, vomiting, abdominal pain, dysuria, hematuria, melena, numbness, weakness, or tingling.  All other systems have been reviewed and were otherwise negative with the exception of those mentioned in the HPI and as above.    PHYSICAL EXAM: Filed Vitals:   08/19/12 1639  BP: 160/102  Pulse: 68  Temp: 97.5 F (36.4 C)  Resp: 18   Filed Vitals:   08/19/12 1639  Height: 5\' 7"  (1.702 m)  Weight: 233 lb (105.688 kg)   Body mass index is 36.48 kg/(m^2).  General: Alert, no acute distress HEENT:  Normocephalic, atraumatic, oropharynx patent.  Cardiovascular:  Regular rate and rhythm, no rubs murmurs or gallops.  No Carotid bruits, radial pulse intact. No pedal edema.  Respiratory: Clear to auscultation bilaterally.  No wheezes, rales, or rhonchi.  No cyanosis, no use of  accessory musculature GI: No organomegaly, abdomen is soft and non-tender, positive bowel sounds.  No masses. Skin: No rashes. Neurologic: Facial musculature symmetric. Psychiatric: Patient is appropriate throughout our interaction. Lymphatic: No cervical lymphadenopathy Musculoskeletal: Gait intact.   LABS: Results for  orders placed in visit on 08/19/12  COMPREHENSIVE METABOLIC PANEL      Result Value Range   Sodium 139  135 - 145 mEq/L   Potassium 3.7  3.5 - 5.3 mEq/L   Chloride 106  96 - 112 mEq/L   CO2 20  19 - 32 mEq/L   Glucose, Bld 98  70 - 99 mg/dL   BUN 28 (*) 6 - 23 mg/dL   Creat 1.57 (*) 0.50 - 1.10 mg/dL   Total Bilirubin 0.6  0.3 - 1.2 mg/dL   Alkaline Phosphatase 88  39 - 117 U/L   AST 16  0 - 37 U/L   ALT 14  0 - 35 U/L   Total Protein 6.9  6.0 - 8.3 g/dL   Albumin 4.1  3.5 - 5.2 g/dL   Calcium 9.2  8.4 - 10.5 mg/dL     EKG/XRAY:   Primary read interpreted by Dr. Marin Comment at [facility].   ASSESSMENT/PLAN: Encounter Diagnoses  Name Primary?  . HTN (hypertension) Yes  . Obstructive sleep apnea   . CKD (chronic kidney disease) stage 3, GFR 30-59 ml/min    Labile HTN with multipfactorial reasons including multiple  SEs from meds rx, noncompliance for CPAP, stress induced, and lack of low salt diet and exercise.  We have added spironolactone 12.5 mg BID CMP creatinine is trending down, she will see Dr. Hassell Done for her CKD 3 She will return in 2 weeks to get her BP rechecked.  In the meantime I have referred her to see Dr. Berlinda Last cardiology F/u prn   Adrijana Haros, Enoch, DO 08/22/2012 10:41 AM

## 2012-08-19 NOTE — Telephone Encounter (Signed)
Pt states that the hydrALAZINE has cause her to have gout in her hand, pt would like to know what she should do? Best# [phone]

## 2012-08-20 LAB — COMPREHENSIVE METABOLIC PANEL
AST: 16 U/L (ref 0–37)
Albumin: 4.1 g/dL (ref 3.5–5.2)
BUN: 28 mg/dL — ABNORMAL HIGH (ref 6–23)
Calcium: 9.2 mg/dL (ref 8.4–10.5)
Chloride: 106 mEq/L (ref 96–112)
Creat: 1.57 mg/dL — ABNORMAL HIGH (ref 0.50–1.10)
Glucose, Bld: 98 mg/dL (ref 70–99)
Potassium: 3.7 mEq/L (ref 3.5–5.3)

## 2012-08-20 LAB — COMPREHENSIVE METABOLIC PANEL WITH GFR
ALT: 14 U/L (ref 0–35)
Alkaline Phosphatase: 88 U/L (ref 39–117)
CO2: 20 meq/L (ref 19–32)
Sodium: 139 meq/L (ref 135–145)
Total Bilirubin: 0.6 mg/dL (ref 0.3–1.2)
Total Protein: 6.9 g/dL (ref 6.0–8.3)

## 2012-08-26 ENCOUNTER — Other Ambulatory Visit: Payer: Self-pay | Admitting: Physician Assistant

## 2012-08-28 ENCOUNTER — Telehealth: Payer: Self-pay | Admitting: Family Medicine

## 2012-08-28 NOTE — Telephone Encounter (Signed)
Spoke with pt re labs, doing better. No SEs with Spironolactone so far. BP 150s/80s.

## 2012-09-15 ENCOUNTER — Other Ambulatory Visit: Payer: Self-pay | Admitting: Physician Assistant

## 2012-09-16 ENCOUNTER — Other Ambulatory Visit: Payer: Self-pay | Admitting: Physician Assistant

## 2012-09-24 ENCOUNTER — Ambulatory Visit (INDEPENDENT_AMBULATORY_CARE_PROVIDER_SITE_OTHER): Payer: BC Managed Care – PPO | Admitting: Physician Assistant

## 2012-09-24 VITALS — BP 150/96 | HR 67 | Temp 98.0°F | Resp 16 | Ht 66.0 in | Wt 229.0 lb

## 2012-09-24 DIAGNOSIS — Z0271 Encounter for disability determination: Secondary | ICD-10-CM

## 2012-09-24 DIAGNOSIS — M109 Gout, unspecified: Secondary | ICD-10-CM

## 2012-09-24 DIAGNOSIS — M79609 Pain in unspecified limb: Secondary | ICD-10-CM

## 2012-09-24 DIAGNOSIS — Z23 Encounter for immunization: Secondary | ICD-10-CM

## 2012-09-24 MED ORDER — HYDROCODONE-ACETAMINOPHEN 5-325 MG PO TABS: 1.0000 | ORAL_TABLET | Freq: Four times a day (QID) | ORAL | Status: DC | PRN

## 2012-09-24 MED ORDER — TRAMADOL HCL 50 MG PO TABS: 50.0000 mg | ORAL_TABLET | Freq: Three times a day (TID) | ORAL | Status: DC | PRN

## 2012-09-24 NOTE — Progress Notes (Signed)
  Subjective:    Patient ID: Chelsea Roberts, female    DOB: [birth year], 57 y.o.   MRN: [id number]  HPI This 57 y.o. female presents for evaluation of gout attack in the LEFT wrist.  She is RIGHT hand dominant.  Awoke this morning with LEFT wrist pain.  No fever, chills, redness or swelling.  No specific trauma or injury. No better with colchicine today.  Last gout attack was about 1 month ago. She uses Uloric 80 mg (did not tolerate Allopurinol, and notes "bumps" on her fingers with the increase of Uloric from 40 mg to 80 mg).  Her rheumatologist offered to increase it to 120 mg, but she declined.  She takes Lasix for HTN, and due to CKD, avoids NSAIDS, though she uses colchicine prn.  Medications, allergies, past medical history, surgical history, family history, social history and problem list reviewed.   Review of Systems As above.    Objective:   Physical Exam  Vitals reviewed. Constitutional: She appears well-developed and well-nourished. She is active and cooperative. No distress.  Eyes: Conjunctivae are normal.  Cardiovascular: Normal rate.   Pulmonary/Chest: Effort normal.  Musculoskeletal:       Left wrist: She exhibits decreased range of motion, tenderness and bony tenderness. She exhibits no swelling, no effusion, no crepitus, no deformity and no laceration.       Left hand: Normal. Normal sensation noted. Normal strength noted.  Neurological: She is alert.          Assessment & Plan:  Gout - Plan: HYDROcodone-acetaminophen (NORCO) 5-325 MG per tablet, traMADol (ULTRAM) 50 MG tablet; follow-up with PCP or Rheumatologist if pain persists.  Need for influenza vaccination - Plan: Flu Vaccine QUAD 36+ mos IM   Fara Chute, PA-C Physician Assistant-Certified [facility]

## 2012-09-24 NOTE — Patient Instructions (Signed)
Follow-up with your PCP or rheumatologist if your wrist pain persists.

## 2012-09-28 ENCOUNTER — Telehealth: Payer: Self-pay

## 2012-09-28 NOTE — Telephone Encounter (Signed)
FMLA paperwork in Chelle's box for completion.

## 2012-10-04 ENCOUNTER — Telehealth: Payer: Self-pay

## 2012-10-04 NOTE — Telephone Encounter (Signed)
Pt dropped off FMLA paperwork on September 5th. Calling to see if they have been completed. Wanted them faxed. Call at [phone].

## 2012-12-10 ENCOUNTER — Encounter: Payer: Self-pay | Admitting: Physician Assistant

## 2013-01-29 ENCOUNTER — Other Ambulatory Visit: Payer: Self-pay | Admitting: Physician Assistant

## 2013-01-31 NOTE — Telephone Encounter (Signed)
Needs office visit.

## 2013-02-01 ENCOUNTER — Other Ambulatory Visit: Payer: Self-pay | Admitting: Physician Assistant

## 2013-06-30 ENCOUNTER — Emergency Department (HOSPITAL_COMMUNITY)
Admission: EM | Admit: 2013-06-30 | Discharge: 2013-06-30 | Disposition: A | Discharge status: Home / Self Care | Payer: BC Managed Care – PPO | Attending: Emergency Medicine | Admitting: Emergency Medicine

## 2013-06-30 ENCOUNTER — Encounter (HOSPITAL_COMMUNITY): Payer: Self-pay | Admitting: Emergency Medicine

## 2013-06-30 DIAGNOSIS — F3289 Other specified depressive episodes: Secondary | ICD-10-CM | POA: Insufficient documentation

## 2013-06-30 DIAGNOSIS — N189 Chronic kidney disease, unspecified: Secondary | ICD-10-CM | POA: Insufficient documentation

## 2013-06-30 DIAGNOSIS — M25569 Pain in unspecified knee: Secondary | ICD-10-CM

## 2013-06-30 DIAGNOSIS — M109 Gout, unspecified: Secondary | ICD-10-CM

## 2013-06-30 DIAGNOSIS — Z79899 Other long term (current) drug therapy: Secondary | ICD-10-CM | POA: Insufficient documentation

## 2013-06-30 DIAGNOSIS — J3489 Other specified disorders of nose and nasal sinuses: Secondary | ICD-10-CM | POA: Insufficient documentation

## 2013-06-30 DIAGNOSIS — F411 Generalized anxiety disorder: Secondary | ICD-10-CM | POA: Insufficient documentation

## 2013-06-30 DIAGNOSIS — Z8742 Personal history of other diseases of the female genital tract: Secondary | ICD-10-CM | POA: Insufficient documentation

## 2013-06-30 DIAGNOSIS — I129 Hypertensive chronic kidney disease with stage 1 through stage 4 chronic kidney disease, or unspecified chronic kidney disease: Secondary | ICD-10-CM | POA: Insufficient documentation

## 2013-06-30 DIAGNOSIS — F329 Major depressive disorder, single episode, unspecified: Secondary | ICD-10-CM | POA: Insufficient documentation

## 2013-06-30 DIAGNOSIS — I5022 Chronic systolic (congestive) heart failure: Secondary | ICD-10-CM | POA: Insufficient documentation

## 2013-06-30 MED ORDER — PREDNISONE 20 MG PO TABS: 60.0000 mg | ORAL_TABLET | Freq: Every day | ORAL | Status: DC

## 2013-06-30 MED ORDER — COLCHICINE 0.6 MG PO TABS: 0.6000 mg | ORAL_TABLET | Freq: Every day | ORAL | Status: DC

## 2013-06-30 MED ORDER — OXYCODONE-ACETAMINOPHEN 5-325 MG PO TABS
1.0000 | ORAL_TABLET | Freq: Once | ORAL | Status: AC
  Administered 2013-06-30: 1 via ORAL
  Filled 2013-06-30: qty 1

## 2013-06-30 MED ORDER — OXYCODONE-ACETAMINOPHEN 5-325 MG PO TABS: 1.0000 | ORAL_TABLET | Freq: Four times a day (QID) | ORAL | Status: DC | PRN

## 2013-06-30 MED ORDER — PREDNISONE 20 MG PO TABS
60.0000 mg | ORAL_TABLET | Freq: Once | ORAL | Status: AC
  Administered 2013-06-30: 60 mg via ORAL
  Filled 2013-06-30: qty 3

## 2013-06-30 NOTE — ED Notes (Signed)
Pt A&XO4, wheeled out of ED into waiting room via wheelchair,NAD.

## 2013-06-30 NOTE — Discharge Instructions (Signed)
Take the prednisone 60mg  daily for four more days, starting tomorrow morning. Use percocet as needed. Follow up with your rheumatologist if your symptoms are not improving.  Seek medical care if you have a fever, your pain is not improved, or you have any other new concerns.   Gout Gout is an inflammatory arthritis caused by a buildup of uric acid crystals in the joints. Uric acid is a chemical that is normally present in the blood. When the level of uric acid in the blood is too high it can form crystals that deposit in your joints and tissues. This causes joint redness, soreness, and swelling (inflammation). Repeat attacks are common. Over time, uric acid crystals can form into masses (tophi) near a joint, destroying bone and causing disfigurement. Gout is treatable and often preventable. CAUSES  The disease begins with elevated levels of uric acid in the blood. Uric acid is produced by your body when it breaks down a naturally found substance called purines. Certain foods you eat, such as meats and fish, contain high amounts of purines. Causes of an elevated uric acid level include:  Being passed down from parent to child (heredity).  Diseases that cause increased uric acid production (such as obesity, psoriasis, and certain cancers).  Excessive alcohol use.  Diet, especially diets rich in meat and seafood.  Medicines, including certain cancer-fighting medicines (chemotherapy), water pills (diuretics), and aspirin.  Chronic kidney disease. The kidneys are no longer able to remove uric acid well.  Problems with metabolism. Conditions strongly associated with gout include:  Obesity.  High blood pressure.  High cholesterol.  Diabetes. Not everyone with elevated uric acid levels gets gout. It is not understood why some people get gout and others do not. Surgery, joint injury, and eating too much of certain foods are some of the factors that can lead to gout attacks. SYMPTOMS   An  attack of gout comes on quickly. It causes intense pain with redness, swelling, and warmth in a joint.  Fever can occur.  Often, only one joint is involved. Certain joints are more commonly involved:  Base of the big toe.  Knee.  Ankle.  Wrist.  Finger. Without treatment, an attack usually goes away in a few days to weeks. Between attacks, you usually will not have symptoms, which is different from many other forms of arthritis. DIAGNOSIS  Your caregiver will suspect gout based on your symptoms and exam. In some cases, tests may be recommended. The tests may include:  Blood tests.  Urine tests.  X-rays.  Joint fluid exam. This exam requires a needle to remove fluid from the joint (arthrocentesis). Using a microscope, gout is confirmed when uric acid crystals are seen in the joint fluid. TREATMENT  There are two phases to gout treatment: treating the sudden onset (acute) attack and preventing attacks (prophylaxis).  Treatment of an Acute Attack.  Medicines are used. These include anti-inflammatory medicines or steroid medicines.  An injection of steroid medicine into the affected joint is sometimes necessary.  The painful joint is rested. Movement can worsen the arthritis.  You may use warm or cold treatments on painful joints, depending which works best for you.  Treatment to Prevent Attacks.  If you suffer from frequent gout attacks, your caregiver may advise preventive medicine. These medicines are started after the acute attack subsides. These medicines either help your kidneys eliminate uric acid from your body or decrease your uric acid production. You may need to stay on these medicines for a  very long time.  The early phase of treatment with preventive medicine can be associated with an increase in acute gout attacks. For this reason, during the first few months of treatment, your caregiver may also advise you to take medicines usually used for acute gout treatment.  Be sure you understand your caregiver's directions. Your caregiver may make several adjustments to your medicine dose before these medicines are effective.  Discuss dietary treatment with your caregiver or dietitian. Alcohol and drinks high in sugar and fructose and foods such as meat, poultry, and seafood can increase uric acid levels. Your caregiver or dietician can advise you on drinks and foods that should be limited. HOME CARE INSTRUCTIONS   Do not take aspirin to relieve pain. This raises uric acid levels.  Only take over-the-counter or prescription medicines for pain, discomfort, or fever as directed by your caregiver.  Rest the joint as much as possible. When in bed, keep sheets and blankets off painful areas.  Keep the affected joint raised (elevated).  Apply warm or cold treatments to painful joints. Use of warm or cold treatments depends on which works best for you.  Use crutches if the painful joint is in your leg.  Drink enough fluids to keep your urine clear or pale yellow. This helps your body get rid of uric acid. Limit alcohol, sugary drinks, and fructose drinks.  Follow your dietary instructions. Pay careful attention to the amount of protein you eat. Your daily diet should emphasize fruits, vegetables, whole grains, and fat-free or low-fat milk products. Discuss the use of coffee, vitamin C, and cherries with your caregiver or dietician. These may be helpful in lowering uric acid levels.  Maintain a healthy body weight. SEEK MEDICAL CARE IF:   You develop diarrhea, vomiting, or any side effects from medicines.  You do not feel better in 24 hours, or you are getting worse. SEEK IMMEDIATE MEDICAL CARE IF:   Your joint becomes suddenly more tender, and you have chills or a fever. MAKE SURE YOU:   Understand these instructions.  Will watch your condition.  Will get help right away if you are not doing well or get worse. Document Released: 01/04/2000 Document  Revised: 05/03/2012 Document Reviewed: 08/20/2011 [facility] Patient Information 2014 [place].

## 2013-06-30 NOTE — ED Provider Notes (Signed)
I saw and evaluated the patient, reviewed the resident's note and I agree with the findings and plan.   EKG Interpretation None      Well appearing. Doubt septic arthritis. Limited ROM. Doubt trauma. Gout. tx with oral steroids, hydrocodone. Pt will call her rheumatologist for follow up.   Hoy Morn, MD 06/30/13 [phone]

## 2013-06-30 NOTE — ED Notes (Signed)
PT from home, c/o right knee pain. Pt states hx of gout. Pain started tues. Denies injury.no deformities noted

## 2013-06-30 NOTE — ED Provider Notes (Signed)
CSN: [id number]     Arrival date & time 06/30/13  [id number] History   First MD Initiated Contact with Patient 06/30/13 [phone]     Chief Complaint  Patient presents with  . Knee Pain  . Gout     (Consider location/radiation/quality/duration/timing/severity/associated sxs/prior Treatment) Patient is a 58 y.o. female presenting with knee pain.  Knee Pain Associated symptoms: no fever    Patient is a 58 year old female with a history of gout, hypertension, CKD, mild CHF who presents with 2 days of right knee pain. She states his pain is consistent with her prior episodes of gout attacks. She takes uloric daily as well as colchicinedaily at home. She has been increasing her colchicine and last took it yesterday. She's been taking ibuprofen with mild improvement. She does have the prescription for hydrocodone at home, which she has not taken because she's had to work. 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 Her last flare was 2 months ago. She denies any fevers. Has had difficulty walking, and has been mildly short of breath with ambulation because of the difficulty walking from her knee. She is eating and drinking well, denies chest pain, denies swelling. She states that normally when she gets her rheumatologist is a gout flare, she gets a cortisone shot injected into her knee, which helps the pain. She denies any recent hx of injury or trauma. Has not fallen.  Past Medical History  Diagnosis Date  . Gout   . Hypertension   . Chronic kidney disease   . Hyperlipidemia   . Systolic CHF, chronic   . Gout   . Breast mass   . Complication of anesthesia     severe htn-had to take 2 ativan preop  . Anxiety   . Depression   . OSA (obstructive sleep apnea)     uses cpap--does not use   . Diabetes mellitus borderline    glucose intolerance   Past Surgical History  Procedure Laterality Date  . Abdominal hysterectomy  1999    partial  . Breast biopsy  01/01/2012    Procedure: BREAST BIOPSY WITH NEEDLE  LOCALIZATION;  Surgeon: Haywood Lasso, MD;  Location: [facility];  Service: General;  Laterality: Right;  Needle localization excision Right breast mass   Family History  Problem Relation Age of Onset  . Heart failure Sister   . Diabetes Sister   . Hypertension Sister   . Kidney disease Sister   . Hypertension Sister   . Diabetes Sister   . Hypertension Mother   . Hypertension Sister   . Diabetes type II Sister   . Cancer Father     lung cancer; smoker and asbestos exposure  . Diabetes Father   . Cancer Maternal Aunt     ovarian  . Cancer Maternal Grandmother     breast   History  Substance Use Topics  . Smoking status: Never Smoker   . Smokeless tobacco: Never Used  . Alcohol Use: No   OB History   Grav Para Term Preterm Abortions TAB SAB Ect Mult Living                 Review of Systems  Constitutional: Negative for fever.  HENT: Positive for congestion.   Cardiovascular: Negative for chest pain.  Musculoskeletal: Positive for arthralgias.  All other systems reviewed and are negative.     Allergies  Clonidine derivatives; Diovan; Hctz; Hydralazine; Lisinopril; Norvasc; Procardia; Sulfa antibiotics; and Tekturna  Home Medications   Prior to  Admission medications   Medication Sig Start Date End Date Taking? Authorizing Provider  amLODipine-olmesartan (AZOR) 10-40 MG per tablet Take 1 tablet by mouth daily.   Yes Historical Provider, MD  carvedilol (COREG) 25 MG tablet Take 25 mg by mouth 3 (three) times daily.   Yes Historical Provider, MD  colchicine 0.6 MG tablet Take 0.6 mg by mouth daily.   Yes Historical Provider, MD  Febuxostat (ULORIC) 80 MG TABS Take 1 tablet (80 mg total) by mouth daily. 04/15/12  Yes Darlyne Russian, MD  hydrALAZINE (APRESOLINE) 10 MG tablet Take 10 mg by mouth 3 (three) times daily.   Yes Historical Provider, MD  LORazepam (ATIVAN) 1 MG tablet Take 1 tablet (1 mg total) by mouth 2 (two) times daily. 01/25/12  Yes Wardell Honour, MD  Multiple Vitamin (MULITIVITAMIN WITH MINERALS) TABS Take 1 tablet by mouth daily.   Yes Historical Provider, MD  sertraline (ZOLOFT) 100 MG tablet Take 1.5 tablets (150 mg total) by mouth daily. PATIENT NEEDS OFFICE VISIT FOR ADDITIONAL REFILLS 02/01/13  Yes Eleanore E Egan, PA-C   BP 173/86  Pulse 73  Temp(Src) 98.4 F (36.9 C) (Oral)  Resp 16  Ht 5\' 5"  (1.651 m)  Wt 220 lb (99.791 kg)  BMI 36.61 kg/m2  SpO2 99% Physical Exam  Constitutional: She is oriented to person, place, and time. She appears well-developed and well-nourished. No distress.  HENT:  Head: Normocephalic and atraumatic.  Mouth/Throat: Oropharynx is clear and moist.  Eyes: EOM are normal. Pupils are equal, round, and reactive to light.  Cardiovascular: Normal rate, regular rhythm and normal heart sounds.   Pulses:      Dorsalis pedis pulses are 2+ on the right side, and 2+ on the left side.  Pulmonary/Chest: Effort normal and breath sounds normal.  Abdominal: Soft. Bowel sounds are normal.  Musculoskeletal:       Right knee: She exhibits decreased range of motion. She exhibits no swelling, no effusion, normal alignment and normal patellar mobility. No tenderness found.  Significant pain with any degree of flexion of R knee. LCL and MCL intact. Negative lachmans.  Neurological: She is alert and oriented to person, place, and time.  Skin: Skin is warm and dry. No rash noted. She is not diaphoretic.  Psychiatric: She has a normal mood and affect. Her behavior is normal.    ED Course  Procedures (including critical care time) Labs Review Labs Reviewed - No data to display  Imaging Review No results found.   EKG Interpretation None      MDM   Final diagnoses:  Gout flare  Knee pain    58 yo F with consistent with prior episodes of gout flares. No concern for septic joint at this time. Will patient will be started on 5 days of oral prednisone, and will be given a prescription for Percocet  for pain relief. She's been instructed to followup with her rheumatologist.  Chrisandra Netters, MD Family Medicine PGY-2   Leeanne Rio, MD 06/30/13 [phone]

## 2013-11-04 ENCOUNTER — Other Ambulatory Visit: Payer: Self-pay

## 2014-01-23 ENCOUNTER — Emergency Department (HOSPITAL_COMMUNITY)
Admission: EM | Admit: 2014-01-23 | Discharge: 2014-01-23 | Disposition: A | Discharge status: Home / Self Care | Payer: BLUE CROSS/BLUE SHIELD | Source: Home / Self Care | Attending: Emergency Medicine | Admitting: Emergency Medicine

## 2014-01-23 ENCOUNTER — Encounter (HOSPITAL_COMMUNITY): Payer: Self-pay | Admitting: *Deleted

## 2014-01-23 DIAGNOSIS — J111 Influenza due to unidentified influenza virus with other respiratory manifestations: Secondary | ICD-10-CM

## 2014-01-23 MED ORDER — OSELTAMIVIR PHOSPHATE 75 MG PO CAPS: 75.0000 mg | ORAL_CAPSULE | Freq: Two times a day (BID) | ORAL | Status: DC

## 2014-01-23 MED ORDER — HYDROCODONE-HOMATROPINE 5-1.5 MG/5ML PO SYRP: 5.0000 mL | ORAL_SOLUTION | Freq: Four times a day (QID) | ORAL | Status: DC | PRN

## 2014-01-23 NOTE — ED Provider Notes (Signed)
CSN: [id number]     Arrival date & time 01/23/14  1014 History   First MD Initiated Contact with Patient 01/23/14 1058     Chief Complaint  Patient presents with  . URI   (Consider location/radiation/quality/duration/timing/severity/associated sxs/prior Treatment) HPI She is a 59 year old woman here for evaluation of cough. Her symptoms started 4 days ago with sore throat and cough. She also reports fevers, hoarseness, bilateral ear pain. She had 2 episodes of diarrhea yesterday. No nausea or vomiting. Her cough is nonproductive. She did get a flu shot this year. She has been taking Mucinex with some improvement.  Past Medical History  Diagnosis Date  . Gout   . Hypertension   . Chronic kidney disease   . Hyperlipidemia   . Systolic CHF, chronic   . Gout   . Breast mass   . Complication of anesthesia     severe htn-had to take 2 ativan preop  . Anxiety   . Depression   . OSA (obstructive sleep apnea)     uses cpap--does not use   . Diabetes mellitus borderline    glucose intolerance   Past Surgical History  Procedure Laterality Date  . Abdominal hysterectomy  1999    partial  . Breast biopsy  01/01/2012    Procedure: BREAST BIOPSY WITH NEEDLE LOCALIZATION;  Surgeon: Haywood Lasso, MD;  Location: [facility];  Service: General;  Laterality: Right;  Needle localization excision Right breast mass   Family History  Problem Relation Age of Onset  . Heart failure Sister   . Diabetes Sister   . Hypertension Sister   . Kidney disease Sister   . Hypertension Sister   . Diabetes Sister   . Hypertension Mother   . Hypertension Sister   . Diabetes type II Sister   . Cancer Father     lung cancer; smoker and asbestos exposure  . Diabetes Father   . Cancer Maternal Aunt     ovarian  . Cancer Maternal Grandmother     breast   History  Substance Use Topics  . Smoking status: Never Smoker   . Smokeless tobacco: Never Used  . Alcohol Use: No   OB History     No data available     Review of Systems  Constitutional: Positive for fever and appetite change. Negative for chills.  HENT: Positive for ear pain, sore throat and voice change. Negative for congestion, rhinorrhea, sinus pressure and trouble swallowing.   Respiratory: Positive for cough. Negative for shortness of breath.   Cardiovascular: Negative for chest pain.  Gastrointestinal: Positive for diarrhea. Negative for nausea and vomiting.  Musculoskeletal: Positive for myalgias.  Neurological: Negative for dizziness and headaches.    Allergies  Clonidine derivatives; Diovan; Hctz; Hydralazine; Lisinopril; Norvasc; Procardia; Sulfa antibiotics; and Tekturna  Home Medications   Prior to Admission medications   Medication Sig Start Date End Date Taking? Authorizing Provider  amLODipine-olmesartan (AZOR) 10-40 MG per tablet Take 1 tablet by mouth daily.    Historical Provider, MD  carvedilol (COREG) 25 MG tablet Take 25 mg by mouth 3 (three) times daily.    Historical Provider, MD  colchicine 0.6 MG tablet Take 1 tablet (0.6 mg total) by mouth daily. 06/30/13   Leeanne Rio, MD  Febuxostat (ULORIC) 80 MG TABS Take 1 tablet (80 mg total) by mouth daily. 04/15/12   Darlyne Russian, MD  hydrALAZINE (APRESOLINE) 10 MG tablet Take 10 mg by mouth 3 (three) times daily.  Historical Provider, MD  HYDROcodone-homatropine (HYCODAN) 5-1.5 MG/5ML syrup Take 5 mLs by mouth every 6 (six) hours as needed for cough. 01/23/14   Melony Overly, MD  LORazepam (ATIVAN) 1 MG tablet Take 1 tablet (1 mg total) by mouth 2 (two) times daily. 01/25/12   Wardell Honour, MD  Multiple Vitamin (MULITIVITAMIN WITH MINERALS) TABS Take 1 tablet by mouth daily.    Historical Provider, MD  oseltamivir (TAMIFLU) 75 MG capsule Take 1 capsule (75 mg total) by mouth every 12 (twelve) hours. 01/23/14   Melony Overly, MD  oxyCODONE-acetaminophen (PERCOCET/ROXICET) 5-325 MG per tablet Take 1 tablet by mouth every 6 (six) hours as  needed for severe pain. 06/30/13   Leeanne Rio, MD  predniSONE (DELTASONE) 20 MG tablet Take 3 tablets (60 mg total) by mouth daily with breakfast. 07/01/13   Leeanne Rio, MD  sertraline (ZOLOFT) 100 MG tablet Take 1.5 tablets (150 mg total) by mouth daily. PATIENT NEEDS OFFICE VISIT FOR ADDITIONAL REFILLS 02/01/13   Theda Sers, PA-C   BP 133/87 mmHg  Pulse 85  Temp(Src) 98.9 F (37.2 C) (Oral)  Resp 14  SpO2 97% Physical Exam  Constitutional: She is oriented to person, place, and time. She appears well-developed and well-nourished. No distress.  HENT:  Head: Normocephalic and atraumatic.  Right Ear: External ear normal. Tympanic membrane is retracted.  Left Ear: External ear normal. Tympanic membrane is retracted.  Nose: Rhinorrhea (mild) present. Right sinus exhibits no maxillary sinus tenderness and no frontal sinus tenderness. Left sinus exhibits no maxillary sinus tenderness and no frontal sinus tenderness.  Mouth/Throat: Mucous membranes are not dry. Posterior oropharyngeal erythema present. No oropharyngeal exudate.  Neck: Neck supple.  Cardiovascular: Normal rate and normal heart sounds.   No murmur heard. Pulmonary/Chest: Effort normal and breath sounds normal. No respiratory distress. She has no wheezes. She has no rales.  Lymphadenopathy:    She has no cervical adenopathy.  Neurological: She is alert and oriented to person, place, and time.    ED Course  Procedures (including critical care time) Labs Review Labs Reviewed - No data to display  Imaging Review No results found.   MDM   1. Influenza    We'll treat with Tamiflu. Hycodan for cough. Also recommended honey, Tylenol, ibuprofen as needed. Work note provided. Follow-up with PCP if no improvement in 3-4 days.    Melony Overly, MD 01/23/14 1114

## 2014-01-23 NOTE — Discharge Instructions (Signed)
You likely have the flu. Take Tamiflu twice a day for 5 days. Use chloraseptic spray or cepacol lozenges for the sore throat. Tylenol or motrin for fevers or body aches. Use hycodan cough syrup every 4 hours as needed for cough.  This has a narcotic in it so do not drive while taking it. Use a teaspoon of honey as needed during the day for cough.  Followup with PCP if no improvement in the next 3-4 days.

## 2014-04-11 ENCOUNTER — Encounter: Payer: Self-pay | Admitting: Internal Medicine

## 2014-04-19 ENCOUNTER — Ambulatory Visit (AMBULATORY_SURGERY_CENTER): Payer: Self-pay | Admitting: *Deleted

## 2014-04-19 VITALS — Ht 66.0 in | Wt 244.0 lb

## 2014-04-19 DIAGNOSIS — Z1211 Encounter for screening for malignant neoplasm of colon: Secondary | ICD-10-CM

## 2014-04-19 MED ORDER — MOVIPREP 100 G PO SOLR: ORAL | Status: DC

## 2014-04-19 NOTE — Progress Notes (Signed)
Patient denies any allergies to eggs or soy. Patient denies any problems with anesthesia/sedation. Patient denies any oxygen use at home and does not take any diet/weight loss medications. EMMI education assisgned to patient on colonoscopy, this was explained and instructions given to patient. 

## 2014-04-24 ENCOUNTER — Encounter: Payer: Self-pay | Admitting: Internal Medicine

## 2014-05-01 ENCOUNTER — Encounter: Payer: Self-pay | Admitting: Internal Medicine

## 2014-05-01 ENCOUNTER — Ambulatory Visit (AMBULATORY_SURGERY_CENTER): Payer: BLUE CROSS/BLUE SHIELD | Admitting: Internal Medicine

## 2014-05-01 VITALS — BP 127/69 | HR 79 | Temp 98.8°F | Resp 38 | Ht 66.0 in | Wt 244.0 lb

## 2014-05-01 DIAGNOSIS — Z1211 Encounter for screening for malignant neoplasm of colon: Secondary | ICD-10-CM

## 2014-05-01 MED ORDER — SODIUM CHLORIDE 0.9 % IV SOLN: 500.0000 mL | INTRAVENOUS | Status: DC

## 2014-05-01 NOTE — Op Note (Signed)
[facility]  [address]. [place], [address]   COLONOSCOPY PROCEDURE REPORT  PATIENT: Chelsea Roberts, Chelsea Roberts  MR#: [id number] BIRTHDATE: 1955-05-16 , 74  yrs. old GENDER: female ENDOSCOPIST: Eustace Quail, MD REFERRED [id number] Criss Rosales, M.D. PROCEDURE DATE:  05/01/2014 PROCEDURE:   Colonoscopy, screening First Screening Colonoscopy - Avg.  risk and is 50 yrs.  old or older Yes.  Prior Negative Screening - Now for repeat screening. N/A  History of Adenoma - Now for follow-up colonoscopy & has been > or = to 3 yrs.  N/A ASA CLASS:   Class II INDICATIONS:Screening for colonic neoplasia and Colorectal Neoplasm Risk Assessment for this procedure is average risk. MEDICATIONS: Monitored anesthesia care and Propofol 400 mg IV  DESCRIPTION OF PROCEDURE:   After the risks benefits and alternatives of the procedure were thoroughly explained, informed consent was obtained.  The digital rectal exam revealed no abnormalities of the rectum.   The LB [id number]  endoscope was introduced through the anus and advanced to the cecum, which was identified by both the appendix and ileocecal valve. No adverse events experienced.   The quality of the prep was excellent. (MoviPrep was used)  The instrument was then slowly withdrawn as the colon was fully examined.    COLON FINDINGS: There was moderate diverticulosis noted in the sigmoid colon.   The examination was otherwise normal.  Retroflexed views revealed no abnormalities. The time to cecum = 2.8 Withdrawal time = 11.7   The scope was withdrawn and the procedure completed. COMPLICATIONS: There were no immediate complications.  ENDOSCOPIC IMPRESSION: 1.   Moderate diverticulosis was noted in the sigmoid colon 2.   The examination was otherwise normal . No polyps  RECOMMENDATIONS: 1. Continue current colorectal screening recommendations for "routine risk" patients with a repeat colonoscopy in 10 years.  eSigned:  Eustace Quail, MD 05/01/2014 9:00 AM   cc: Lucianne Lei, MD and The Patient

## 2014-05-01 NOTE — Patient Instructions (Signed)
YOU HAD AN ENDOSCOPIC PROCEDURE TODAY AT [facility]:   Refer to the procedure report that was given to you for any specific questions about what was found during the examination.  If the procedure report does not answer your questions, please call your gastroenterologist to clarify.  If you requested that your care partner not be given the details of your procedure findings, then the procedure report has been included in a sealed envelope for you to review at your convenience later.  YOU SHOULD EXPECT: Some feelings of bloating in the abdomen. Passage of more gas than usual.  Walking can help get rid of the air that was put into your GI tract during the procedure and reduce the bloating. If you had a lower endoscopy (such as a colonoscopy or flexible sigmoidoscopy) you may notice spotting of blood in your stool or on the toilet paper. If you underwent a bowel prep for your procedure, you may not have a normal bowel movement for a few days.  Please Note:  You might notice some irritation and congestion in your nose or some drainage.  This is from the oxygen used during your procedure.  There is no need for concern and it should clear up in a day or so.  SYMPTOMS TO REPORT IMMEDIATELY:   Following lower endoscopy (colonoscopy or flexible sigmoidoscopy):  Excessive amounts of blood in the stool  Significant tenderness or worsening of abdominal pains  Swelling of the abdomen that is new, acute  Fever of 100F or higher    For urgent or emergent issues, a gastroenterologist can be reached at any hour by calling [phone].   DIET: Your first meal following the procedure should be a small meal and then it is ok to progress to your normal diet. Heavy or fried foods are harder to digest and may make you feel nauseous or bloated.  Likewise, meals heavy in dairy and vegetables can increase bloating.  Drink plenty of fluids but you should avoid alcoholic beverages for 24  hours.  ACTIVITY:  You should plan to take it easy for the rest of today and you should NOT DRIVE or use heavy machinery until tomorrow (because of the sedation medicines used during the test).    FOLLOW UP: Our staff will call the number listed on your records the next business day following your procedure to check on you and address any questions or concerns that you may have regarding the information given to you following your procedure. If we do not reach you, we will leave a message.  However, if you are feeling well and you are not experiencing any problems, there is no need to return our call.  We will assume that you have returned to your regular daily activities without incident.  If any biopsies were taken you will be contacted by phone or by letter within the next 1-3 weeks.  Please call us at [phone] if you have not heard about the biopsies in 3 weeks.    SIGNATURES/CONFIDENTIALITY: You and/or your care partner have signed paperwork which will be entered into your electronic medical record.  These signatures attest to the fact that that the information above on your After Visit Summary has been reviewed and is understood.  Full responsibility of the confidentiality of this discharge information lies with you and/or your care-partner.   Information on diverticulosis given to you today

## 2014-05-01 NOTE — Progress Notes (Signed)
To recovery, report to [place], RNM, VSS.

## 2014-05-02 ENCOUNTER — Telehealth: Payer: Self-pay

## 2014-05-02 NOTE — Telephone Encounter (Signed)
  Follow up Call-  Call back number 05/01/2014  Post procedure Call Back phone  # [phone]  Permission to leave phone message Yes     Patient questions:  Do you have a fever, pain , or abdominal swelling? No. Pain Score  0 *  Have you tolerated food without any problems? Yes.    Have you been able to return to your normal activities? Yes.    Do you have any questions about your discharge instructions: Diet   No. Medications  No. Follow up visit  No.  Do you have questions or concerns about your Care? No.  Actions: * If pain score is 4 or above: No action needed, pain <4.

## 2015-05-09 DIAGNOSIS — Z713 Dietary counseling and surveillance: Secondary | ICD-10-CM | POA: Diagnosis not present

## 2015-05-12 DIAGNOSIS — M1A9XX0 Chronic gout, unspecified, without tophus (tophi): Secondary | ICD-10-CM | POA: Diagnosis not present

## 2015-05-12 DIAGNOSIS — Z6841 Body Mass Index (BMI) 40.0 and over, adult: Secondary | ICD-10-CM | POA: Diagnosis not present

## 2015-05-12 DIAGNOSIS — R7309 Other abnormal glucose: Secondary | ICD-10-CM | POA: Diagnosis not present

## 2015-05-12 DIAGNOSIS — F330 Major depressive disorder, recurrent, mild: Secondary | ICD-10-CM | POA: Diagnosis not present

## 2015-05-12 DIAGNOSIS — I129 Hypertensive chronic kidney disease with stage 1 through stage 4 chronic kidney disease, or unspecified chronic kidney disease: Secondary | ICD-10-CM | POA: Diagnosis not present

## 2015-06-21 DIAGNOSIS — F419 Anxiety disorder, unspecified: Secondary | ICD-10-CM | POA: Diagnosis not present

## 2015-06-21 DIAGNOSIS — J3089 Other allergic rhinitis: Secondary | ICD-10-CM | POA: Diagnosis not present

## 2015-06-25 DIAGNOSIS — R05 Cough: Secondary | ICD-10-CM | POA: Diagnosis not present

## 2015-07-11 DIAGNOSIS — Z713 Dietary counseling and surveillance: Secondary | ICD-10-CM | POA: Diagnosis not present

## 2015-08-21 DIAGNOSIS — M8589 Other specified disorders of bone density and structure, multiple sites: Secondary | ICD-10-CM | POA: Diagnosis not present

## 2015-09-12 DIAGNOSIS — Z713 Dietary counseling and surveillance: Secondary | ICD-10-CM | POA: Diagnosis not present

## 2015-09-20 DIAGNOSIS — E0840 Diabetes mellitus due to underlying condition with diabetic neuropathy, unspecified: Secondary | ICD-10-CM | POA: Diagnosis not present

## 2015-09-20 DIAGNOSIS — M1A9XX0 Chronic gout, unspecified, without tophus (tophi): Secondary | ICD-10-CM | POA: Diagnosis not present

## 2015-09-20 DIAGNOSIS — E669 Obesity, unspecified: Secondary | ICD-10-CM | POA: Diagnosis not present

## 2015-09-20 DIAGNOSIS — I129 Hypertensive chronic kidney disease with stage 1 through stage 4 chronic kidney disease, or unspecified chronic kidney disease: Secondary | ICD-10-CM | POA: Diagnosis not present

## 2015-09-20 DIAGNOSIS — I10 Essential (primary) hypertension: Secondary | ICD-10-CM | POA: Diagnosis not present

## 2015-09-20 DIAGNOSIS — F330 Major depressive disorder, recurrent, mild: Secondary | ICD-10-CM | POA: Diagnosis not present

## 2015-10-20 DIAGNOSIS — E0840 Diabetes mellitus due to underlying condition with diabetic neuropathy, unspecified: Secondary | ICD-10-CM | POA: Diagnosis not present

## 2015-10-20 DIAGNOSIS — M1A9XX0 Chronic gout, unspecified, without tophus (tophi): Secondary | ICD-10-CM | POA: Diagnosis not present

## 2015-10-20 DIAGNOSIS — Z23 Encounter for immunization: Secondary | ICD-10-CM | POA: Diagnosis not present

## 2015-10-20 DIAGNOSIS — I129 Hypertensive chronic kidney disease with stage 1 through stage 4 chronic kidney disease, or unspecified chronic kidney disease: Secondary | ICD-10-CM | POA: Diagnosis not present

## 2015-11-14 DIAGNOSIS — Z713 Dietary counseling and surveillance: Secondary | ICD-10-CM | POA: Diagnosis not present

## 2015-12-17 DIAGNOSIS — F419 Anxiety disorder, unspecified: Secondary | ICD-10-CM | POA: Diagnosis not present

## 2015-12-17 DIAGNOSIS — J3089 Other allergic rhinitis: Secondary | ICD-10-CM | POA: Diagnosis not present

## 2015-12-17 DIAGNOSIS — I10 Essential (primary) hypertension: Secondary | ICD-10-CM | POA: Diagnosis not present

## 2016-01-08 DIAGNOSIS — Z713 Dietary counseling and surveillance: Secondary | ICD-10-CM | POA: Diagnosis not present

## 2016-01-26 DIAGNOSIS — I129 Hypertensive chronic kidney disease with stage 1 through stage 4 chronic kidney disease, or unspecified chronic kidney disease: Secondary | ICD-10-CM | POA: Diagnosis not present

## 2016-01-26 DIAGNOSIS — I10 Essential (primary) hypertension: Secondary | ICD-10-CM | POA: Diagnosis not present

## 2016-01-26 DIAGNOSIS — E669 Obesity, unspecified: Secondary | ICD-10-CM | POA: Diagnosis not present

## 2016-01-26 DIAGNOSIS — M1A9XX0 Chronic gout, unspecified, without tophus (tophi): Secondary | ICD-10-CM | POA: Diagnosis not present

## 2016-01-26 DIAGNOSIS — Z6841 Body Mass Index (BMI) 40.0 and over, adult: Secondary | ICD-10-CM | POA: Diagnosis not present

## 2016-01-26 DIAGNOSIS — E0840 Diabetes mellitus due to underlying condition with diabetic neuropathy, unspecified: Secondary | ICD-10-CM | POA: Diagnosis not present

## 2016-01-26 DIAGNOSIS — F330 Major depressive disorder, recurrent, mild: Secondary | ICD-10-CM | POA: Diagnosis not present

## 2016-02-18 DIAGNOSIS — M10062 Idiopathic gout, left knee: Secondary | ICD-10-CM | POA: Diagnosis not present

## 2016-02-18 DIAGNOSIS — J0101 Acute recurrent maxillary sinusitis: Secondary | ICD-10-CM | POA: Diagnosis not present

## 2016-02-18 DIAGNOSIS — I129 Hypertensive chronic kidney disease with stage 1 through stage 4 chronic kidney disease, or unspecified chronic kidney disease: Secondary | ICD-10-CM | POA: Diagnosis not present

## 2017-01-20 HISTORY — PX: BREAST BIOPSY: SHX20

## 2017-01-20 HISTORY — PX: COLONOSCOPY: SHX174

## 2017-02-28 DIAGNOSIS — I129 Hypertensive chronic kidney disease with stage 1 through stage 4 chronic kidney disease, or unspecified chronic kidney disease: Secondary | ICD-10-CM | POA: Diagnosis not present

## 2017-02-28 DIAGNOSIS — R351 Nocturia: Secondary | ICD-10-CM | POA: Diagnosis not present

## 2017-02-28 DIAGNOSIS — M1A9XX0 Chronic gout, unspecified, without tophus (tophi): Secondary | ICD-10-CM | POA: Diagnosis not present

## 2017-02-28 DIAGNOSIS — E669 Obesity, unspecified: Secondary | ICD-10-CM | POA: Diagnosis not present

## 2017-03-03 ENCOUNTER — Ambulatory Visit (HOSPITAL_COMMUNITY)
Admission: EM | Admit: 2017-03-03 | Discharge: 2017-03-03 | Disposition: A | Discharge status: Home / Self Care | Payer: BLUE CROSS/BLUE SHIELD | Attending: Family Medicine | Admitting: Family Medicine

## 2017-03-03 ENCOUNTER — Encounter (HOSPITAL_COMMUNITY): Payer: Self-pay | Admitting: Emergency Medicine

## 2017-03-03 DIAGNOSIS — G4733 Obstructive sleep apnea (adult) (pediatric): Secondary | ICD-10-CM | POA: Diagnosis not present

## 2017-03-03 DIAGNOSIS — N189 Chronic kidney disease, unspecified: Secondary | ICD-10-CM | POA: Insufficient documentation

## 2017-03-03 DIAGNOSIS — J069 Acute upper respiratory infection, unspecified: Secondary | ICD-10-CM | POA: Diagnosis not present

## 2017-03-03 DIAGNOSIS — Z801 Family history of malignant neoplasm of trachea, bronchus and lung: Secondary | ICD-10-CM | POA: Insufficient documentation

## 2017-03-03 DIAGNOSIS — E785 Hyperlipidemia, unspecified: Secondary | ICD-10-CM | POA: Insufficient documentation

## 2017-03-03 DIAGNOSIS — Z841 Family history of disorders of kidney and ureter: Secondary | ICD-10-CM | POA: Insufficient documentation

## 2017-03-03 DIAGNOSIS — Z833 Family history of diabetes mellitus: Secondary | ICD-10-CM | POA: Diagnosis not present

## 2017-03-03 DIAGNOSIS — F419 Anxiety disorder, unspecified: Secondary | ICD-10-CM | POA: Insufficient documentation

## 2017-03-03 DIAGNOSIS — Z8371 Family history of colonic polyps: Secondary | ICD-10-CM | POA: Diagnosis not present

## 2017-03-03 DIAGNOSIS — B9789 Other viral agents as the cause of diseases classified elsewhere: Secondary | ICD-10-CM | POA: Diagnosis not present

## 2017-03-03 DIAGNOSIS — M109 Gout, unspecified: Secondary | ICD-10-CM | POA: Insufficient documentation

## 2017-03-03 DIAGNOSIS — F329 Major depressive disorder, single episode, unspecified: Secondary | ICD-10-CM | POA: Diagnosis not present

## 2017-03-03 DIAGNOSIS — Z79899 Other long term (current) drug therapy: Secondary | ICD-10-CM | POA: Diagnosis not present

## 2017-03-03 DIAGNOSIS — Z90710 Acquired absence of both cervix and uterus: Secondary | ICD-10-CM | POA: Diagnosis not present

## 2017-03-03 DIAGNOSIS — E039 Hypothyroidism, unspecified: Secondary | ICD-10-CM | POA: Insufficient documentation

## 2017-03-03 DIAGNOSIS — I130 Hypertensive heart and chronic kidney disease with heart failure and stage 1 through stage 4 chronic kidney disease, or unspecified chronic kidney disease: Secondary | ICD-10-CM | POA: Insufficient documentation

## 2017-03-03 DIAGNOSIS — I5022 Chronic systolic (congestive) heart failure: Secondary | ICD-10-CM | POA: Diagnosis not present

## 2017-03-03 DIAGNOSIS — Z8249 Family history of ischemic heart disease and other diseases of the circulatory system: Secondary | ICD-10-CM | POA: Insufficient documentation

## 2017-03-03 DIAGNOSIS — Z888 Allergy status to other drugs, medicaments and biological substances status: Secondary | ICD-10-CM | POA: Diagnosis not present

## 2017-03-03 DIAGNOSIS — E1122 Type 2 diabetes mellitus with diabetic chronic kidney disease: Secondary | ICD-10-CM | POA: Diagnosis not present

## 2017-03-03 DIAGNOSIS — Z882 Allergy status to sulfonamides status: Secondary | ICD-10-CM | POA: Insufficient documentation

## 2017-03-03 LAB — POCT RAPID STREP A: Streptococcus, Group A Screen (Direct): NEGATIVE

## 2017-03-03 MED ORDER — BENZONATATE 200 MG PO CAPS: 200.0000 mg | ORAL_CAPSULE | Freq: Three times a day (TID) | ORAL | 0 refills | Status: DC | PRN

## 2017-03-03 NOTE — ED Provider Notes (Signed)
[facility]    CSN: [id number] Arrival date & time: 03/03/17  1215     History   Chief Complaint Chief Complaint  Patient presents with  . URI    HPI ELINA STRENG is a 62 y.o. female history of hypertension, CKD, CHF and sleep apnea. Patient is presenting with URI symptoms- congestion, cough, sore throat.  She also endorsing fever since it was 101 this morning took Tylenol around 10.  Patient's main complaints are congestion and sore throat. Symptoms have been going on for 3 days. Patient has tried Tylenol, Alka-Seltzer cold and flu, with minimal relief. Denies nausea, vomiting, diarrhea. Denies chest pain.  Does have shortness of breath occasionally.  Denies history of asthma, denies smoking history.  Requesting work note.  HPI  Past Medical History:  Diagnosis Date  . Anxiety   . Breast mass   . Chronic kidney disease   . Complication of anesthesia    severe htn-had to take 2 ativan preop  . Depression   . Diabetes mellitus borderline   glucose intolerance  . Gout   . Gout   . Hyperlipidemia   . Hypertension   . OSA (obstructive sleep apnea)    uses cpap--does not use   . Sleep apnea    NO CPAP use per pt.  . Systolic CHF, chronic ([facility]) 2013    Patient Active Problem List   Diagnosis Date Noted  . Prediabetes 05/31/2012  . Breast mass in female 03/20/2012  . Gout 11/05/2011  . Chest pain 04/03/2011  . HTN (hypertension) 04/03/2011  . Hyperlipidemia 04/03/2011  . Chronic kidney disease 04/03/2011  . Systolic CHF, chronic ([facility]) 04/03/2011  . Obstructive sleep apnea 04/03/2011  . Hypothyroidism 04/03/2011    Past Surgical History:  Procedure Laterality Date  . ABDOMINAL HYSTERECTOMY  1999   partial  . BREAST BIOPSY  01/01/2012   Procedure: BREAST BIOPSY WITH NEEDLE LOCALIZATION;  Surgeon: Haywood Lasso, MD;  Location: [facility];  Service: General;  Laterality: Right;  Needle localization excision Right breast mass  .  BREAST CYST EXCISION  12/2011    OB History    No data available       Home Medications    Prior to Admission medications   Medication Sig Start Date End Date Taking? Authorizing Provider  amLODipine-olmesartan (AZOR) 10-40 MG per tablet Take 1 tablet by mouth daily.   Yes [provider]  carvedilol (COREG) 25 MG tablet Take 25 mg by mouth 3 (three) times daily.   Yes [provider]  colchicine 0.6 MG tablet Take 1 tablet (0.6 mg total) by mouth daily. 06/30/13  Yes Leeanne Rio, MD  hydrALAZINE (APRESOLINE) 10 MG tablet Take 10 mg by mouth 3 (three) times daily.   Yes [provider]  LORazepam (ATIVAN) 1 MG tablet Take 1 tablet (1 mg total) by mouth 2 (two) times daily. 01/25/12  Yes Wardell Honour, MD  Multiple Vitamin (MULITIVITAMIN WITH MINERALS) TABS Take 1 tablet by mouth daily.   Yes [provider]  sertraline (ZOLOFT) 100 MG tablet Take 1.5 tablets (150 mg total) by mouth daily. PATIENT NEEDS OFFICE VISIT FOR ADDITIONAL REFILLS 02/01/13  Yes Bailey Mech E, PA-C  benzonatate (TESSALON) 200 MG capsule Take 1 capsule (200 mg total) by mouth 3 (three) times daily as needed for cough. 03/03/17   Johany Hansman C, PA-C  Febuxostat (ULORIC) 80 MG TABS Take 1 tablet (80 mg total) by mouth daily. Patient  taking differently: Take 40 mg by mouth daily.  04/15/12   Darlyne Russian, MD  Liraglutide -Weight Management (SAXENDA ) Inject 1 each into the skin daily.    [provider]    Family History Family History  Problem Relation Age of Onset  . Heart failure Sister   . Diabetes Sister   . Hypertension Sister   . Kidney disease Sister   . Hypertension Sister   . Diabetes Sister   . Colon polyps Sister   . Hypertension Mother   . Hypertension Sister   . Cancer Father        lung cancer; smoker and asbestos exposure  . Diabetes Father   . Cancer Maternal Aunt        ovarian  . Cancer Maternal Grandmother        breast  .  Colon cancer Maternal Grandmother 42  . Diabetes type II Sister     Social History Social History   Tobacco Use  . Smoking status: Never Smoker  . Smokeless tobacco: Never Used  Substance Use Topics  . Alcohol use: No  . Drug use: No     Allergies   Clonidine derivatives; Diovan [valsartan]; Hctz [hydrochlorothiazide]; Hydralazine; Lisinopril; Norvasc [amlodipine besylate]; Procardia [nifedipine]; Sulfa antibiotics; and Tekturna [aliskiren]   Review of Systems Review of Systems  Constitutional: Positive for fever. Negative for chills and fatigue.  HENT: Positive for congestion, ear pain, rhinorrhea, sinus pressure and sore throat. Negative for trouble swallowing.   Respiratory: Positive for cough and shortness of breath. Negative for chest tightness.   Cardiovascular: Negative for chest pain.  Gastrointestinal: Negative for abdominal pain, nausea and vomiting.  Musculoskeletal: Negative for myalgias.  Skin: Negative for rash.  Neurological: Negative for dizziness, light-headedness and headaches.     Physical Exam Triage Vital Signs ED Triage Vitals  Enc Vitals Group     BP 03/03/17 1245 (!) 174/109     Pulse Rate 03/03/17 1242 70     Resp 03/03/17 1242 16     Temp 03/03/17 1242 99 F (37.2 C)     Temp Source 03/03/17 1242 Oral     SpO2 03/03/17 1242 98 %     Weight 03/03/17 1244 218 lb (98.9 kg)     Height 03/03/17 1244 5\' 7"  (1.702 m)     Head Circumference --      Peak Flow --      Pain Score 03/03/17 1243 8     Pain Loc --      Pain Edu? --      Excl. in [place]? --    No data found.  Updated Vital Signs BP (!) 174/109   Pulse 70   Temp 99 F (37.2 C) (Oral)   Resp 16   Ht 5\' 7"  (1.702 m)   Wt 218 lb (98.9 kg)   SpO2 98%   BMI 34.14 kg/m   Visual Acuity Right Eye Distance:   Left Eye Distance:   Bilateral Distance:    Right Eye Near:   Left Eye Near:    Bilateral Near:     Physical Exam  Constitutional: She is oriented to person, place, and  time. She appears well-developed and well-nourished. No distress.  HENT:  Head: Normocephalic and atraumatic.  Bilateral TMs nonerythematous, nasal mucosa erythematous, with dry rhinorrhea.  Posterior oropharynx with erythema, minimal tonsillar enlargement, no exudate.  Eyes: Conjunctivae are normal.  Neck: Normal range of motion. Neck supple.  No cervical lymphadenopathy  Cardiovascular:  Normal rate and regular rhythm.  No murmur heard. Pulmonary/Chest: Effort normal and breath sounds normal. No respiratory distress.  Breathing comfortably at rest, clear to auscultation bilaterally, no adventitious sounds appreciated  Abdominal: Soft. There is no tenderness.  Musculoskeletal: She exhibits no edema.  Neurological: She is alert and oriented to person, place, and time.  Skin: Skin is warm and dry.  Psychiatric: She has a normal mood and affect.  Nursing note and vitals reviewed.    UC Treatments / Results  Labs (all labs ordered are listed, but only abnormal results are displayed) Labs Reviewed  CULTURE, GROUP A STREP [facility])    EKG  EKG Interpretation None       Radiology No results found.  Procedures Procedures (including critical care time)  Medications Ordered in UC Medications - No data to display   Initial Impression / Assessment and Plan / UC Course  I have reviewed the triage vital signs and the nursing notes.  Pertinent labs & imaging results that were available during my care of the patient were reviewed by me and considered in my medical decision making (see chart for details).     Patient presents with symptoms likely from a viral upper respiratory infection vs. influenza. Differential includes bacterial pneumonia, sinusitis, allergic rhinitis, bronchitis. Do not suspect underlying cardiopulmonary process. Symptoms seem unlikely related to ACS, CHF or COPD exacerbations, pneumonia, pneumothorax. Patient is nontoxic appearing and not in need of emergent  medical intervention.  Test negative. Recommended symptom control with over the counter medications: Daily oral anti-histamine, Oral decongestant or IN corticosteroid, saline irrigations, cepacol lozenges, Robitussin, Delsym, honey tea.  Return if symptoms fail to improve in 1-2 weeks or you develop shortness of breath, chest pain, severe headache. Patient states understanding and is agreeable. Discussed strict return precautions. Patient verbalized understanding and is agreeable with plan.  Work note provided.   Final Clinical Impressions(s) / UC Diagnoses   Final diagnoses:  Viral URI with cough    ED Discharge Orders        Ordered    benzonatate (TESSALON) 200 MG capsule  3 times daily PRN     03/03/17 1303       Controlled Substance Prescriptions [place] Controlled Substance Registry consulted? Not Applicable   Janith Lima, [place] 03/03/17 1326

## 2017-03-03 NOTE — ED Triage Notes (Signed)
PT reports cough, congestion, and left ear pain that started Sunday.

## 2017-03-03 NOTE — Discharge Instructions (Signed)
You likely having a viral upper respiratory infection. We recommended symptom control. I expect your symptoms to start improving in the next 1-2 weeks.   1. Take a daily allergy pill/anti-histamine like Zyrtec, Claritin, or Store brand consistently for 2 weeks  2. For congestion you may try an oral decongestant like Mucinex. You may also try intranasal flonase nasal spray or saline irrigations (neti pot, sinus cleanse)  3. For your sore throat you may try cepacol lozenges, salt water gargles, throat spray. Treatment of congestion may also help your sore throat.  4. For cough you may try Delsym, Robitussin, or Tessalon which I prescribed.  5. Take Tylenol or Ibuprofen to help with pain/inflammation, fever  6. Stay hydrated, drink plenty of fluids to keep throat coated and less irritated  Honey Tea For cough/sore throat try using a honey-based tea. Use 3 teaspoons of honey with juice squeezed from half lemon. Place shaved pieces of ginger into 1/2-1 cup of water and warm over stove top. Then mix the ingredients and repeat every 4 hours as needed.

## 2017-03-06 LAB — CULTURE, GROUP A STREP (THRC)

## 2017-03-28 DIAGNOSIS — J0101 Acute recurrent maxillary sinusitis: Secondary | ICD-10-CM | POA: Diagnosis not present

## 2017-06-19 DIAGNOSIS — Z1231 Encounter for screening mammogram for malignant neoplasm of breast: Secondary | ICD-10-CM | POA: Diagnosis not present

## 2017-06-25 DIAGNOSIS — M1A9XX0 Chronic gout, unspecified, without tophus (tophi): Secondary | ICD-10-CM | POA: Diagnosis not present

## 2017-06-25 DIAGNOSIS — E1100 Type 2 diabetes mellitus with hyperosmolarity without nonketotic hyperglycemic-hyperosmolar coma (NKHHC): Secondary | ICD-10-CM | POA: Diagnosis not present

## 2017-06-25 DIAGNOSIS — I129 Hypertensive chronic kidney disease with stage 1 through stage 4 chronic kidney disease, or unspecified chronic kidney disease: Secondary | ICD-10-CM | POA: Diagnosis not present

## 2017-06-25 DIAGNOSIS — E669 Obesity, unspecified: Secondary | ICD-10-CM | POA: Diagnosis not present

## 2017-07-14 DIAGNOSIS — R799 Abnormal finding of blood chemistry, unspecified: Secondary | ICD-10-CM | POA: Diagnosis not present

## 2017-07-14 DIAGNOSIS — R921 Mammographic calcification found on diagnostic imaging of breast: Secondary | ICD-10-CM | POA: Diagnosis not present

## 2017-07-14 DIAGNOSIS — N6321 Unspecified lump in the left breast, upper outer quadrant: Secondary | ICD-10-CM | POA: Diagnosis not present

## 2017-07-15 ENCOUNTER — Other Ambulatory Visit: Payer: Self-pay | Admitting: Radiology

## 2017-07-15 DIAGNOSIS — N6321 Unspecified lump in the left breast, upper outer quadrant: Secondary | ICD-10-CM | POA: Diagnosis not present

## 2017-07-15 DIAGNOSIS — N6012 Diffuse cystic mastopathy of left breast: Secondary | ICD-10-CM | POA: Diagnosis not present

## 2017-07-17 ENCOUNTER — Other Ambulatory Visit: Payer: Self-pay | Admitting: Radiology

## 2017-07-17 DIAGNOSIS — R928 Other abnormal and inconclusive findings on diagnostic imaging of breast: Secondary | ICD-10-CM | POA: Diagnosis not present

## 2017-07-17 DIAGNOSIS — N6489 Other specified disorders of breast: Secondary | ICD-10-CM | POA: Diagnosis not present

## 2017-07-25 DIAGNOSIS — M10062 Idiopathic gout, left knee: Secondary | ICD-10-CM | POA: Diagnosis not present

## 2017-07-25 DIAGNOSIS — I10 Essential (primary) hypertension: Secondary | ICD-10-CM | POA: Diagnosis not present

## 2017-07-25 DIAGNOSIS — I129 Hypertensive chronic kidney disease with stage 1 through stage 4 chronic kidney disease, or unspecified chronic kidney disease: Secondary | ICD-10-CM | POA: Diagnosis not present

## 2017-07-25 DIAGNOSIS — E1100 Type 2 diabetes mellitus with hyperosmolarity without nonketotic hyperglycemic-hyperosmolar coma (NKHHC): Secondary | ICD-10-CM | POA: Diagnosis not present

## 2017-07-31 DIAGNOSIS — N6022 Fibroadenosis of left breast: Secondary | ICD-10-CM | POA: Diagnosis not present

## 2017-08-07 ENCOUNTER — Ambulatory Visit: Payer: Self-pay | Admitting: General Surgery

## 2017-08-07 DIAGNOSIS — N6022 Fibroadenosis of left breast: Secondary | ICD-10-CM

## 2017-08-14 DIAGNOSIS — J209 Acute bronchitis, unspecified: Secondary | ICD-10-CM | POA: Diagnosis not present

## 2017-08-14 DIAGNOSIS — E1100 Type 2 diabetes mellitus with hyperosmolarity without nonketotic hyperglycemic-hyperosmolar coma (NKHHC): Secondary | ICD-10-CM | POA: Diagnosis not present

## 2017-08-14 DIAGNOSIS — R05 Cough: Secondary | ICD-10-CM | POA: Diagnosis not present

## 2017-08-14 DIAGNOSIS — E669 Obesity, unspecified: Secondary | ICD-10-CM | POA: Diagnosis not present

## 2017-08-24 DIAGNOSIS — J209 Acute bronchitis, unspecified: Secondary | ICD-10-CM | POA: Diagnosis not present

## 2017-08-24 DIAGNOSIS — E118 Type 2 diabetes mellitus with unspecified complications: Secondary | ICD-10-CM | POA: Diagnosis not present

## 2017-08-24 DIAGNOSIS — Z6823 Body mass index (BMI) 23.0-23.9, adult: Secondary | ICD-10-CM | POA: Diagnosis not present

## 2017-08-24 DIAGNOSIS — I10 Essential (primary) hypertension: Secondary | ICD-10-CM | POA: Diagnosis not present

## 2017-09-26 DIAGNOSIS — E118 Type 2 diabetes mellitus with unspecified complications: Secondary | ICD-10-CM | POA: Diagnosis not present

## 2017-09-26 DIAGNOSIS — I10 Essential (primary) hypertension: Secondary | ICD-10-CM | POA: Diagnosis not present

## 2017-09-26 DIAGNOSIS — Z6836 Body mass index (BMI) 36.0-36.9, adult: Secondary | ICD-10-CM | POA: Diagnosis not present

## 2017-10-05 ENCOUNTER — Encounter (HOSPITAL_BASED_OUTPATIENT_CLINIC_OR_DEPARTMENT_OTHER): Payer: Self-pay | Admitting: *Deleted

## 2017-10-05 ENCOUNTER — Other Ambulatory Visit: Payer: Self-pay

## 2017-10-06 ENCOUNTER — Encounter (HOSPITAL_BASED_OUTPATIENT_CLINIC_OR_DEPARTMENT_OTHER)
Admission: RE | Admit: 2017-10-06 | Discharge: 2017-10-06 | Disposition: A | Discharge status: Home / Self Care | Payer: BLUE CROSS/BLUE SHIELD | Source: Ambulatory Visit | Attending: General Surgery | Admitting: General Surgery

## 2017-10-06 DIAGNOSIS — Z882 Allergy status to sulfonamides status: Secondary | ICD-10-CM | POA: Diagnosis not present

## 2017-10-06 DIAGNOSIS — N6022 Fibroadenosis of left breast: Secondary | ICD-10-CM | POA: Diagnosis not present

## 2017-10-06 DIAGNOSIS — I132 Hypertensive heart and chronic kidney disease with heart failure and with stage 5 chronic kidney disease, or end stage renal disease: Secondary | ICD-10-CM | POA: Diagnosis not present

## 2017-10-06 DIAGNOSIS — I509 Heart failure, unspecified: Secondary | ICD-10-CM | POA: Diagnosis not present

## 2017-10-06 DIAGNOSIS — N6092 Unspecified benign mammary dysplasia of left breast: Secondary | ICD-10-CM | POA: Diagnosis not present

## 2017-10-06 DIAGNOSIS — G4730 Sleep apnea, unspecified: Secondary | ICD-10-CM | POA: Diagnosis not present

## 2017-10-06 DIAGNOSIS — Z79899 Other long term (current) drug therapy: Secondary | ICD-10-CM | POA: Diagnosis not present

## 2017-10-06 DIAGNOSIS — M1990 Unspecified osteoarthritis, unspecified site: Secondary | ICD-10-CM | POA: Diagnosis not present

## 2017-10-06 DIAGNOSIS — Z8249 Family history of ischemic heart disease and other diseases of the circulatory system: Secondary | ICD-10-CM | POA: Diagnosis not present

## 2017-10-06 DIAGNOSIS — E7800 Pure hypercholesterolemia, unspecified: Secondary | ICD-10-CM | POA: Diagnosis not present

## 2017-10-06 DIAGNOSIS — Z888 Allergy status to other drugs, medicaments and biological substances status: Secondary | ICD-10-CM | POA: Diagnosis not present

## 2017-10-06 DIAGNOSIS — N186 End stage renal disease: Secondary | ICD-10-CM | POA: Diagnosis not present

## 2017-10-06 DIAGNOSIS — E039 Hypothyroidism, unspecified: Secondary | ICD-10-CM | POA: Diagnosis not present

## 2017-10-06 NOTE — Progress Notes (Addendum)
Ensure pre surgery drink given with instructions to complete by [place], surgical soap given with instructions, pt verbalized understanding.  EKG reviewed by Dr. Fransisco Beau, will proceed with surgery as scheduled.

## 2017-10-07 DIAGNOSIS — N6089 Other benign mammary dysplasias of unspecified breast: Secondary | ICD-10-CM | POA: Diagnosis not present

## 2017-10-08 ENCOUNTER — Encounter (HOSPITAL_BASED_OUTPATIENT_CLINIC_OR_DEPARTMENT_OTHER): Payer: Self-pay

## 2017-10-09 ENCOUNTER — Ambulatory Visit (HOSPITAL_BASED_OUTPATIENT_CLINIC_OR_DEPARTMENT_OTHER): Payer: BLUE CROSS/BLUE SHIELD | Admitting: Certified Registered"

## 2017-10-09 ENCOUNTER — Ambulatory Visit (HOSPITAL_BASED_OUTPATIENT_CLINIC_OR_DEPARTMENT_OTHER)
Admission: RE | Admit: 2017-10-09 | Discharge: 2017-10-09 | Disposition: A | Discharge status: Home / Self Care | Payer: BLUE CROSS/BLUE SHIELD | Source: Ambulatory Visit | Attending: General Surgery | Admitting: General Surgery

## 2017-10-09 ENCOUNTER — Other Ambulatory Visit: Payer: Self-pay

## 2017-10-09 ENCOUNTER — Encounter (HOSPITAL_BASED_OUTPATIENT_CLINIC_OR_DEPARTMENT_OTHER)
Admission: RE | Disposition: A | Discharge status: Home / Self Care | Payer: Self-pay | Source: Ambulatory Visit | Attending: General Surgery

## 2017-10-09 ENCOUNTER — Encounter (HOSPITAL_BASED_OUTPATIENT_CLINIC_OR_DEPARTMENT_OTHER): Payer: Self-pay | Admitting: Certified Registered"

## 2017-10-09 DIAGNOSIS — E039 Hypothyroidism, unspecified: Secondary | ICD-10-CM | POA: Insufficient documentation

## 2017-10-09 DIAGNOSIS — N6099 Unspecified benign mammary dysplasia of unspecified breast: Secondary | ICD-10-CM | POA: Diagnosis not present

## 2017-10-09 DIAGNOSIS — M1990 Unspecified osteoarthritis, unspecified site: Secondary | ICD-10-CM | POA: Diagnosis not present

## 2017-10-09 DIAGNOSIS — N6022 Fibroadenosis of left breast: Secondary | ICD-10-CM | POA: Diagnosis not present

## 2017-10-09 DIAGNOSIS — N6092 Unspecified benign mammary dysplasia of left breast: Secondary | ICD-10-CM | POA: Insufficient documentation

## 2017-10-09 DIAGNOSIS — Z888 Allergy status to other drugs, medicaments and biological substances status: Secondary | ICD-10-CM | POA: Insufficient documentation

## 2017-10-09 DIAGNOSIS — E7800 Pure hypercholesterolemia, unspecified: Secondary | ICD-10-CM | POA: Diagnosis not present

## 2017-10-09 DIAGNOSIS — Z882 Allergy status to sulfonamides status: Secondary | ICD-10-CM | POA: Diagnosis not present

## 2017-10-09 DIAGNOSIS — I132 Hypertensive heart and chronic kidney disease with heart failure and with stage 5 chronic kidney disease, or end stage renal disease: Secondary | ICD-10-CM | POA: Diagnosis not present

## 2017-10-09 DIAGNOSIS — I509 Heart failure, unspecified: Secondary | ICD-10-CM | POA: Insufficient documentation

## 2017-10-09 DIAGNOSIS — N186 End stage renal disease: Secondary | ICD-10-CM | POA: Diagnosis not present

## 2017-10-09 DIAGNOSIS — N6089 Other benign mammary dysplasias of unspecified breast: Secondary | ICD-10-CM | POA: Diagnosis not present

## 2017-10-09 DIAGNOSIS — G4730 Sleep apnea, unspecified: Secondary | ICD-10-CM | POA: Diagnosis not present

## 2017-10-09 DIAGNOSIS — Z79899 Other long term (current) drug therapy: Secondary | ICD-10-CM | POA: Diagnosis not present

## 2017-10-09 DIAGNOSIS — N6489 Other specified disorders of breast: Secondary | ICD-10-CM | POA: Diagnosis not present

## 2017-10-09 DIAGNOSIS — R921 Mammographic calcification found on diagnostic imaging of breast: Secondary | ICD-10-CM | POA: Diagnosis not present

## 2017-10-09 DIAGNOSIS — Z8249 Family history of ischemic heart disease and other diseases of the circulatory system: Secondary | ICD-10-CM | POA: Diagnosis not present

## 2017-10-09 HISTORY — PX: BREAST LUMPECTOMY WITH RADIOACTIVE SEED LOCALIZATION: SHX6424

## 2017-10-09 HISTORY — DX: Prediabetes: R73.03

## 2017-10-09 SURGERY — BREAST LUMPECTOMY WITH RADIOACTIVE SEED LOCALIZATION
Anesthesia: General | Site: Breast | Laterality: Left

## 2017-10-09 MED ORDER — CEFAZOLIN SODIUM-DEXTROSE 1-4 GM/50ML-% IV SOLN
INTRAVENOUS | Status: AC
  Filled 2017-10-09: qty 50

## 2017-10-09 MED ORDER — FENTANYL CITRATE (PF) 100 MCG/2ML IJ SOLN
INTRAMUSCULAR | Status: AC
  Filled 2017-10-09: qty 2

## 2017-10-09 MED ORDER — HYDROMORPHONE HCL 1 MG/ML IJ SOLN: 0.2500 mg | INTRAMUSCULAR | Status: DC | PRN

## 2017-10-09 MED ORDER — CEFAZOLIN SODIUM-DEXTROSE 2-4 GM/100ML-% IV SOLN
INTRAVENOUS | Status: AC
  Filled 2017-10-09: qty 100

## 2017-10-09 MED ORDER — ACETAMINOPHEN 500 MG PO TABS
1000.0000 mg | ORAL_TABLET | ORAL | Status: AC
  Administered 2017-10-09: 1000 mg via ORAL

## 2017-10-09 MED ORDER — GABAPENTIN 300 MG PO CAPS
ORAL_CAPSULE | ORAL | Status: AC
  Filled 2017-10-09: qty 1

## 2017-10-09 MED ORDER — SCOPOLAMINE 1 MG/3DAYS TD PT72: 1.0000 | MEDICATED_PATCH | Freq: Once | TRANSDERMAL | Status: DC | PRN

## 2017-10-09 MED ORDER — GABAPENTIN 300 MG PO CAPS
300.0000 mg | ORAL_CAPSULE | ORAL | Status: AC
  Administered 2017-10-09: 300 mg via ORAL

## 2017-10-09 MED ORDER — ONDANSETRON HCL 4 MG/2ML IJ SOLN
INTRAMUSCULAR | Status: DC | PRN
  Administered 2017-10-09: 4 mg via INTRAVENOUS

## 2017-10-09 MED ORDER — BUPIVACAINE-EPINEPHRINE (PF) 0.25% -1:200000 IJ SOLN
INTRAMUSCULAR | Status: DC | PRN
  Administered 2017-10-09: 20 mL

## 2017-10-09 MED ORDER — CHLORHEXIDINE GLUCONATE CLOTH 2 % EX PADS: 6.0000 | MEDICATED_PAD | Freq: Once | CUTANEOUS | Status: DC

## 2017-10-09 MED ORDER — CEFAZOLIN SODIUM-DEXTROSE 2-4 GM/100ML-% IV SOLN
2.0000 g | INTRAVENOUS | Status: AC
  Administered 2017-10-09: 2 g via INTRAVENOUS

## 2017-10-09 MED ORDER — FENTANYL CITRATE (PF) 100 MCG/2ML IJ SOLN
50.0000 ug | INTRAMUSCULAR | Status: AC | PRN
  Administered 2017-10-09: 25 ug via INTRAVENOUS
  Administered 2017-10-09: 50 ug via INTRAVENOUS
  Administered 2017-10-09: 25 ug via INTRAVENOUS

## 2017-10-09 MED ORDER — HYDROCODONE-ACETAMINOPHEN 5-325 MG PO TABS: 1.0000 | ORAL_TABLET | Freq: Four times a day (QID) | ORAL | 0 refills | Status: DC | PRN

## 2017-10-09 MED ORDER — DEXAMETHASONE SODIUM PHOSPHATE 4 MG/ML IJ SOLN
INTRAMUSCULAR | Status: DC | PRN
  Administered 2017-10-09: 4 mg via INTRAVENOUS

## 2017-10-09 MED ORDER — EPHEDRINE SULFATE 50 MG/ML IJ SOLN
INTRAMUSCULAR | Status: DC | PRN
  Administered 2017-10-09: 10 mg via INTRAVENOUS

## 2017-10-09 MED ORDER — BUPIVACAINE-EPINEPHRINE (PF) 0.25% -1:200000 IJ SOLN
INTRAMUSCULAR | Status: AC
  Filled 2017-10-09: qty 30

## 2017-10-09 MED ORDER — MIDAZOLAM HCL 2 MG/2ML IJ SOLN
INTRAMUSCULAR | Status: AC
  Filled 2017-10-09: qty 2

## 2017-10-09 MED ORDER — LACTATED RINGERS IV SOLN
INTRAVENOUS | Status: DC
  Administered 2017-10-09: 07:00:00 via INTRAVENOUS

## 2017-10-09 MED ORDER — ACETAMINOPHEN 500 MG PO TABS
ORAL_TABLET | ORAL | Status: AC
  Filled 2017-10-09: qty 2

## 2017-10-09 MED ORDER — PROPOFOL 10 MG/ML IV BOLUS
INTRAVENOUS | Status: DC | PRN
  Administered 2017-10-09: 150 mg via INTRAVENOUS

## 2017-10-09 MED ORDER — LIDOCAINE HCL (CARDIAC) PF 100 MG/5ML IV SOSY
PREFILLED_SYRINGE | INTRAVENOUS | Status: DC | PRN
  Administered 2017-10-09: 30 mg via INTRAVENOUS

## 2017-10-09 MED ORDER — MIDAZOLAM HCL 2 MG/2ML IJ SOLN
1.0000 mg | INTRAMUSCULAR | Status: DC | PRN
  Administered 2017-10-09: 2 mg via INTRAVENOUS

## 2017-10-09 SURGICAL SUPPLY — 42 items
ADH SKN CLS APL DERMABOND .7 (GAUZE/BANDAGES/DRESSINGS) ×1
APPLIER CLIP 9.375 MED OPEN (MISCELLANEOUS)
APR CLP MED 9.3 20 MLT OPN (MISCELLANEOUS)
BLADE SURG 15 STRL LF DISP TIS (BLADE) ×1 IMPLANT
BLADE SURG 15 STRL SS (BLADE) ×2
CANISTER SUC SOCK COL 7IN (MISCELLANEOUS) ×2 IMPLANT
CANISTER SUCT 1200ML W/VALVE (MISCELLANEOUS) ×2 IMPLANT
CHLORAPREP W/TINT 26ML (MISCELLANEOUS) ×2 IMPLANT
CLIP APPLIE 9.375 MED OPEN (MISCELLANEOUS) IMPLANT
COVER BACK TABLE 60X90IN (DRAPES) ×2 IMPLANT
COVER MAYO STAND STRL (DRAPES) ×2 IMPLANT
COVER PROBE W GEL 5X96 (DRAPES) ×2 IMPLANT
DECANTER SPIKE VIAL GLASS SM (MISCELLANEOUS) IMPLANT
DERMABOND ADVANCED (GAUZE/BANDAGES/DRESSINGS) ×1
DERMABOND ADVANCED .7 DNX12 (GAUZE/BANDAGES/DRESSINGS) ×1 IMPLANT
DEVICE DUBIN W/COMP PLATE 8390 (MISCELLANEOUS) ×2 IMPLANT
DRAPE LAPAROSCOPIC ABDOMINAL (DRAPES) ×2 IMPLANT
DRAPE UTILITY XL STRL (DRAPES) ×2 IMPLANT
ELECT COATED BLADE 2.86 ST (ELECTRODE) ×2 IMPLANT
ELECT REM PT RETURN 9FT ADLT (ELECTROSURGICAL) ×2
ELECTRODE REM PT RTRN 9FT ADLT (ELECTROSURGICAL) ×1 IMPLANT
GLOVE BIO SURGEON STRL SZ7.5 (GLOVE) ×4 IMPLANT
GOWN STRL REUS W/ TWL LRG LVL3 (GOWN DISPOSABLE) ×2 IMPLANT
GOWN STRL REUS W/TWL LRG LVL3 (GOWN DISPOSABLE) ×4
ILLUMINATOR WAVEGUIDE N/F (MISCELLANEOUS) IMPLANT
KIT MARKER MARGIN INK (KITS) ×2 IMPLANT
LIGHT WAVEGUIDE WIDE FLAT (MISCELLANEOUS) IMPLANT
NDL HYPO 25X1 1.5 SAFETY (NEEDLE) IMPLANT
NEEDLE HYPO 25X1 1.5 SAFETY (NEEDLE) IMPLANT
NS IRRIG 1000ML POUR BTL (IV SOLUTION) IMPLANT
PACK BASIN DAY SURGERY FS (CUSTOM PROCEDURE TRAY) ×2 IMPLANT
PENCIL BUTTON HOLSTER BLD 10FT (ELECTRODE) ×2 IMPLANT
SLEEVE SCD COMPRESS KNEE MED (MISCELLANEOUS) ×2 IMPLANT
SPONGE LAP 18X18 RF (DISPOSABLE) ×2 IMPLANT
SUT MON AB 4-0 PC3 18 (SUTURE) IMPLANT
SUT SILK 2 0 SH (SUTURE) IMPLANT
SUT VICRYL 3-0 CR8 SH (SUTURE) ×2 IMPLANT
SYR CONTROL 10ML LL (SYRINGE) IMPLANT
TOWEL GREEN STERILE FF (TOWEL DISPOSABLE) ×2 IMPLANT
TOWEL OR NON WOVEN STRL DISP B (DISPOSABLE) ×2 IMPLANT
TUBE CONNECTING 20X1/4 (TUBING) ×2 IMPLANT
YANKAUER SUCT BULB TIP NO VENT (SUCTIONS) IMPLANT

## 2017-10-09 NOTE — Transfer of Care (Signed)
Immediate Anesthesia Transfer of Care Note  Patient: Chelsea Roberts  Procedure(s) Performed: BREAST LUMPECTOMY WITH RADIOACTIVE SEED LOCALIZATION (Left Breast)  Patient Location: PACU  Anesthesia Type:General  Level of Consciousness: drowsy and patient cooperative  Airway & Oxygen Therapy: Patient Spontanous Breathing and Patient connected to face mask oxygen  Post-op Assessment: Report given to RN and Post -op Vital signs reviewed and stable  Post vital signs: Reviewed and stable  Last Vitals:  Vitals Value Taken Time  BP    Temp    Pulse 62 10/09/2017  8:33 AM  Resp    SpO2 100 % 10/09/2017  8:33 AM  Vitals shown include unvalidated device data.  Last Pain:  Vitals:   10/09/17 0641  TempSrc: Oral  PainSc: 0-No pain         Complications: No apparent anesthesia complications

## 2017-10-09 NOTE — Anesthesia Procedure Notes (Signed)
Procedure Name: LMA Insertion Date/Time: 10/09/2017 7:42 AM Performed by: Signe Colt, CRNA Pre-anesthesia Checklist: Patient identified, Emergency Drugs available, Suction available and Patient being monitored Patient Re-evaluated:Patient Re-evaluated prior to induction Oxygen Delivery Method: Circle system utilized Preoxygenation: Pre-oxygenation with 100% oxygen Induction Type: IV induction Ventilation: Mask ventilation without difficulty LMA: LMA inserted LMA Size: 4.0 Number of attempts: 1 Airway Equipment and Method: Bite block Placement Confirmation: positive ETCO2 Tube secured with: Tape Dental Injury: Teeth and Oropharynx as per pre-operative assessment

## 2017-10-09 NOTE — Anesthesia Postprocedure Evaluation (Signed)
Anesthesia Post Note  Patient: Chelsea Roberts  Procedure(s) Performed: BREAST LUMPECTOMY WITH RADIOACTIVE SEED LOCALIZATION (Left Breast)     Patient location during evaluation: PACU Anesthesia Type: General Level of consciousness: awake Pain management: pain level controlled Vital Signs Assessment: post-procedure vital signs reviewed and stable Respiratory status: spontaneous breathing Cardiovascular status: stable Postop Assessment: no apparent nausea or vomiting Anesthetic complications: no    Last Vitals:  Vitals:   10/09/17 0833 10/09/17 0845  BP: (!) 141/77 (!) 151/76  Pulse: 62 64  Resp: 13 16  Temp: (!) 36.3 C   SpO2: 100% 100%    Last Pain:  Vitals:   10/09/17 0845  TempSrc:   PainSc: 0-No pain                 Alynn Ellithorpe

## 2017-10-09 NOTE — H&P (Signed)
Chelsea Roberts  Location: [facility] Surgery Patient #: [id number] DOB: Jun 05, 1955 Married / Language: English / Race: Black or African American Female   History of Present Illness  The patient is a 62 year old female who presents with a breast mass. We are asked to see the patient in consultation by Dr. Johnnette Gourd to evaluate her for a complex sclerosing lesion of the left breast. The patient is a 61 year old black female who recently went for a routine screening mammogram. At that time she was found to have an area of distortion in the upper outer left breast. This measured 1.5 cm. It was biopsied and came back as a complex sclerosing lesion. She denies any breast pain or discharge from the nipple. She denies any family history of breast cancer. She does have a family history of lung cancer in her father and colon cancer in a maternal grandmother. She does not smoke.   Past Surgical History Breast Biopsy  Bilateral. Hysterectomy (not due to cancer) - Partial   Diagnostic Studies History  Colonoscopy  1-5 years ago Mammogram  within last year  Allergies  Sulfa Drugs   Medication History  LORazepam (1MG  Tablet, Oral) Active. Amlodipine-Olmesartan (10-40MG  Tablet, Oral) Active. Azithromycin (250MG  Tablet, Oral) Active. Carvedilol (25MG  Tablet, Oral) Active. HydrALAZINE HCl (25MG  Tablet, Oral) Active. Norel AD (4-10-325MG  Tablet, Oral) Active. Sertraline HCl (100MG  Tablet, Oral) Active.  Social History Caffeine use  Carbonated beverages. No alcohol use  No drug use  Tobacco use  Never smoker.  Family History Arthritis  Mother, Sister. Colon Polyps  Sister. Diabetes Mellitus  Sister. Family history unknown  First Degree Relatives  Heart Disease  Mother. Hypertension  Brother, Father, Mother, Sister. Kidney Disease  Sister. Prostate Cancer  Father.  Pregnancy / Birth History  Age at menarche  6 years. Age of menopause   48-60 Gravida  0 Irregular periods  Para  0  Other Problems  Arthritis  Back Pain  Cerebrovascular Accident  Chronic Renal Failure Syndrome  High blood pressure  Hypercholesterolemia     Review of Systems  General Not Present- Appetite Loss, Chills, Fatigue, Fever, Night Sweats, Weight Gain and Weight Loss. Skin Not Present- Change in Wart/Mole, Dryness, Hives, Jaundice, New Lesions, Non-Healing Wounds, Rash and Ulcer. HEENT Present- Seasonal Allergies. Not Present- Earache, Hearing Loss, Hoarseness, Nose Bleed, Oral Ulcers, Ringing in the Ears, Sinus Pain, Sore Throat, Visual Disturbances, Wears glasses/contact lenses and Yellow Eyes. Respiratory Not Present- Bloody sputum, Chronic Cough, Difficulty Breathing, Snoring and Wheezing. Cardiovascular Present- Swelling of Extremities. Not Present- Chest Pain, Difficulty Breathing Lying Down, Leg Cramps, Palpitations, Rapid Heart Rate and Shortness of Breath. Gastrointestinal Not Present- Abdominal Pain, Bloating, Bloody Stool, Change in Bowel Habits, Chronic diarrhea, Constipation, Difficulty Swallowing, Excessive gas, Gets full quickly at meals, Hemorrhoids, Indigestion, Nausea, Rectal Pain and Vomiting. Female Genitourinary Present- Nocturia. Not Present- Frequency, Painful Urination, Pelvic Pain and Urgency. Musculoskeletal Present- Back Pain. Not Present- Joint Pain, Joint Stiffness, Muscle Pain, Muscle Weakness and Swelling of Extremities. Neurological Not Present- Decreased Memory, Fainting, Headaches, Numbness, Seizures, Tingling, Tremor, Trouble walking and Weakness. Psychiatric Present- Anxiety. Not Present- Bipolar, Change in Sleep Pattern, Depression, Fearful and Frequent crying. Endocrine Not Present- Cold Intolerance, Excessive Hunger, Hair Changes, Heat Intolerance, Hot flashes and New Diabetes. Hematology Not Present- Blood Thinners, Easy Bruising, Excessive bleeding, Gland problems, HIV and Persistent  Infections.  Vitals Weight: 229.8 lb Height: 68in Body Surface Area: 2.17 m Body Mass Index: 34.94 kg/m  Temp.: 63F  Pulse:  66 (Regular)  BP: 150/90 (Sitting, Left Arm, Standard)       Physical Exam  General Mental Status-Alert. General Appearance-Consistent with stated age. Hydration-Well hydrated. Voice-Normal.  Head and Neck Head-normocephalic, atraumatic with no lesions or palpable masses. Trachea-midline. Thyroid Gland Characteristics - normal size and consistency.  Eye Eyeball - Bilateral-Extraocular movements intact. Sclera/Conjunctiva - Bilateral-No scleral icterus.  Chest and Lung Exam Chest and lung exam reveals -quiet, even and easy respiratory effort with no use of accessory muscles and on auscultation, normal breath sounds, no adventitious sounds and normal vocal resonance. Inspection Chest Wall - Normal. Back - normal.  Breast Note: There is no palpable mass in either breast. There is no palpable axillary, supraclavicular, or cervical lymphadenopathy.   Cardiovascular Cardiovascular examination reveals -normal heart sounds, regular rate and rhythm with no murmurs and normal pedal pulses bilaterally.  Abdomen Inspection Inspection of the abdomen reveals - No Hernias. Skin - Scar - no surgical scars. Palpation/Percussion Palpation and Percussion of the abdomen reveal - Soft, Non Tender, No Rebound tenderness, No Rigidity (guarding) and No hepatosplenomegaly. Auscultation Auscultation of the abdomen reveals - Bowel sounds normal.  Neurologic Neurologic evaluation reveals -alert and oriented x 3 with no impairment of recent or remote memory. Mental Status-Normal.  Musculoskeletal Normal Exam - Left-Upper Extremity Strength Normal and Lower Extremity Strength Normal. Normal Exam - Right-Upper Extremity Strength Normal and Lower Extremity Strength Normal.  Lymphatic Head & Neck  General Head & Neck  Lymphatics: Bilateral - Description - Normal. Axillary  General Axillary Region: Bilateral - Description - Normal. Tenderness - Non Tender. Femoral & Inguinal  Generalized Femoral & Inguinal Lymphatics: Bilateral - Description - Normal. Tenderness - Non Tender.    Assessment & Plan  SCLEROSING ADENOSIS OF BREAST, LEFT (N60.22) Impression: The patient appears to have a 1.5 cm area of complex sclerosing lesion in the upper outer left breast. Because of the size of the distortion and because there is a 5-10% chance of missing some benefit think it would be reasonable to remove this area. I have discussed with her in detail the risks and benefits of the operation as well as some of the technical aspects and she understands and wishes to proceed. I will plan for a left breast radioactive seed localized lumpectomy

## 2017-10-09 NOTE — Op Note (Signed)
10/09/2017  8:32 AM  PATIENT:  Chelsea Roberts  62 y.o. female  PRE-OPERATIVE DIAGNOSIS:  LEFT BREAST COMPLEX SCLEROSING LESION  POST-OPERATIVE DIAGNOSIS:  LEFT BREAST COMPLEX SCLEROSING LESION  PROCEDURE:  Procedure(s): BREAST LUMPECTOMY WITH RADIOACTIVE SEED LOCALIZATION (Left)  SURGEON:  Surgeon(s) and Role:    Jovita Kussmaul, MD - Primary  PHYSICIAN ASSISTANT:   ASSISTANTS: none   ANESTHESIA:   local and general  EBL:  minimal   BLOOD ADMINISTERED:none  DRAINS: none   LOCAL MEDICATIONS USED:  MARCAINE     SPECIMEN:  Source of Specimen:  left breast tissue  DISPOSITION OF SPECIMEN:  PATHOLOGY  COUNTS:  YES  TOURNIQUET:  * No tourniquets in log *  DICTATION: .Dragon Dictation   After informed consent was obtained the patient was brought to the operating room and placed in the supine position on the operating table.  After adequate induction of general anesthesia the patient's left breast was prepped with ChloraPrep, allowed to dry, and draped in usual sterile manner.  An appropriate timeout was performed.  Previously an I-125 seed was placed in the upper outer quadrant of the left breast to mark an area of a complex sclerosing lesion.  The neoprobe was set to I-125 in the area of radioactivity was readily identified.  The area around this was infiltrated with quarter percent Marcaine.  A curvilinear incision was made along the upper outer edge of the areole of the left breast with a 15 blade knife.  The incision was carried through the skin and subcutaneous tissue sharply with electrocautery.  Dissection was then carried towards the radioactive seed sharply with the electrocautery under the direction of the neoprobe.  Side more closely approached the radioactive seed I then removed a circular portion of breast tissue sharply around the radioactive seed while checking the area of radioactivity frequently.  Once the specimen was removed it was oriented with the appropriate  paint colors.  A specimen radiograph was obtained that showed the clip and seed to be near the center of the specimen.  The specimen was then sent to pathology for further evaluation.  Hemostasis was achieved using the Bovie electrocautery.  The wound was infiltrated with more quarter percent Marcaine and irrigated with saline.  The deep layer of the wound was then closed with layers of interrupted 3-0 Vicryl stitches.  The skin was then closed with interrupted 4-0 Monocryl subcuticular stitches.  Dermabond dressings were applied.  The patient tolerated the procedure well.  At the end of the case all needle sponge and instrument counts were correct.  The patient was then awakened and taken to recovery in stable condition.  PLAN OF CARE: Discharge to home after PACU  PATIENT DISPOSITION:  PACU - hemodynamically stable.   Delay start of Pharmacological VTE agent (>24hrs) due to surgical blood loss or risk of bleeding: not applicable

## 2017-10-09 NOTE — Anesthesia Preprocedure Evaluation (Signed)
Anesthesia Evaluation  Patient identified by MRN, date of birth, ID band Patient awake    Reviewed: Allergy & Precautions, NPO status , Patient's Chart, lab work & pertinent test results  Airway Mallampati: II  TM Distance: >3 FB     Dental   Pulmonary sleep apnea ,    breath sounds clear to auscultation       Cardiovascular hypertension, +CHF   Rhythm:Regular Rate:Normal     Neuro/Psych    GI/Hepatic negative GI ROS, Neg liver ROS,   Endo/Other  Hypothyroidism   Renal/GU Renal disease     Musculoskeletal   Abdominal   Peds  Hematology   Anesthesia Other Findings   Reproductive/Obstetrics                             Anesthesia Physical Anesthesia Plan  ASA: III  Anesthesia Plan: General   Post-op Pain Management:    Induction: Intravenous  PONV Risk Score and Plan: Ondansetron, Dexamethasone and Midazolam  Airway Management Planned: LMA  Additional Equipment:   Intra-op Plan:   Post-operative Plan: Extubation in OR  Informed Consent: I have reviewed the patients History and Physical, chart, labs and discussed the procedure including the risks, benefits and alternatives for the proposed anesthesia with the patient or authorized representative who has indicated his/her understanding and acceptance.   Dental advisory given  Plan Discussed with: CRNA and Anesthesiologist  Anesthesia Plan Comments:         Anesthesia Quick Evaluation

## 2017-10-09 NOTE — Interval H&P Note (Signed)
History and Physical Interval Note:  10/09/2017 7:17 AM  Chelsea Roberts  has presented today for surgery, with the diagnosis of LEFT BREAST COMPLEX SCLEROSING LESION  The various methods of treatment have been discussed with the patient and family. After consideration of risks, benefits and other options for treatment, the patient has consented to  Procedure(s): BREAST LUMPECTOMY WITH RADIOACTIVE SEED LOCALIZATION (Left) as a surgical intervention .  The patient's history has been reviewed, patient examined, no change in status, stable for surgery.  I have reviewed the patient's chart and labs.  Questions were answered to the patient's satisfaction.     TOTH III,Kloey Cazarez S

## 2017-10-09 NOTE — Discharge Instructions (Signed)

## 2017-10-12 ENCOUNTER — Encounter (HOSPITAL_BASED_OUTPATIENT_CLINIC_OR_DEPARTMENT_OTHER): Payer: Self-pay | Admitting: General Surgery

## 2017-10-22 DIAGNOSIS — E1100 Type 2 diabetes mellitus with hyperosmolarity without nonketotic hyperglycemic-hyperosmolar coma (NKHHC): Secondary | ICD-10-CM | POA: Diagnosis not present

## 2017-10-22 DIAGNOSIS — Z23 Encounter for immunization: Secondary | ICD-10-CM | POA: Diagnosis not present

## 2017-10-22 DIAGNOSIS — F330 Major depressive disorder, recurrent, mild: Secondary | ICD-10-CM | POA: Diagnosis not present

## 2017-10-22 DIAGNOSIS — M1A9XX0 Chronic gout, unspecified, without tophus (tophi): Secondary | ICD-10-CM | POA: Diagnosis not present

## 2017-10-28 DIAGNOSIS — I129 Hypertensive chronic kidney disease with stage 1 through stage 4 chronic kidney disease, or unspecified chronic kidney disease: Secondary | ICD-10-CM | POA: Diagnosis not present

## 2017-10-28 DIAGNOSIS — M1A9XX0 Chronic gout, unspecified, without tophus (tophi): Secondary | ICD-10-CM | POA: Diagnosis not present

## 2017-10-28 DIAGNOSIS — E669 Obesity, unspecified: Secondary | ICD-10-CM | POA: Diagnosis not present

## 2017-10-28 DIAGNOSIS — F330 Major depressive disorder, recurrent, mild: Secondary | ICD-10-CM | POA: Diagnosis not present

## 2017-11-07 DIAGNOSIS — I120 Hypertensive chronic kidney disease with stage 5 chronic kidney disease or end stage renal disease: Secondary | ICD-10-CM | POA: Diagnosis not present

## 2017-11-07 DIAGNOSIS — Z6836 Body mass index (BMI) 36.0-36.9, adult: Secondary | ICD-10-CM | POA: Diagnosis not present

## 2017-11-07 DIAGNOSIS — F330 Major depressive disorder, recurrent, mild: Secondary | ICD-10-CM | POA: Diagnosis not present

## 2017-11-07 DIAGNOSIS — E1100 Type 2 diabetes mellitus with hyperosmolarity without nonketotic hyperglycemic-hyperosmolar coma (NKHHC): Secondary | ICD-10-CM | POA: Diagnosis not present

## 2017-11-20 DIAGNOSIS — E119 Type 2 diabetes mellitus without complications: Secondary | ICD-10-CM | POA: Diagnosis not present

## 2017-11-20 DIAGNOSIS — I10 Essential (primary) hypertension: Secondary | ICD-10-CM | POA: Diagnosis not present

## 2017-11-20 DIAGNOSIS — M109 Gout, unspecified: Secondary | ICD-10-CM | POA: Diagnosis not present

## 2017-11-20 DIAGNOSIS — N184 Chronic kidney disease, stage 4 (severe): Secondary | ICD-10-CM | POA: Diagnosis not present

## 2017-11-26 DIAGNOSIS — M10061 Idiopathic gout, right knee: Secondary | ICD-10-CM | POA: Diagnosis not present

## 2017-11-26 DIAGNOSIS — I120 Hypertensive chronic kidney disease with stage 5 chronic kidney disease or end stage renal disease: Secondary | ICD-10-CM | POA: Diagnosis not present

## 2017-11-26 DIAGNOSIS — Z6836 Body mass index (BMI) 36.0-36.9, adult: Secondary | ICD-10-CM | POA: Diagnosis not present

## 2017-12-04 DIAGNOSIS — N184 Chronic kidney disease, stage 4 (severe): Secondary | ICD-10-CM | POA: Diagnosis not present

## 2018-01-01 DIAGNOSIS — E1100 Type 2 diabetes mellitus with hyperosmolarity without nonketotic hyperglycemic-hyperosmolar coma (NKHHC): Secondary | ICD-10-CM | POA: Diagnosis not present

## 2018-01-01 DIAGNOSIS — E669 Obesity, unspecified: Secondary | ICD-10-CM | POA: Diagnosis not present

## 2018-01-01 DIAGNOSIS — J0101 Acute recurrent maxillary sinusitis: Secondary | ICD-10-CM | POA: Diagnosis not present

## 2018-01-01 DIAGNOSIS — I129 Hypertensive chronic kidney disease with stage 1 through stage 4 chronic kidney disease, or unspecified chronic kidney disease: Secondary | ICD-10-CM | POA: Diagnosis not present

## 2018-08-30 ENCOUNTER — Other Ambulatory Visit: Payer: Self-pay

## 2018-08-30 ENCOUNTER — Inpatient Hospital Stay (HOSPITAL_COMMUNITY)
Admission: EM | Admit: 2018-08-30 | Discharge: 2018-09-06 | DRG: 264 | Disposition: A | Discharge status: Home / Self Care | Payer: Medicaid Other | Attending: Internal Medicine | Admitting: Internal Medicine

## 2018-08-30 ENCOUNTER — Inpatient Hospital Stay (HOSPITAL_COMMUNITY): Payer: Medicaid Other

## 2018-08-30 ENCOUNTER — Encounter (HOSPITAL_COMMUNITY): Payer: Self-pay | Admitting: Internal Medicine

## 2018-08-30 ENCOUNTER — Emergency Department (HOSPITAL_COMMUNITY): Payer: Medicaid Other

## 2018-08-30 DIAGNOSIS — M1A9XX1 Chronic gout, unspecified, with tophus (tophi): Secondary | ICD-10-CM | POA: Diagnosis present

## 2018-08-30 DIAGNOSIS — E785 Hyperlipidemia, unspecified: Secondary | ICD-10-CM | POA: Diagnosis present

## 2018-08-30 DIAGNOSIS — I132 Hypertensive heart and chronic kidney disease with heart failure and with stage 5 chronic kidney disease, or end stage renal disease: Principal | ICD-10-CM | POA: Diagnosis present

## 2018-08-30 DIAGNOSIS — I10 Essential (primary) hypertension: Secondary | ICD-10-CM | POA: Diagnosis not present

## 2018-08-30 DIAGNOSIS — F419 Anxiety disorder, unspecified: Secondary | ICD-10-CM | POA: Diagnosis present

## 2018-08-30 DIAGNOSIS — D631 Anemia in chronic kidney disease: Secondary | ICD-10-CM | POA: Diagnosis present

## 2018-08-30 DIAGNOSIS — R7303 Prediabetes: Secondary | ICD-10-CM | POA: Diagnosis present

## 2018-08-30 DIAGNOSIS — N186 End stage renal disease: Secondary | ICD-10-CM | POA: Diagnosis present

## 2018-08-30 DIAGNOSIS — Z8673 Personal history of transient ischemic attack (TIA), and cerebral infarction without residual deficits: Secondary | ICD-10-CM

## 2018-08-30 DIAGNOSIS — N181 Chronic kidney disease, stage 1: Secondary | ICD-10-CM | POA: Diagnosis not present

## 2018-08-30 DIAGNOSIS — M10019 Idiopathic gout, unspecified shoulder: Secondary | ICD-10-CM | POA: Diagnosis not present

## 2018-08-30 DIAGNOSIS — E039 Hypothyroidism, unspecified: Secondary | ICD-10-CM | POA: Diagnosis present

## 2018-08-30 DIAGNOSIS — Z833 Family history of diabetes mellitus: Secondary | ICD-10-CM

## 2018-08-30 DIAGNOSIS — N189 Chronic kidney disease, unspecified: Secondary | ICD-10-CM | POA: Diagnosis present

## 2018-08-30 DIAGNOSIS — I5022 Chronic systolic (congestive) heart failure: Secondary | ICD-10-CM | POA: Diagnosis present

## 2018-08-30 DIAGNOSIS — E7800 Pure hypercholesterolemia, unspecified: Secondary | ICD-10-CM | POA: Diagnosis present

## 2018-08-30 DIAGNOSIS — Z79899 Other long term (current) drug therapy: Secondary | ICD-10-CM

## 2018-08-30 DIAGNOSIS — N179 Acute kidney failure, unspecified: Secondary | ICD-10-CM | POA: Diagnosis present

## 2018-08-30 DIAGNOSIS — Z90710 Acquired absence of both cervix and uterus: Secondary | ICD-10-CM | POA: Diagnosis not present

## 2018-08-30 DIAGNOSIS — Z20828 Contact with and (suspected) exposure to other viral communicable diseases: Secondary | ICD-10-CM | POA: Diagnosis present

## 2018-08-30 DIAGNOSIS — D649 Anemia, unspecified: Secondary | ICD-10-CM

## 2018-08-30 DIAGNOSIS — E782 Mixed hyperlipidemia: Secondary | ICD-10-CM | POA: Diagnosis not present

## 2018-08-30 DIAGNOSIS — M109 Gout, unspecified: Secondary | ICD-10-CM | POA: Diagnosis present

## 2018-08-30 DIAGNOSIS — I160 Hypertensive urgency: Secondary | ICD-10-CM | POA: Diagnosis present

## 2018-08-30 DIAGNOSIS — G4733 Obstructive sleep apnea (adult) (pediatric): Secondary | ICD-10-CM | POA: Diagnosis present

## 2018-08-30 DIAGNOSIS — E872 Acidosis: Secondary | ICD-10-CM | POA: Diagnosis present

## 2018-08-30 DIAGNOSIS — E034 Atrophy of thyroid (acquired): Secondary | ICD-10-CM | POA: Diagnosis not present

## 2018-08-30 DIAGNOSIS — N185 Chronic kidney disease, stage 5: Secondary | ICD-10-CM

## 2018-08-30 DIAGNOSIS — N170 Acute kidney failure with tubular necrosis: Secondary | ICD-10-CM | POA: Diagnosis not present

## 2018-08-30 DIAGNOSIS — F329 Major depressive disorder, single episode, unspecified: Secondary | ICD-10-CM | POA: Diagnosis present

## 2018-08-30 DIAGNOSIS — R0682 Tachypnea, not elsewhere classified: Secondary | ICD-10-CM

## 2018-08-30 DIAGNOSIS — N2581 Secondary hyperparathyroidism of renal origin: Secondary | ICD-10-CM | POA: Diagnosis present

## 2018-08-30 DIAGNOSIS — I150 Renovascular hypertension: Secondary | ICD-10-CM | POA: Diagnosis not present

## 2018-08-30 DIAGNOSIS — N184 Chronic kidney disease, stage 4 (severe): Secondary | ICD-10-CM | POA: Diagnosis not present

## 2018-08-30 LAB — TROPONIN I (HIGH SENSITIVITY)
Troponin I (High Sensitivity): 40 ng/L — ABNORMAL HIGH (ref <18)
Troponin I (High Sensitivity): 42 ng/L — ABNORMAL HIGH (ref <18)

## 2018-08-30 LAB — FERRITIN: Ferritin: 226 ng/mL (ref 11–307)

## 2018-08-30 LAB — GLUCOSE, CAPILLARY
Glucose-Capillary: 89 mg/dL (ref 70–99)
Glucose-Capillary: 178 mg/dL — ABNORMAL HIGH (ref 70–99)

## 2018-08-30 LAB — MAGNESIUM: Magnesium: 2.3 mg/dL (ref 1.7–2.4)

## 2018-08-30 LAB — URINALYSIS, COMPLETE (UACMP) WITH MICROSCOPIC
Bilirubin Urine: NEGATIVE
Glucose, UA: 50 mg/dL — AB
Hgb urine dipstick: NEGATIVE
Ketones, ur: NEGATIVE mg/dL
Nitrite: NEGATIVE
Protein, ur: 100 mg/dL — AB
Specific Gravity, Urine: 1.009 (ref 1.005–1.030)
pH: 5 (ref 5.0–8.0)

## 2018-08-30 LAB — CBC
HCT: 25.7 % — ABNORMAL LOW (ref 36.0–46.0)
Hemoglobin: 7.7 g/dL — ABNORMAL LOW (ref 12.0–15.0)
MCH: 24.3 pg — ABNORMAL LOW (ref 26.0–34.0)
MCHC: 30 g/dL (ref 30.0–36.0)
MCV: 81.1 fL (ref 80.0–100.0)
Platelets: 213 10*3/uL (ref 150–400)
RBC: 3.17 MIL/uL — ABNORMAL LOW (ref 3.87–5.11)
RDW: 15.4 % (ref 11.5–15.5)
WBC: 7.7 10*3/uL (ref 4.0–10.5)
nRBC: 0 % (ref 0.0–0.2)

## 2018-08-30 LAB — PROTEIN / CREATININE RATIO, URINE
Creatinine, Urine: 53.01 mg/dL
Protein Creatinine Ratio: 2.64 mg/mg{Cre} — ABNORMAL HIGH (ref 0.00–0.15)
Total Protein, Urine: 140 mg/dL

## 2018-08-30 LAB — BASIC METABOLIC PANEL
Anion gap: 15 (ref 5–15)
BUN: 71 mg/dL — ABNORMAL HIGH (ref 8–23)
CO2: 16 mmol/L — ABNORMAL LOW (ref 22–32)
Calcium: 8.8 mg/dL — ABNORMAL LOW (ref 8.9–10.3)
Chloride: 109 mmol/L (ref 98–111)
Creatinine, Ser: 7.49 mg/dL — ABNORMAL HIGH (ref 0.44–1.00)
GFR calc Af Amer: 6 mL/min — ABNORMAL LOW (ref >60)
GFR calc non Af Amer: 5 mL/min — ABNORMAL LOW (ref >60)
Glucose, Bld: 129 mg/dL — ABNORMAL HIGH (ref 70–99)
Potassium: 3.8 mmol/L (ref 3.5–5.1)
Sodium: 140 mmol/L (ref 135–145)

## 2018-08-30 LAB — IRON AND TIBC
Iron: 17 ug/dL — ABNORMAL LOW (ref 28–170)
Saturation Ratios: 7 % — ABNORMAL LOW (ref 10.4–31.8)
TIBC: 228 ug/dL — ABNORMAL LOW (ref 250–450)
UIBC: 211 ug/dL

## 2018-08-30 LAB — HEMOGLOBIN A1C
Hgb A1c MFr Bld: 5.7 % — ABNORMAL HIGH (ref 4.8–5.6)
Mean Plasma Glucose: 116.89 mg/dL

## 2018-08-30 LAB — RETICULOCYTES
Immature Retic Fract: 15.3 % (ref 2.3–15.9)
RBC.: 3.17 MIL/uL — ABNORMAL LOW (ref 3.87–5.11)
Retic Count, Absolute: 35.2 10*3/uL (ref 19.0–186.0)
Retic Ct Pct: 1.1 % (ref 0.4–3.1)

## 2018-08-30 LAB — CREATININE, URINE, RANDOM: Creatinine, Urine: 51.35 mg/dL

## 2018-08-30 LAB — TSH: TSH: 2.084 u[IU]/mL (ref 0.350–4.500)

## 2018-08-30 LAB — SODIUM, URINE, RANDOM: Sodium, Ur: 67 mmol/L

## 2018-08-30 LAB — FOLATE: Folate: 50.4 ng/mL (ref >5.9)

## 2018-08-30 LAB — PHOSPHORUS: Phosphorus: 6.8 mg/dL — ABNORMAL HIGH (ref 2.5–4.6)

## 2018-08-30 LAB — SARS CORONAVIRUS 2 (TAT 6-24 HRS): SARS Coronavirus 2: NEGATIVE

## 2018-08-30 LAB — VITAMIN B12: Vitamin B-12: 594 pg/mL (ref 180–914)

## 2018-08-30 MED ORDER — HYDRALAZINE HCL 50 MG PO TABS
100.0000 mg | ORAL_TABLET | Freq: Three times a day (TID) | ORAL | Status: DC
  Administered 2018-08-30: 100 mg via ORAL

## 2018-08-30 MED ORDER — CALCIUM CARBONATE ANTACID 1250 MG/5ML PO SUSP: 500.0000 mg | Freq: Four times a day (QID) | ORAL | Status: DC | PRN

## 2018-08-30 MED ORDER — ACETAMINOPHEN 325 MG PO TABS: 650.0000 mg | ORAL_TABLET | Freq: Four times a day (QID) | ORAL | Status: DC | PRN

## 2018-08-30 MED ORDER — STERILE WATER FOR INJECTION IV SOLN
INTRAVENOUS | Status: DC
  Administered 2018-08-30 – 2018-09-02 (×4): via INTRAVENOUS
  Filled 2018-08-30 (×5): qty 850

## 2018-08-30 MED ORDER — ONDANSETRON HCL 4 MG PO TABS: 4.0000 mg | ORAL_TABLET | Freq: Four times a day (QID) | ORAL | Status: DC | PRN

## 2018-08-30 MED ORDER — HYDROXYZINE HCL 25 MG PO TABS
25.0000 mg | ORAL_TABLET | Freq: Three times a day (TID) | ORAL | Status: DC | PRN
  Administered 2018-09-03: 25 mg via ORAL
  Filled 2018-08-30: qty 1

## 2018-08-30 MED ORDER — DOCUSATE SODIUM 283 MG RE ENEM
1.0000 | ENEMA | RECTAL | Status: DC | PRN
  Filled 2018-08-30: qty 1

## 2018-08-30 MED ORDER — ACETAMINOPHEN 650 MG RE SUPP: 650.0000 mg | Freq: Four times a day (QID) | RECTAL | Status: DC | PRN

## 2018-08-30 MED ORDER — SODIUM BICARBONATE-DEXTROSE 150-5 MEQ/L-% IV SOLN
150.0000 meq | INTRAVENOUS | Status: DC
  Filled 2018-08-30 (×2): qty 1000

## 2018-08-30 MED ORDER — COLCHICINE 0.6 MG PO TABS: 0.6000 mg | ORAL_TABLET | Freq: Every day | ORAL | Status: DC

## 2018-08-30 MED ORDER — ACETAMINOPHEN 325 MG PO TABS
650.0000 mg | ORAL_TABLET | Freq: Once | ORAL | Status: AC
  Administered 2018-08-30: 650 mg via ORAL
  Filled 2018-08-30: qty 2

## 2018-08-30 MED ORDER — NEPRO/CARBSTEADY PO LIQD: 237.0000 mL | Freq: Three times a day (TID) | ORAL | Status: DC | PRN

## 2018-08-30 MED ORDER — HYDRALAZINE HCL 25 MG PO TABS
100.0000 mg | ORAL_TABLET | Freq: Two times a day (BID) | ORAL | Status: DC
  Filled 2018-08-30: qty 4

## 2018-08-30 MED ORDER — FEBUXOSTAT 40 MG PO TABS
40.0000 mg | ORAL_TABLET | Freq: Every day | ORAL | Status: DC
  Administered 2018-08-31 – 2018-09-06 (×6): 40 mg via ORAL
  Filled 2018-08-30 (×8): qty 1

## 2018-08-30 MED ORDER — HYDRALAZINE HCL 50 MG PO TABS
100.0000 mg | ORAL_TABLET | Freq: Three times a day (TID) | ORAL | Status: DC
  Administered 2018-08-30 – 2018-09-06 (×20): 100 mg via ORAL
  Filled 2018-08-30 (×21): qty 2

## 2018-08-30 MED ORDER — ONDANSETRON HCL 4 MG/2ML IJ SOLN: 4.0000 mg | Freq: Four times a day (QID) | INTRAMUSCULAR | Status: DC | PRN

## 2018-08-30 MED ORDER — SODIUM BICARBONATE 8.4 % IV SOLN
INTRAVENOUS | Status: DC
  Filled 2018-08-30 (×3): qty 1000

## 2018-08-30 MED ORDER — HEPARIN SODIUM (PORCINE) 5000 UNIT/ML IJ SOLN
5000.0000 [IU] | Freq: Three times a day (TID) | INTRAMUSCULAR | Status: DC
  Administered 2018-08-30 – 2018-09-06 (×18): 5000 [IU] via SUBCUTANEOUS
  Filled 2018-08-30 (×19): qty 1

## 2018-08-30 MED ORDER — SODIUM CHLORIDE 0.9% FLUSH
3.0000 mL | Freq: Two times a day (BID) | INTRAVENOUS | Status: DC
  Administered 2018-08-30 – 2018-09-05 (×9): 3 mL via INTRAVENOUS

## 2018-08-30 MED ORDER — CARVEDILOL 12.5 MG PO TABS
25.0000 mg | ORAL_TABLET | Freq: Two times a day (BID) | ORAL | Status: DC
  Administered 2018-08-30 – 2018-08-31 (×2): 25 mg via ORAL
  Filled 2018-08-30 (×2): qty 2

## 2018-08-30 MED ORDER — SERTRALINE HCL 50 MG PO TABS
150.0000 mg | ORAL_TABLET | Freq: Every day | ORAL | Status: DC
  Administered 2018-08-31 – 2018-09-06 (×6): 150 mg via ORAL
  Filled 2018-08-30 (×6): qty 1

## 2018-08-30 MED ORDER — SORBITOL 70 % SOLN: 30.0000 mL | Status: DC | PRN

## 2018-08-30 MED ORDER — PREDNISONE 20 MG PO TABS
40.0000 mg | ORAL_TABLET | Freq: Every day | ORAL | Status: DC
  Administered 2018-08-30 – 2018-09-03 (×4): 40 mg via ORAL
  Filled 2018-08-30 (×4): qty 2

## 2018-08-30 MED ORDER — HYDRALAZINE HCL 20 MG/ML IJ SOLN
10.0000 mg | Freq: Once | INTRAMUSCULAR | Status: AC
  Administered 2018-08-30: 10 mg via INTRAVENOUS
  Filled 2018-08-30: qty 1

## 2018-08-30 MED ORDER — CAMPHOR-MENTHOL 0.5-0.5 % EX LOTN
1.0000 "application " | TOPICAL_LOTION | Freq: Three times a day (TID) | CUTANEOUS | Status: DC | PRN
  Filled 2018-08-30: qty 222

## 2018-08-30 MED ORDER — LORAZEPAM 1 MG PO TABS
1.0000 mg | ORAL_TABLET | Freq: Every day | ORAL | Status: DC
  Administered 2018-08-31: 1 mg via ORAL
  Filled 2018-08-30: qty 1

## 2018-08-30 MED ORDER — ZOLPIDEM TARTRATE 5 MG PO TABS: 5.0000 mg | ORAL_TABLET | Freq: Every evening | ORAL | Status: DC | PRN

## 2018-08-30 MED ORDER — HYDROCODONE-ACETAMINOPHEN 5-325 MG PO TABS
1.0000 | ORAL_TABLET | Freq: Four times a day (QID) | ORAL | Status: DC | PRN
  Administered 2018-09-01: 2 via ORAL
  Filled 2018-08-30: qty 2

## 2018-08-30 MED ORDER — INSULIN ASPART 100 UNIT/ML ~~LOC~~ SOLN
0.0000 [IU] | Freq: Three times a day (TID) | SUBCUTANEOUS | Status: DC
  Administered 2018-08-31: 13:00:00 1 [IU] via SUBCUTANEOUS
  Administered 2018-08-31: 2 [IU] via SUBCUTANEOUS
  Administered 2018-08-31: 1 [IU] via SUBCUTANEOUS
  Administered 2018-09-01: 2 [IU] via SUBCUTANEOUS
  Administered 2018-09-02: 1 [IU] via SUBCUTANEOUS
  Administered 2018-09-02: 13:00:00 3 [IU] via SUBCUTANEOUS
  Administered 2018-09-03: 2 [IU] via SUBCUTANEOUS
  Administered 2018-09-03: 1 [IU] via SUBCUTANEOUS

## 2018-08-30 NOTE — ED Notes (Signed)
ED TO INPATIENT HANDOFF REPORT  ED Nurse Name and Phone #: Harriette Bouillon [phone]  S Name/Age/Gender Chelsea Roberts 63 y.o. female Room/Bed: 022C/022C  Code Status   Code Status: Full Code  Home/SNF/Other Home Patient oriented to: self, place, time and situation Is this baseline? Yes   Triage Complete: Triage complete  Chief Complaint Gout  Triage Note Pt POV d/t stabbing 5/10  L.CP that started around 1700 08/29/2018 and chronic gout flare to R. UE and BLE. Pt states this AM she woke up feeling dizzy & lightheadedness    Allergies Allergies  Allergen Reactions  . Clonidine Derivatives     Extreme dry mouth  . Diovan [Valsartan]     headache  . Hctz [Hydrochlorothiazide]     gout  . Lisinopril Cough  . Norvasc [Amlodipine Besylate] Swelling  . Procardia [Nifedipine] Swelling  . Sulfa Antibiotics Swelling  . Tekturna [Aliskiren] Swelling    Level of Care/Admitting Diagnosis ED Disposition    ED Disposition Condition [facility] Area: [place] [[id number]]  Level of Care: Med-Surg [16]  Covid Evaluation: Asymptomatic Screening Protocol (No Symptoms)  Diagnosis: Acute renal failure superimposed on chronic kidney disease [facility]) [[id number]]  Admitting Physician: Karmen Bongo [[id number]]  Attending Physician: Karmen Bongo [[id number]]  Estimated length of stay: 3 - 4 days  Certification:: I certify this patient will need inpatient services for at least 2 midnights  PT Class (Do Not Modify): Inpatient [101]  PT Acc Code (Do Not Modify): Private [1]       B Medical/Surgery History Past Medical History:  Diagnosis Date  . Anxiety   . Breast mass   . Chronic kidney disease   . Complication of anesthesia    severe htn-had to take 2 ativan preop  . Depression   . Gout   . Hyperlipidemia   . Hypertension   . Pre-diabetes   . Sleep apnea    NO CPAP use per pt., lost 70lbs  . Systolic CHF, chronic ([place]) 2013   Past Surgical History:   Procedure Laterality Date  . ABDOMINAL HYSTERECTOMY  1999   partial  . BREAST BIOPSY  01/01/2012   Procedure: BREAST BIOPSY WITH NEEDLE LOCALIZATION;  Surgeon: Haywood Lasso, MD;  Location: [place];  Service: General;  Laterality: Right;  Needle localization excision Right breast mass  . BREAST CYST EXCISION  12/2011  . BREAST LUMPECTOMY WITH RADIOACTIVE SEED LOCALIZATION Left 10/09/2017   Procedure: BREAST LUMPECTOMY WITH RADIOACTIVE SEED LOCALIZATION;  Surgeon: Jovita Kussmaul, MD;  Location: [place];  Service: General;  Laterality: Left;     A IV Location/Drains/Wounds Patient Lines/Drains/Airways Status   Active Line/Drains/Airways    Name:   Placement date:   Placement time:   Site:   Days:   Peripheral IV 08/30/18 Left Antecubital   08/30/18    1259    Antecubital   less than 1   Airway   10/09/17    0829     325   Incision 01/01/12 Breast Right   01/01/12    1353     2433   Incision (Closed) 10/09/17 Breast Left   10/09/17    0835     325          Intake/Output Last 24 hours No intake or output data in the 24 hours ending 08/30/18 1520  Labs/Imaging Results for orders placed or performed during the hospital encounter of 08/30/18 (from the past  48 hour(s))  Basic metabolic panel     Status: Abnormal   Collection Time: 08/30/18  9:49 AM  Result Value Ref Range   Sodium 140 135 - 145 mmol/L   Potassium 3.8 3.5 - 5.1 mmol/L   Chloride 109 98 - 111 mmol/L   CO2 16 (L) 22 - 32 mmol/L   Glucose, Bld 129 (H) 70 - 99 mg/dL   BUN 71 (H) 8 - 23 mg/dL   Creatinine, Ser 7.49 (H) 0.44 - 1.00 mg/dL   Calcium 8.8 (L) 8.9 - 10.3 mg/dL   GFR calc non Af Amer 5 (L) >60 mL/min   GFR calc Af Amer 6 (L) >60 mL/min   Anion gap 15 5 - 15    Comment: Performed at [facility] [address], [place], [place] [address]  CBC     Status: Abnormal   Collection Time: 08/30/18  9:49 AM  Result Value Ref Range   WBC 7.7 4.0 - 10.5 K/uL   RBC 3.17  (L) 3.87 - 5.11 MIL/uL   Hemoglobin 7.7 (L) 12.0 - 15.0 g/dL   HCT 25.7 (L) 36.0 - 46.0 %   MCV 81.1 80.0 - 100.0 fL   MCH 24.3 (L) 26.0 - 34.0 pg   MCHC 30.0 30.0 - 36.0 g/dL   RDW 15.4 11.5 - 15.5 %   Platelets 213 150 - 400 K/uL   nRBC 0.0 0.0 - 0.2 %    Comment: Performed at [facility], [place] [address] [place], [place] [address]  Troponin I (High Sensitivity)     Status: Abnormal   Collection Time: 08/30/18  9:49 AM  Result Value Ref Range   Troponin I (High Sensitivity) 40 (H) <18 ng/L    Comment: (NOTE) Elevated high sensitivity troponin I (hsTnI) values and significant  changes across serial measurements may suggest ACS but many other  chronic and acute conditions are known to elevate hsTnI results.  Refer to the "Links" section for chest pain algorithms and additional  guidance. Performed at [facility], [place] [address], [place], [place] [address]   Troponin I (High Sensitivity)     Status: Abnormal   Collection Time: 08/30/18 12:49 PM  Result Value Ref Range   Troponin I (High Sensitivity) 42 (H) <18 ng/L    Comment: (NOTE) Elevated high sensitivity troponin I (hsTnI) values and significant  changes across serial measurements may suggest ACS but many other  chronic and acute conditions are known to elevate hsTnI results.  Refer to the "Links" section for chest pain algorithms and additional  guidance. Performed at [facility], [place] [address], [place], [place] [address]   Magnesium     Status: None   Collection Time: 08/30/18 12:49 PM  Result Value Ref Range   Magnesium 2.3 1.7 - 2.4 mg/dL    Comment: Performed at [facility], [place] [address], [place], [place] [address]  Phosphorus     Status: Abnormal   Collection Time: 08/30/18 12:49 PM  Result Value Ref Range   Phosphorus 6.8 (H) 2.5 - 4.6 mg/dL    Comment: Performed at [facility] [address], [place],  [address]   Dg Chest Port 1 View  Result Date:  08/30/2018 CLINICAL DATA:  Shortness of breath EXAM: PORTABLE CHEST 1 VIEW COMPARISON:  Chest radiograph 05/05/2012 FINDINGS: Cardiomegaly. Uncoiled and atherosclerotic thoracic aorta. There is increased density which projects over the medial left mediastinum, which was not  well appreciated on prior exams. Shallow inspiration radiograph with crowding of the central bronchovascular markings. Mild ill-defined opacity at the left lung base with mild indistinctness of the left hemidiaphragm. No evidence of pleural effusion or pneumothorax. No acute bony abnormality IMPRESSION: Shallow inspiration radiograph. Cardiomegaly. Mild ill-defined opacity at the left lung base may reflect atelectasis. Pneumonia cannot be excluded. Indeterminate increased density projecting over the medial left mediastinum, which was not well appreciated on prior exams. Recommend PA and lateral radiographs for further characterization when clinically feasible. Electronically Signed   By: Kellie Simmering   On: 08/30/2018 10:09    Pending Labs Unresulted Labs (From admission, onward)    Start     Ordered   08/31/18 2992  Basic metabolic panel  Tomorrow morning,   R     08/30/18 1356   08/31/18 0500  CBC  Tomorrow morning,   R     08/30/18 1356   08/31/18 0500  Lipid panel  Tomorrow morning,   R     08/30/18 1426   08/30/18 1430  Hemoglobin A1c  Once,   R     08/30/18 1430   08/30/18 1426  TSH  Once,   STAT     08/30/18 1426   08/30/18 1353  HIV antibody (Routine Testing)  Once,   STAT     08/30/18 1356   08/30/18 1301  Vitamin B12  (Anemia Panel (PNL))  ONCE - STAT,   STAT     08/30/18 1300   08/30/18 1301  Folate  (Anemia Panel (PNL))  ONCE - STAT,   STAT     08/30/18 1300   08/30/18 1301  Iron and TIBC  (Anemia Panel (PNL))  ONCE - STAT,   STAT     08/30/18 1300   08/30/18 1301  Ferritin  (Anemia Panel (PNL))  ONCE - STAT,   STAT     08/30/18 1300   08/30/18 1301  Reticulocytes  (Anemia Panel (PNL))  ONCE - STAT,   STAT      08/30/18 1300   08/30/18 1246  SARS CORONAVIRUS 2 Nasal Swab Aptima Multi Swab  (Asymptomatic/Tier 2 Patients Labs)  Once,   STAT    Question Answer Comment  Is this test for diagnosis or screening Screening   Symptomatic for COVID-19 as defined by CDC No   Hospitalized for COVID-19 No   Admitted to ICU for COVID-19 No   Previously tested for COVID-19 No   Resident in a congregate (group) care setting No   Employed in healthcare setting No   Pregnant No      08/30/18 1245          Vitals/Pain Today's Vitals   08/30/18 1145 08/30/18 1200 08/30/18 1215 08/30/18 1236  BP: (!) 188/174 (!) 215/111 (!) 215/96   Pulse: 79 68 70   Resp: (!) 23 20 (!) 25   Temp:      TempSrc:      SpO2: 96% 96% 96%   Weight:      Height:      PainSc:    0-No pain    Isolation Precautions No active isolations  Medications Medications  colchicine tablet 0.6 mg (has no administration in time range)  HYDROcodone-acetaminophen (NORCO/VICODIN) 5-325 MG per tablet 1-2 tablet (has no administration in time range)  carvedilol (COREG) tablet 25 mg (has no administration in time range)  hydrALAZINE (APRESOLINE) tablet 100 mg (has no administration in time range)  Febuxostat TABS 40 mg (has no administration  in time range)  LORazepam (ATIVAN) tablet 1 mg (has no administration in time range)  sertraline (ZOLOFT) tablet 150 mg (has no administration in time range)  acetaminophen (TYLENOL) tablet 650 mg (has no administration in time range)    Or  acetaminophen (TYLENOL) suppository 650 mg (has no administration in time range)  zolpidem (AMBIEN) tablet 5 mg (has no administration in time range)  sorbitol 70 % solution 30 mL (has no administration in time range)  docusate sodium (ENEMEEZ) enema 283 mg (has no administration in time range)  ondansetron (ZOFRAN) tablet 4 mg (has no administration in time range)    Or  ondansetron (ZOFRAN) injection 4 mg (has no administration in time range)   camphor-menthol (SARNA) lotion 1 application (has no administration in time range)    And  hydrOXYzine (ATARAX/VISTARIL) tablet 25 mg (has no administration in time range)  calcium carbonate (dosed in mg elemental calcium) suspension 500 mg of elemental calcium (has no administration in time range)  feeding supplement (NEPRO CARB STEADY) liquid 237 mL (has no administration in time range)  dextrose 5 % 1,000 mL with sodium bicarbonate 150 mEq infusion (has no administration in time range)  heparin injection 5,000 Units (has no administration in time range)  sodium chloride flush (NS) 0.9 % injection 3 mL (has no administration in time range)  insulin aspart (novoLOG) injection 0-9 Units (has no administration in time range)  predniSONE (DELTASONE) tablet 40 mg (has no administration in time range)  acetaminophen (TYLENOL) tablet 650 mg (650 mg Oral Given 08/30/18 0939)  hydrALAZINE (APRESOLINE) injection 10 mg (10 mg Intravenous Given 08/30/18 1300)    Mobility walks Low fall risk   Focused Assessments Cardiac Assessment Handoff:  Cardiac Rhythm: Normal sinus rhythm Lab Results  Component Value Date   CKTOTAL 125 04/02/2011   CKMB 2.1 04/02/2011   TROPONINI <0.30 05/05/2012   Lab Results  Component Value Date   DDIMER (H) 04/21/2009    3.96        AT THE INHOUSE ESTABLISHED CUTOFF VALUE OF 0.48 ug/mL FEU, THIS ASSAY HAS BEEN DOCUMENTED IN THE LITERATURE TO HAVE A SENSITIVITY AND NEGATIVE PREDICTIVE VALUE OF AT LEAST 98 TO 99%.  THE TEST RESULT SHOULD BE CORRELATED WITH AN ASSESSMENT OF THE CLINICAL PROBABILITY OF DVT / VTE.   Does the Patient currently have chest pain? No     R Recommendations: See Admitting Provider Note  Report given to:   Additional Notes:

## 2018-08-30 NOTE — ED Triage Notes (Addendum)
Pt POV d/t stabbing 5/10  L.CP that started around 1700 08/29/2018 and chronic gout flare to R. UE and BLE. Pt states this AM she woke up feeling dizzy & lightheadedness

## 2018-08-30 NOTE — Consult Note (Signed)
Reason for Consult: AKI/CKD stage 4  Referring Physician: Lorin Mercy, MD  Chelsea Roberts is an 63 y.o. female.  HPI: Chelsea Roberts has a PMH significant for longstanding, poorly controlled HTN, chronic systolic CHF, OSA not on CPAP, pre-diabetes, HLD, gout, and CKD stage 4 (presumably due to hypertensive nephrosclerosis) who presented to [facility] ED with complaints of light headedness, chest pain/tightness, joint pain, and weakness.  She also reported a fever of 102.4 a week ago but no cough, nasal congestion, HA, but has had some DOE.  She also reports generalized malaise.  In the ED she was noted to be hypertensive with SBP in the 200's and an elevated BUN/Cr of 71/7.49, CO2 of 16, and hgb of 7.7.  We were consulted to further evaluate her AKI/CKD stage 4.  Her last known Cr was 3.5 in November 2019 when she was evaluated by Dr. Joelyn Oms in our office, however she said that she lost her insurance and did not follow up despite being told of her advanced CKD.  She also has a sister on dialysis.    She denies any NSAIDs or COX-II I's.  She does report variable BP control and admits that when she has gout flares, they escalate.  She has been having multiple flares recently and was placed on steroids for her gout.  She denies any N/V/D, anorexia, dysgeusia, or lower extremity edema.  Trend in Creatinine:  Creatinine, Ser  Date/Time Value Ref Range Status  08/30/2018 09:49 AM 7.49 (H) 0.44 - 1.00 mg/dL Final  11/2017 3.5 (H)    08/19/2012 05:40 PM 1.57 (H) 0.50 - 1.10 mg/dL   05/29/2012 03:20 PM 1.69 (H) 0.50 - 1.10 mg/dL   05/05/2012 09:15 AM 1.36 (H) 0.50 - 1.10 mg/dL Final  12/31/2011 12:50 PM 1.52 (H) 0.50 - 1.10 mg/dL Final  04/02/2011 08:28 AM 1.22 (H) 0.50 - 1.10 mg/dL Final  04/01/2011 08:06 PM 1.28 (H) 0.50 - 1.10 mg/dL Final  04/01/2011 02:54 PM 1.27 (H) 0.50 - 1.10 mg/dL Final  05/16/2009 05:40 AM 1.68 (H) 0.4 - 1.2 mg/dL Final  05/15/2009 04:45 AM 1.79 (H) 0.4 - 1.2 mg/dL Final  05/14/2009  05:40 AM 1.88 (H) 0.4 - 1.2 mg/dL Final  05/13/2009 02:30 AM 1.59 (H) 0.4 - 1.2 mg/dL Final  05/12/2009 10:00 AM 1.61 (H) 0.4 - 1.2 mg/dL Final  04/26/2009 04:00 AM 1.67 (H) 0.4 - 1.2 mg/dL Final  04/25/2009 10:20 AM 1.69 (H) 0.4 - 1.2 mg/dL Final  04/24/2009 05:09 AM 1.91 (H) 0.4 - 1.2 mg/dL Final  04/23/2009 04:24 AM 1.89 (H) 0.4 - 1.2 mg/dL Final  04/22/2009 04:05 AM 1.80 (H) 0.4 - 1.2 mg/dL Final  04/21/2009 05:30 AM 2.36 (H) 0.4 - 1.2 mg/dL Final  04/20/2009 04:41 AM 2.47 (H) 0.4 - 1.2 mg/dL Final  04/19/2009 11:21 PM 2.40 (H) 0.4 - 1.2 mg/dL Final  04/19/2009 01:07 PM 2.47 (H) 0.4 - 1.2 mg/dL Final  02/27/2009 04:40 AM 1.11 0.4 - 1.2 mg/dL Final  02/26/2009 03:45 AM 1.14 0.4 - 1.2 mg/dL Final  02/25/2009 04:26 AM 0.92 0.4 - 1.2 mg/dL Final  02/24/2009 04:55 AM 1.02 0.4 - 1.2 mg/dL Final  02/23/2009 01:00 PM 1.38 (H) 0.4 - 1.2 mg/dL Final  02/20/2009 07:05 PM 1.7 (H) 0.4 - 1.2 mg/dL Final    PMH:   Past Medical History:  Diagnosis Date  . Anxiety   . Breast mass   . Chronic kidney disease   . Complication of anesthesia    severe htn-had to take  2 ativan preop  . Depression   . Gout   . Hyperlipidemia   . Hypertension   . Pre-diabetes   . Sleep apnea    NO CPAP use per pt., lost 70lbs  . Systolic CHF, chronic ([place]) 2013    PSH:   Past Surgical History:  Procedure Laterality Date  . ABDOMINAL HYSTERECTOMY  1999   partial  . BREAST BIOPSY  01/01/2012   Procedure: BREAST BIOPSY WITH NEEDLE LOCALIZATION;  Surgeon: Haywood Lasso, MD;  Location: [facility];  Service: General;  Laterality: Right;  Needle localization excision Right breast mass  . BREAST CYST EXCISION  12/2011  . BREAST LUMPECTOMY WITH RADIOACTIVE SEED LOCALIZATION Left 10/09/2017   Procedure: BREAST LUMPECTOMY WITH RADIOACTIVE SEED LOCALIZATION;  Surgeon: Jovita Kussmaul, MD;  Location: [facility];  Service: General;  Laterality: Left;    Allergies:  Allergies   Allergen Reactions  . Clonidine Derivatives     Extreme dry mouth  . Diovan [Valsartan]     headache  . Hctz [Hydrochlorothiazide]     gout  . Lisinopril Cough  . Norvasc [Amlodipine Besylate] Swelling  . Procardia [Nifedipine] Swelling  . Sulfa Antibiotics Swelling  . Tekturna [Aliskiren] Swelling    Medications:   Prior to Admission medications   Medication Sig Start Date End Date Taking? Authorizing Provider  carvedilol (COREG) 25 MG tablet Take 25 mg by mouth 2 (two) times daily with a meal.    Yes [provider]  hydrALAZINE (APRESOLINE) 50 MG tablet Take 100 mg by mouth 2 (two) times daily.    Yes [provider]  LORazepam (ATIVAN) 1 MG tablet Take 1 tablet (1 mg total) by mouth 2 (two) times daily. Patient taking differently: Take 1 mg by mouth daily.  01/25/12  Yes Wardell Honour, MD  Multiple Vitamin (MULITIVITAMIN WITH MINERALS) TABS Take 1 tablet by mouth daily.   Yes [provider]  sertraline (ZOLOFT) 100 MG tablet Take 1.5 tablets (150 mg total) by mouth daily. PATIENT NEEDS OFFICE VISIT FOR ADDITIONAL REFILLS Patient taking differently: Take 150 mg by mouth daily.  02/01/13  Yes Bailey Mech E, PA-C  colchicine 0.6 MG tablet Take 1 tablet (0.6 mg total) by mouth daily. Patient not taking: Reported on 08/30/2018 06/30/13   Leeanne Rio, MD  Febuxostat (ULORIC) 80 MG TABS Take 1 tablet (80 mg total) by mouth daily. Patient taking differently: Take 40 mg by mouth daily.  04/15/12   Darlyne Russian, MD  HYDROcodone-acetaminophen (NORCO/VICODIN) 5-325 MG tablet Take 1-2 tablets by mouth every 6 (six) hours as needed for moderate pain or severe pain. Patient not taking: Reported on 08/30/2018 10/09/17   Autumn Messing III, MD    Inpatient medications: . carvedilol  25 mg Oral BID WC  . [START ON 08/31/2018] febuxostat  40 mg Oral Daily  . heparin  5,000 Units Subcutaneous Q8H  . hydrALAZINE  100 mg Oral BID  . insulin aspart  0-9 Units  Subcutaneous TID WC  . [START ON 08/31/2018] LORazepam  1 mg Oral Daily  . predniSONE  40 mg Oral Q breakfast  . [START ON 08/31/2018] sertraline  150 mg Oral Daily  . sodium chloride flush  3 mL Intravenous Q12H    Discontinued Meds:   Medications Discontinued During This Encounter  Medication Reason  . amLODipine-olmesartan (AZOR) 10-40 MG per tablet Patient Preference  . LORazepam (ATIVAN) 1 MG tablet Duplicate  . colchicine tablet 0.6 mg  Patient has not taken in last 30 days  . dextrose 5 % 1,000 mL with sodium bicarbonate 150 mEq infusion Dose change    Social History:  reports that she has never smoked. She has never used smokeless tobacco. She reports that she does not drink alcohol or use drugs.  Family History:   Family History  Problem Relation Age of Onset  . Heart failure Sister   . Diabetes Sister   . Hypertension Sister   . Kidney disease Sister   . Hypertension Sister   . Diabetes Sister   . Colon polyps Sister   . Hypertension Mother   . Hypertension Sister   . Cancer Father        lung cancer; smoker and asbestos exposure  . Diabetes Father   . Cancer Maternal Aunt        ovarian  . Cancer Maternal Grandmother        breast  . Colon cancer Maternal Grandmother 68  . Diabetes type II Sister     Pertinent items are noted in HPI. Weight change:  No intake or output data in the 24 hours ending 08/30/18 1618 BP (!) 202/111 (BP Location: Right Arm)   Pulse 73   Temp (!) 97.5 F (36.4 C) (Oral)   Resp 20   Ht 5\' 6"  (1.676 m)   Wt 88.9 kg   SpO2 98%   BMI 31.63 kg/m  Vitals:   08/30/18 1200 08/30/18 1215 08/30/18 1524 08/30/18 1551  BP: (!) 215/111 (!) 215/96 (!) 132/110 (!) 202/111  Pulse: 68 70 73 73  Resp: 20 (!) 25 (!) 22 20  Temp:    (!) 97.5 F (36.4 C)  TempSrc:    Oral  SpO2: 96% 96% 99% 98%  Weight:    88.9 kg  Height:    5\' 6"  (1.676 m)     General appearance: alert, cooperative and no distress Head: Normocephalic, without obvious  abnormality, atraumatic Resp: clear to auscultation bilaterally Cardio: regular rate and rhythm, S1, S2 normal, no murmur, click, rub or gallop GI: soft, non-tender; bowel sounds normal; no masses,  no organomegaly Extremities: no edema, large tophi on bilateral great toes Neuro:  no asterixis  Labs: Basic Metabolic Panel: Recent Labs  Lab 08/30/18 0949 08/30/18 1249  NA 140  --   K 3.8  --   CL 109  --   CO2 16*  --   GLUCOSE 129*  --   BUN 71*  --   CREATININE 7.49*  --   CALCIUM 8.8*  --   PHOS  --  6.8*   Liver Function Tests: No results for input(s): AST, ALT, ALKPHOS, BILITOT, PROT, ALBUMIN in the last 168 hours. No results for input(s): LIPASE, AMYLASE in the last 168 hours. No results for input(s): AMMONIA in the last 168 hours. CBC: Recent Labs  Lab 08/30/18 0949  WBC 7.7  HGB 7.7*  HCT 25.7*  MCV 81.1  PLT 213   PT/INR: @LABRCNTIP (inr:5) Cardiac Enzymes: )No results for input(s): CKTOTAL, CKMB, CKMBINDEX, TROPONINI in the last 168 hours. CBG: No results for input(s): GLUCAP in the last 168 hours.  Iron Studies:  Recent Labs  Lab 08/30/18 1430  IRON 17*  TIBC 228*  FERRITIN 226    Xrays/Other Studies: Dg Chest Port 1 View  Result Date: 08/30/2018 CLINICAL DATA:  Shortness of breath EXAM: PORTABLE CHEST 1 VIEW COMPARISON:  Chest radiograph 05/05/2012 FINDINGS: Cardiomegaly. Uncoiled and atherosclerotic thoracic aorta. There is increased density which  projects over the medial left mediastinum, which was not well appreciated on prior exams. Shallow inspiration radiograph with crowding of the central bronchovascular markings. Mild ill-defined opacity at the left lung base with mild indistinctness of the left hemidiaphragm. No evidence of pleural effusion or pneumothorax. No acute bony abnormality IMPRESSION: Shallow inspiration radiograph. Cardiomegaly. Mild ill-defined opacity at the left lung base may reflect atelectasis. Pneumonia cannot be excluded.  Indeterminate increased density projecting over the medial left mediastinum, which was not well appreciated on prior exams. Recommend PA and lateral radiographs for further characterization when clinically feasible. Electronically Signed   By: Kellie Simmering   On: 08/30/2018 10:09     Assessment/Plan: 1.  AKI/CKD stage 4 vs progressive CKD to stage 5- pt has been lost to follow up twice at our office and each time she presented again with advanced disease.  Her workup in November revealed negative SPEP but she did not have further studies.   1. Will check FeNa and urine studies 2. Check renal US to evaluate size of kidneys and r/o obstruction 3. This likely represents progressive CKD and may be now at ESRD.  Will continue to educate and prepare for HD with vein mapping and VVS consult for access placement. 2. HTN- poorly controlled.  Agree with resuming outpatient medications and will increase hydralazine to three times a day.  Will consider adding clonidine if BP remains >180 3. Anemia of CKD - and also with low iron stores.  Will replete IV iron and will likely require ESA once her BP is under better control 4. Metabolic acidosis- due to #1.  Will start bicarb and follow 5. Pulmonary- mild opacity of the left lung base- await PA and lateral films.  covid-19 test pending  6. Secondary HPTH- elevated phos will check iPTH and start binders 7. Pre-diabetes- elevated hgb a1c at 5.7% 8. Chronic systolic CHF- euvolemic on exam 9. HLD- not on meds 10. Gout- currently on prednisone.   Governor Rooks Ltanya Bayley 08/30/2018, 4:18 PM

## 2018-08-30 NOTE — ED Provider Notes (Signed)
[facility] EMERGENCY DEPARTMENT Provider Note   CSN: [id number] Arrival date & time: 08/30/18  0846    History   Chief Complaint Chief Complaint  Patient presents with  . Chest Pain  . Gout    HPI Chelsea Roberts is a 63 y.o. female.     HPI  Patient presents via EMS for evaluation of multiple complaints.  She reports that 7 days ago she had a fever for 2 days, T-max of 102.4.  She also had left wrist pain and left hand swelling.  This resolved and then over the last several days she has had pain in her bilateral toes which she states is consistent with her gout flares.  She is also for the last 2 days had pain in her right wrist with swelling of the index and middle fingers and decreased strength, making it difficult for her to type at work on Friday.  She works at a call center on the computer.  She reports that due to the pain in her feet from her gout she has been sleeping on the couch for the last few days so she did not have to ambulate as far to the bathroom.  She woke up this morning and just felt generally unwell.  Began walking up the stairs and had some shortness of breath.  It is since resolved.  Denies chest pain.  Endorses sensation of tightness.  States she has not had this feeling in many months and thinks that last time it was due to her high blood pressure.  Past Medical History:  Diagnosis Date  . Anxiety   . Breast mass   . Chronic kidney disease   . Complication of anesthesia    severe htn-had to take 2 ativan preop  . Depression   . Gout   . Hyperlipidemia   . Hypertension   . Pre-diabetes   . Sleep apnea    NO CPAP use per pt., lost 70lbs  . Systolic CHF, chronic ([place]) 2013    Patient Active Problem List   Diagnosis Date Noted  . Acute renal failure superimposed on chronic kidney disease ([place]) 08/30/2018  . Prediabetes 05/31/2012  . Breast mass in female 03/20/2012  . Gout 11/05/2011  . Chest pain 04/03/2011  . HTN  (hypertension) 04/03/2011  . Hyperlipidemia 04/03/2011  . Chronic kidney disease 04/03/2011  . Systolic CHF, chronic ([place]) 04/03/2011  . Obstructive sleep apnea 04/03/2011  . Hypothyroidism 04/03/2011    Past Surgical History:  Procedure Laterality Date  . ABDOMINAL HYSTERECTOMY  1999   partial  . BREAST BIOPSY  01/01/2012   Procedure: BREAST BIOPSY WITH NEEDLE LOCALIZATION;  Surgeon: Haywood Lasso, MD;  Location: [facility];  Service: General;  Laterality: Right;  Needle localization excision Right breast mass  . BREAST CYST EXCISION  12/2011  . BREAST LUMPECTOMY WITH RADIOACTIVE SEED LOCALIZATION Left 10/09/2017   Procedure: BREAST LUMPECTOMY WITH RADIOACTIVE SEED LOCALIZATION;  Surgeon: Jovita Kussmaul, MD;  Location: [facility];  Service: General;  Laterality: Left;     OB History   No obstetric history on file.      Home Medications    Prior to Admission medications   Medication Sig Start Date End Date Taking? Authorizing Provider  carvedilol (COREG) 25 MG tablet Take 25 mg by mouth 2 (two) times daily with a meal.    Yes [provider]  hydrALAZINE (APRESOLINE) 50 MG tablet Take 100 mg by mouth 2 (  two) times daily.    Yes [provider]  LORazepam (ATIVAN) 1 MG tablet Take 1 tablet (1 mg total) by mouth 2 (two) times daily. Patient taking differently: Take 1 mg by mouth daily.  01/25/12  Yes Wardell Honour, MD  Multiple Vitamin (MULITIVITAMIN WITH MINERALS) TABS Take 1 tablet by mouth daily.   Yes [provider]  sertraline (ZOLOFT) 100 MG tablet Take 1.5 tablets (150 mg total) by mouth daily. PATIENT NEEDS OFFICE VISIT FOR ADDITIONAL REFILLS Patient taking differently: Take 150 mg by mouth daily.  02/01/13  Yes Bailey Mech E, PA-C  colchicine 0.6 MG tablet Take 1 tablet (0.6 mg total) by mouth daily. Patient not taking: Reported on 08/30/2018 06/30/13   Leeanne Rio, MD  Febuxostat (ULORIC) 80 MG TABS  Take 1 tablet (80 mg total) by mouth daily. Patient taking differently: Take 40 mg by mouth daily.  04/15/12   Darlyne Russian, MD  HYDROcodone-acetaminophen (NORCO/VICODIN) 5-325 MG tablet Take 1-2 tablets by mouth every 6 (six) hours as needed for moderate pain or severe pain. Patient not taking: Reported on 08/30/2018 10/09/17   Jovita Kussmaul, MD    Family History Family History  Problem Relation Age of Onset  . Heart failure Sister   . Diabetes Sister   . Hypertension Sister   . Kidney disease Sister   . Hypertension Sister   . Diabetes Sister   . Colon polyps Sister   . Hypertension Mother   . Hypertension Sister   . Cancer Father        lung cancer; smoker and asbestos exposure  . Diabetes Father   . Cancer Maternal Aunt        ovarian  . Cancer Maternal Grandmother        breast  . Colon cancer Maternal Grandmother 22  . Diabetes type II Sister     Social History Social History   Tobacco Use  . Smoking status: Never Smoker  . Smokeless tobacco: Never Used  Substance Use Topics  . Alcohol use: No  . Drug use: No     Allergies   Clonidine derivatives, Diovan [valsartan], Hctz [hydrochlorothiazide], Lisinopril, Norvasc [amlodipine besylate], Procardia [nifedipine], Sulfa antibiotics, and Tekturna [aliskiren]   Review of Systems Review of Systems  Constitutional: Negative for chills and fever.  HENT: Negative for ear pain and sore throat.   Eyes: Negative for pain and visual disturbance.  Respiratory: Positive for shortness of breath. Negative for cough.   Cardiovascular: Negative for chest pain and palpitations.  Gastrointestinal: Negative for abdominal pain and vomiting.  Genitourinary: Negative for dysuria and hematuria.  Musculoskeletal: Positive for arthralgias. Negative for back pain.  Skin: Negative for color change and rash.  Neurological: Positive for light-headedness. Negative for seizures and syncope.  Psychiatric/Behavioral: The patient is  nervous/anxious.   All other systems reviewed and are negative.    Physical Exam Updated Vital Signs BP (!) 215/96   Pulse 70   Temp 98.2 F (36.8 C) (Oral)   Resp (!) 25   Ht 5\' 6"  (1.676 m)   Wt 89.8 kg   SpO2 96%   BMI 31.96 kg/m   Physical Exam Vitals signs and nursing note reviewed.  Constitutional:      General: She is not in acute distress.    Appearance: She is well-developed.  HENT:     Head: Normocephalic and atraumatic.  Eyes:     Conjunctiva/sclera: Conjunctivae normal.  Neck:     Musculoskeletal: Neck  supple.  Cardiovascular:     Rate and Rhythm: Normal rate and regular rhythm.     Heart sounds: No murmur.  Pulmonary:     Effort: Pulmonary effort is normal. No respiratory distress.     Breath sounds: Normal breath sounds.  Abdominal:     Palpations: Abdomen is soft.     Tenderness: There is no abdominal tenderness.  Musculoskeletal:     Comments: Tophus on the right great toe, bunion on the left foot.  She has pain to palpation of the ventral aspect of the right wrist as well as flexion and extension  Skin:    General: Skin is warm and dry.  Neurological:     Mental Status: She is alert.      ED Treatments / Results  Labs (all labs ordered are listed, but only abnormal results are displayed) Labs Reviewed  BASIC METABOLIC PANEL - Abnormal; Notable for the following components:      Result Value   CO2 16 (*)    Glucose, Bld 129 (*)    BUN 71 (*)    Creatinine, Ser 7.49 (*)    Calcium 8.8 (*)    GFR calc non Af Amer 5 (*)    GFR calc Af Amer 6 (*)    All other components within normal limits  CBC - Abnormal; Notable for the following components:   RBC 3.17 (*)    Hemoglobin 7.7 (*)    HCT 25.7 (*)    MCH 24.3 (*)    All other components within normal limits  PHOSPHORUS - Abnormal; Notable for the following components:   Phosphorus 6.8 (*)    All other components within normal limits  TROPONIN I (HIGH SENSITIVITY) - Abnormal; Notable  for the following components:   Troponin I (High Sensitivity) 40 (*)    All other components within normal limits  TROPONIN I (HIGH SENSITIVITY) - Abnormal; Notable for the following components:   Troponin I (High Sensitivity) 42 (*)    All other components within normal limits  SARS CORONAVIRUS 2  MAGNESIUM  VITAMIN B12  FOLATE  IRON AND TIBC  FERRITIN  RETICULOCYTES  HIV ANTIBODY (ROUTINE TESTING W REFLEX)  HEMOGLOBIN A1C    EKG EKG Interpretation  Date/Time:  Monday August 30 2018 08:57:11 EDT Ventricular Rate:  68 PR Interval:    QRS Duration: 95 QT Interval:  462 QTC Calculation: 492 R Axis:   -12 Text Interpretation:  Sinus rhythm Left ventricular hypertrophy Borderline prolonged QT interval Confirmed by Lennice Sites [phone]) on 08/30/2018 9:02:43 AM   Radiology Dg Chest Port 1 View  Result Date: 08/30/2018 CLINICAL DATA:  Shortness of breath EXAM: PORTABLE CHEST 1 VIEW COMPARISON:  Chest radiograph 05/05/2012 FINDINGS: Cardiomegaly. Uncoiled and atherosclerotic thoracic aorta. There is increased density which projects over the medial left mediastinum, which was not well appreciated on prior exams. Shallow inspiration radiograph with crowding of the central bronchovascular markings. Mild ill-defined opacity at the left lung base with mild indistinctness of the left hemidiaphragm. No evidence of pleural effusion or pneumothorax. No acute bony abnormality IMPRESSION: Shallow inspiration radiograph. Cardiomegaly. Mild ill-defined opacity at the left lung base may reflect atelectasis. Pneumonia cannot be excluded. Indeterminate increased density projecting over the medial left mediastinum, which was not well appreciated on prior exams. Recommend PA and lateral radiographs for further characterization when clinically feasible. Electronically Signed   By: Kellie Simmering   On: 08/30/2018 10:09    Procedures Procedures (including critical care time)  Medications Ordered in ED  Medications  colchicine tablet 0.6 mg (has no administration in time range)  HYDROcodone-acetaminophen (NORCO/VICODIN) 5-325 MG per tablet 1-2 tablet (has no administration in time range)  carvedilol (COREG) tablet 25 mg (has no administration in time range)  hydrALAZINE (APRESOLINE) tablet 100 mg (has no administration in time range)  Febuxostat TABS 40 mg (has no administration in time range)  LORazepam (ATIVAN) tablet 1 mg (has no administration in time range)  sertraline (ZOLOFT) tablet 150 mg (has no administration in time range)  acetaminophen (TYLENOL) tablet 650 mg (has no administration in time range)    Or  acetaminophen (TYLENOL) suppository 650 mg (has no administration in time range)  zolpidem (AMBIEN) tablet 5 mg (has no administration in time range)  sorbitol 70 % solution 30 mL (has no administration in time range)  docusate sodium (ENEMEEZ) enema 283 mg (has no administration in time range)  ondansetron (ZOFRAN) tablet 4 mg (has no administration in time range)    Or  ondansetron (ZOFRAN) injection 4 mg (has no administration in time range)  camphor-menthol (SARNA) lotion 1 application (has no administration in time range)    And  hydrOXYzine (ATARAX/VISTARIL) tablet 25 mg (has no administration in time range)  calcium carbonate (dosed in mg elemental calcium) suspension 500 mg of elemental calcium (has no administration in time range)  feeding supplement (NEPRO CARB STEADY) liquid 237 mL (has no administration in time range)  dextrose 5 % 1,000 mL with sodium bicarbonate 150 mEq infusion (has no administration in time range)  heparin injection 5,000 Units (has no administration in time range)  sodium chloride flush (NS) 0.9 % injection 3 mL (has no administration in time range)  insulin aspart (novoLOG) injection 0-9 Units (has no administration in time range)  acetaminophen (TYLENOL) tablet 650 mg (650 mg Oral Given 08/30/18 0939)  hydrALAZINE (APRESOLINE) injection 10  mg (10 mg Intravenous Given 08/30/18 1300)     Initial Impression / Assessment and Plan / ED Course  I have reviewed the triage vital signs and the nursing notes.  Pertinent labs & imaging results that were available during my care of the patient were reviewed by me and considered in my medical decision making (see chart for details).        Ms. Steelman is a 63 year old female with a history of CKD and gout.  I reviewed her medical records.  We do not have updated labs regarding her creatinine.  We were able to have her most recent creatinine faxed from her nephrology office.  As of late last year her creatinine was 3.5.  Today it is 7.  She also has a new finding hemoglobin of 7.7 which is normocytic.  She denies melena.  Unclear cause.  Given her chest pain, acute on chronic kidney injury, and anemia, she was given 1 L bolus crystalloid.  Hospitalist consulted for admission the patient was subsequently admitted for continued inpatient care of her multiple problems.  Final Clinical Impressions(s) / ED Diagnoses   Final diagnoses:  Acute renal failure superimposed on chronic kidney disease, unspecified CKD stage, unspecified acute renal failure type ([place])  Hypertensive urgency  Anemia, unspecified type    ED Discharge Orders    None       Tillie Fantasia, MD 08/30/18 [place], [place], DO 08/30/18 1642

## 2018-08-30 NOTE — ED Notes (Signed)
Called [facility] to request records of labs.

## 2018-08-30 NOTE — ED Provider Notes (Signed)
I have personally seen and examined the patient. I have reviewed the documentation on PMH/FH/Soc Hx. I have discussed the plan of care with the resident and patient.  I have reviewed and agree with the resident's documentation. Please see associated encounter note.  Briefly, the patient is a 63 y.o. female here with history of gout, high cholesterol, anxiety who presents the ED with multiple complaints.  Patient with some pain in her wrists, feet on and off for the last several days.  Possibly some chest pain.  Overall story is not consistent with ACS.  EKG shows sinus rhythm.  No ischemic changes.  Does not appear to likely have gout clinically on exam.  Likely needs a rheumatology referral.  Possibly some rheumatoid arthritis.  Will evaluate chest pain with troponins.  Chest x-ray.  Does not have any shortness of breath.  Patient overall just has a general feeling of not feeling well.  Will evaluate with lab work.  Patient with elevated creatinine to 7.1, elevated Bun.  Hypertensive and given several doses of IV antihypertensives.  Electrolytes otherwise unremarkable.  Lab work obtained from outside nephrology group shows that this is a significant change in her creatinine.  Back in November it was 3.5.  Symptoms possibly consistent with worsening kidney function.  Troponin is mildly elevated however stable x2.  Likely hypertensive emergency causing her symptoms today.  Admitted to medicine service for further care.  Consulted nephrology and they will come evaluate the patient as well.  This chart was dictated using voice recognition software.  Despite best efforts to proofread,  errors can occur which can change the documentation meaning.   .Critical Care Performed by: Lennice Sites, DO Authorized by: Lennice Sites, DO   Critical care provider statement:    Critical care time (minutes):  45   Critical care was necessary to treat or prevent imminent or life-threatening deterioration of the  following conditions:  Cardiac failure and metabolic crisis   Critical care was time spent personally by me on the following activities:  Blood draw for specimens, development of treatment plan with patient or surrogate, discussions with consultants, discussions with primary provider, evaluation of patient's response to treatment, obtaining history from patient or surrogate, ordering and performing treatments and interventions, ordering and review of laboratory studies, pulse oximetry, ordering and review of radiographic studies, re-evaluation of patient's condition and review of old charts   I assumed direction of critical care for this patient from another provider in my specialty: no       EKG Interpretation  Date/Time:  Monday August 30 2018 08:57:11 EDT Ventricular Rate:  68 PR Interval:    QRS Duration: 95 QT Interval:  462 QTC Calculation: 492 R Axis:   -12 Text Interpretation:  Sinus rhythm Left ventricular hypertrophy Borderline prolonged QT interval Confirmed by Lennice Sites [phone]) on 08/30/2018 9:02:43 AM         Lennice Sites, DO 08/30/18 1647

## 2018-08-30 NOTE — H&P (Signed)
History and Physical    Chelsea Roberts:[id number] DOB: [birth year] DOA: 08/30/2018  PCP: Lucianne Lei, MD Consultants:  Rolinda Roan - nephrology Patient coming from:  Home - lives with husband; NOK: Husband  Chief Complaint:  Weakness  HPI: Chelsea Roberts is a 63 y.o. female with medical history significant of chronic systolic CHF; OSA not on CPAP; pre-diabetes; HTN; HLD; gout; and CKD presenting with weakness.  She reports repeated flares of gout - R toe, R foot, L hand, R hand - for several weeks.  When this happens, her BP and DM get worse.  Then she feels bad and gets dizzy.  Then she got chest pain which she thought was anxiety.  She has trouble getting out of a chair at times.  Increasing weakness, difficulty moving around more.  She last saw Dr. Joelyn Oms in November and he told her her kidneys were doing better - but she has been told in the past that she has ESRD.  She has never been talked to about need for HD or fistula.  Her sister is on HD after having had a failed transplant.      ED Course:  Vague complaints but overall didn't feel well - light-headed, weak.  Hard to pin down timeline of symptoms.  Has recurrent gout flares including one over the last week with fever last week.  Acute on chronic CKD and anemia. Has known CKD - last creatinine was 3.5 late last year.  Hypertensive in the ER, systolic 035D, took PO meds this AM.  Went back up, given hydralazine.  Call to nephrology pending.  Review of Systems: As per HPI; otherwise review of systems reviewed and negative.   Ambulatory Status:  Ambulates without assistance  Past Medical History:  Diagnosis Date  . Anxiety   . Breast mass   . Chronic kidney disease   . Complication of anesthesia    severe htn-had to take 2 ativan preop  . Depression   . Gout   . Hyperlipidemia   . Hypertension   . Pre-diabetes   . Sleep apnea    NO CPAP use per pt., lost 70lbs  . Systolic CHF, chronic ([place]) 2013    Past  Surgical History:  Procedure Laterality Date  . ABDOMINAL HYSTERECTOMY  1999   partial  . BREAST BIOPSY  01/01/2012   Procedure: BREAST BIOPSY WITH NEEDLE LOCALIZATION;  Surgeon: Haywood Lasso, MD;  Location: [facility];  Service: General;  Laterality: Right;  Needle localization excision Right breast mass  . BREAST CYST EXCISION  12/2011  . BREAST LUMPECTOMY WITH RADIOACTIVE SEED LOCALIZATION Left 10/09/2017   Procedure: BREAST LUMPECTOMY WITH RADIOACTIVE SEED LOCALIZATION;  Surgeon: Jovita Kussmaul, MD;  Location: [facility];  Service: General;  Laterality: Left;    Social History   Socioeconomic History  . Marital status: Single    Spouse name: n/a  . Number of children: 0  . Years of education: Not on file  . Highest education level: Not on file  Occupational History  . Occupation: Animator: Therapist, nutritional  . Occupation: Scientist, water quality: [employer]  Social Needs  . Financial resource strain: Not on file  . Food insecurity    Worry: Not on file    Inability: Not on file  . Transportation needs    Medical: Not on file    Non-medical: Not on file  Tobacco Use  .  Smoking status: Never Smoker  . Smokeless tobacco: Never Used  Substance and Sexual Activity  . Alcohol use: No  . Drug use: No  . Sexual activity: Not on file  Lifestyle  . Physical activity    Days per week: Not on file    Minutes per session: Not on file  . Stress: Not on file  Relationships  . Social Herbalist on phone: Not on file    Gets together: Not on file    Attends religious service: Not on file    Active member of club or organization: Not on file    Attends meetings of clubs or organizations: Not on file    Relationship status: Not on file  . Intimate partner violence    Fear of current or ex partner: Not on file    Emotionally abused: Not on file    Physically abused: Not on file    Forced sexual activity: Not on  file  Other Topics Concern  . Not on file  Social History Narrative   Marital:  Divorced; dating.     Children: none     Lives in [place], [place] alone.      Employment:  [employer] [place].      Tobacco: none     Allergies  Allergen Reactions  . Clonidine Derivatives     Extreme dry mouth  . Diovan [Valsartan]     headache  . Hctz [Hydrochlorothiazide]     gout  . Lisinopril Cough  . Norvasc [Amlodipine Besylate] Swelling  . Procardia [Nifedipine] Swelling  . Sulfa Antibiotics Swelling  . Tekturna [Aliskiren] Swelling    Family History  Problem Relation Age of Onset  . Heart failure Sister   . Diabetes Sister   . Hypertension Sister   . Kidney disease Sister   . Hypertension Sister   . Diabetes Sister   . Colon polyps Sister   . Hypertension Mother   . Hypertension Sister   . Cancer Father        lung cancer; smoker and asbestos exposure  . Diabetes Father   . Cancer Maternal Aunt        ovarian  . Cancer Maternal Grandmother        breast  . Colon cancer Maternal Grandmother 6  . Diabetes type II Sister     Prior to Admission medications   Medication Sig Start Date End Date Taking? Authorizing Provider  carvedilol (COREG) 25 MG tablet Take 25 mg by mouth 2 (two) times daily with a meal.    Yes [provider]  hydrALAZINE (APRESOLINE) 50 MG tablet Take 100 mg by mouth 2 (two) times daily.    Yes [provider]  LORazepam (ATIVAN) 1 MG tablet Take 1 tablet (1 mg total) by mouth 2 (two) times daily. Patient taking differently: Take 1 mg by mouth daily.  01/25/12  Yes Wardell Honour, MD  Multiple Vitamin (MULITIVITAMIN WITH MINERALS) TABS Take 1 tablet by mouth daily.   Yes [provider]  sertraline (ZOLOFT) 100 MG tablet Take 1.5 tablets (150 mg total) by mouth daily. PATIENT NEEDS OFFICE VISIT FOR ADDITIONAL REFILLS Patient taking differently: Take 150 mg by mouth daily.  02/01/13  Yes Bailey Mech E, PA-C   colchicine 0.6 MG tablet Take 1 tablet (0.6 mg total) by mouth daily. Patient not taking: Reported on 08/30/2018 06/30/13   Leeanne Rio, MD  Febuxostat (ULORIC) 80 MG TABS Take 1 tablet (  80 mg total) by mouth daily. Patient taking differently: Take 40 mg by mouth daily.  04/15/12   Darlyne Russian, MD  HYDROcodone-acetaminophen (NORCO/VICODIN) 5-325 MG tablet Take 1-2 tablets by mouth every 6 (six) hours as needed for moderate pain or severe pain. Patient not taking: Reported on 08/30/2018 10/09/17   Jovita Kussmaul, MD    Physical Exam: Vitals:   08/30/18 1130 08/30/18 1145 08/30/18 1200 08/30/18 1215  BP: (!) 201/157 (!) 188/174 (!) 215/111 (!) 215/96  Pulse: 68 79 68 70  Resp: (!) 25 (!) 23 20 (!) 25  Temp:      TempSrc:      SpO2: 94% 96% 96% 96%  Weight:      Height:         . General:  Appears calm and comfortable and is NAD . Eyes:  EOMI, normal lids, iris . ENT:  grossly normal hearing, lips & tongue, mmm . Neck:  no LAD, masses or thyromegaly . Cardiovascular:  RRR, no m/r/g. No LE edema.  Marland Kitchen Respiratory:   CTA bilaterally with no wheezes/rales/rhonchi.  Normal to mildly increased respiratory effort. . Abdomen:  soft, NT, ND, NABS . Skin:  Large tophus on R great toe that appears inflamed     . Musculoskeletal:  grossly normal tone BUE/BLE, good ROM, no bony abnormality . Lower extremity:  No LE edema.  Limited foot exam with no ulcerations, tophus as above.  2+ distal pulses. Marland Kitchen Psychiatric:  blunted mood and affect, speech fluent and appropriate, AOx3 . Neurologic:  CN 2-12 grossly intact, moves all extremities in coordinated fashion, sensation intact    Radiological Exams on Admission: Dg Chest Port 1 View  Result Date: 08/30/2018 CLINICAL DATA:  Shortness of breath EXAM: PORTABLE CHEST 1 VIEW COMPARISON:  Chest radiograph 05/05/2012 FINDINGS: Cardiomegaly. Uncoiled and atherosclerotic thoracic aorta. There is increased density which projects over the medial  left mediastinum, which was not well appreciated on prior exams. Shallow inspiration radiograph with crowding of the central bronchovascular markings. Mild ill-defined opacity at the left lung base with mild indistinctness of the left hemidiaphragm. No evidence of pleural effusion or pneumothorax. No acute bony abnormality IMPRESSION: Shallow inspiration radiograph. Cardiomegaly. Mild ill-defined opacity at the left lung base may reflect atelectasis. Pneumonia cannot be excluded. Indeterminate increased density projecting over the medial left mediastinum, which was not well appreciated on prior exams. Recommend PA and lateral radiographs for further characterization when clinically feasible. Electronically Signed   By: Kellie Simmering   On: 08/30/2018 10:09    EKG: Independently reviewed.  NSR with rate 68; LVH; borderline prolonged QT 492; no evidence of acute ischemia   Labs on Admission: I have personally reviewed the available labs and imaging studies at the time of the admission.  Pertinent labs:   CO2 16 Glucose 129 BUN 71/Creatinine 7.49/GFR 6; 28/1.57 in 2014 HS troponin 40 WBC 7.7 Hgb 7.7   Assessment/Plan Principal Problem:   Acute renal failure superimposed on chronic kidney disease ([place]) Active Problems:   HTN (hypertension)   Hyperlipidemia   Systolic CHF, chronic (H[facility])   Obstructive sleep apnea   Hypothyroidism   Gout   Prediabetes   Renal dysfunction -Patient with prior report of ESRD but not on HD and recent records not currently available presenting with renal dysfunction -While there may be an acute component, it appears that the patient is approaching ESRD and need for HD -She does not have access -Her weakness and generalized fatigue appear likely related  to this issue -May need temporary HD catheter for initiation of HD -May need vascular access mapping for fistula -Management as per nephrology -For now, I have added bicarbonate infusion to attempt to correct  her acidosis (CO2 18)  Gout -Patient with painful tophaceous gout -Will give prednisone 40 mg daily for now and continue/resume Uloric and Colchicine -This appears likely related to inadequate clearance of uric acid associated with progressive renal failure and is most likely to improve with HD -Continue Vicodin  HTN -Continue Coreg, hydralazine  HLD -She does not appear to be taking medication for this issue -Prior LDL in 2014 was 191 and she likely does require medication -Will order lipid panel for further evaluation  Systolic CHF, chronic -She has mild tachypnea but does not appear to be volume overloaded despite renal dysfunction -CXR is not completely normal, with "Indeterminate increased density projecting over the medial left mediastinum, which was not well appreciated on prior exams." -Will order PA/lateral CXR  Hypothyroidism -Reported on PMH but normal thyroid testing since 2014 in [place] -Will order TSH at this time -She does not appear to be taking medication for this issue  Prediabetes -Her last A1c in 2014 was 6.3 -Glucose 129 today -Will cover with sensitive-scale SSI, particularly since the patient is being started on steroids -Will check A1c   Note: This patient has been tested and is pending for the novel coronavirus COVID-19.  DVT prophylaxis: Heparin Code Status:  Full - confirmed with patient/family Family Communication: Husband was present throughout evaluation  Disposition Plan:  Home once clinically improved Consults called: Nephrology  Admission status: Admit - It is my clinical opinion that admission to INPATIENT is reasonable and necessary because of the expectation that this patient will require hospital care that crosses at least 2 midnights to treat this condition based on the medical complexity of the problems presented.  Given the aforementioned information, the predictability of an adverse outcome is felt to be significant.    Karmen Bongo  MD [facility]   How to contact the [facility] Attending or Consulting provider [place] or covering provider during after hours [place], for this patient?  1. Check the care team in [facility] and look for a) attending/consulting [facility] provider listed and b) the [facility] team listed 2. Log into [email or web address] and use [place] universal password to access. If you do not have the password, please contact the hospital operator. 3. Locate the [facility] provider you are looking for under [facility] and page to a number that you can be directly reached. 4. If you still have difficulty reaching the provider, please page the [facility] (Director on Call) for the Hospitalists listed on amion for assistance.   08/30/2018, 2:13 PM

## 2018-08-31 ENCOUNTER — Inpatient Hospital Stay (HOSPITAL_COMMUNITY): Payer: Medicaid Other

## 2018-08-31 DIAGNOSIS — M10019 Idiopathic gout, unspecified shoulder: Secondary | ICD-10-CM

## 2018-08-31 DIAGNOSIS — E782 Mixed hyperlipidemia: Secondary | ICD-10-CM

## 2018-08-31 DIAGNOSIS — E039 Hypothyroidism, unspecified: Secondary | ICD-10-CM

## 2018-08-31 DIAGNOSIS — N170 Acute kidney failure with tubular necrosis: Secondary | ICD-10-CM

## 2018-08-31 DIAGNOSIS — N184 Chronic kidney disease, stage 4 (severe): Secondary | ICD-10-CM

## 2018-08-31 DIAGNOSIS — N185 Chronic kidney disease, stage 5: Secondary | ICD-10-CM

## 2018-08-31 DIAGNOSIS — I10 Essential (primary) hypertension: Secondary | ICD-10-CM

## 2018-08-31 DIAGNOSIS — I5022 Chronic systolic (congestive) heart failure: Secondary | ICD-10-CM

## 2018-08-31 DIAGNOSIS — N186 End stage renal disease: Secondary | ICD-10-CM

## 2018-08-31 LAB — HIV ANTIBODY (ROUTINE TESTING W REFLEX): HIV Screen 4th Generation wRfx: NONREACTIVE

## 2018-08-31 LAB — CBC
HCT: 28.2 % — ABNORMAL LOW (ref 36.0–46.0)
Hemoglobin: 8.7 g/dL — ABNORMAL LOW (ref 12.0–15.0)
MCH: 24.4 pg — ABNORMAL LOW (ref 26.0–34.0)
MCHC: 30.9 g/dL (ref 30.0–36.0)
MCV: 79.2 fL — ABNORMAL LOW (ref 80.0–100.0)
Platelets: 289 10*3/uL (ref 150–400)
RBC: 3.56 MIL/uL — ABNORMAL LOW (ref 3.87–5.11)
RDW: 15.4 % (ref 11.5–15.5)
WBC: 6.5 10*3/uL (ref 4.0–10.5)
nRBC: 0 % (ref 0.0–0.2)

## 2018-08-31 LAB — RENAL FUNCTION PANEL
Albumin: 2.8 g/dL — ABNORMAL LOW (ref 3.5–5.0)
Anion gap: 18 — ABNORMAL HIGH (ref 5–15)
BUN: 74 mg/dL — ABNORMAL HIGH (ref 8–23)
CO2: 14 mmol/L — ABNORMAL LOW (ref 22–32)
Calcium: 9 mg/dL (ref 8.9–10.3)
Chloride: 107 mmol/L (ref 98–111)
Creatinine, Ser: 7.4 mg/dL — ABNORMAL HIGH (ref 0.44–1.00)
GFR calc Af Amer: 6 mL/min — ABNORMAL LOW (ref >60)
GFR calc non Af Amer: 5 mL/min — ABNORMAL LOW (ref >60)
Glucose, Bld: 155 mg/dL — ABNORMAL HIGH (ref 70–99)
Phosphorus: 5.4 mg/dL — ABNORMAL HIGH (ref 2.5–4.6)
Potassium: 3.5 mmol/L (ref 3.5–5.1)
Sodium: 139 mmol/L (ref 135–145)

## 2018-08-31 LAB — GLUCOSE, CAPILLARY
Glucose-Capillary: 125 mg/dL — ABNORMAL HIGH (ref 70–99)
Glucose-Capillary: 144 mg/dL — ABNORMAL HIGH (ref 70–99)
Glucose-Capillary: 167 mg/dL — ABNORMAL HIGH (ref 70–99)
Glucose-Capillary: 220 mg/dL — ABNORMAL HIGH (ref 70–99)

## 2018-08-31 LAB — LIPID PANEL
Cholesterol: 222 mg/dL — ABNORMAL HIGH (ref 0–200)
HDL: 33 mg/dL — ABNORMAL LOW (ref >40)
LDL Cholesterol: 156 mg/dL — ABNORMAL HIGH (ref 0–99)
Total CHOL/HDL Ratio: 6.7 RATIO
Triglycerides: 164 mg/dL — ABNORMAL HIGH (ref <150)
VLDL: 33 mg/dL (ref 0–40)

## 2018-08-31 MED ORDER — LABETALOL HCL 200 MG PO TABS: 400.0000 mg | ORAL_TABLET | Freq: Two times a day (BID) | ORAL | Status: DC

## 2018-08-31 MED ORDER — LABETALOL HCL 5 MG/ML IV SOLN
10.0000 mg | INTRAVENOUS | Status: DC | PRN
  Administered 2018-08-31 – 2018-09-06 (×5): 10 mg via INTRAVENOUS
  Filled 2018-08-31 (×6): qty 4

## 2018-08-31 MED ORDER — LABETALOL HCL 200 MG PO TABS
400.0000 mg | ORAL_TABLET | Freq: Three times a day (TID) | ORAL | Status: DC
  Administered 2018-08-31 – 2018-09-05 (×14): 400 mg via ORAL
  Filled 2018-08-31 (×14): qty 2

## 2018-08-31 MED ORDER — FUROSEMIDE 10 MG/ML IJ SOLN
40.0000 mg | Freq: Once | INTRAMUSCULAR | Status: AC
  Administered 2018-08-31: 40 mg via INTRAVENOUS
  Filled 2018-08-31: qty 4

## 2018-08-31 MED ORDER — LABETALOL HCL 200 MG PO TABS
200.0000 mg | ORAL_TABLET | Freq: Two times a day (BID) | ORAL | Status: DC
  Administered 2018-08-31: 200 mg via ORAL
  Filled 2018-08-31: qty 1

## 2018-08-31 MED ORDER — LORAZEPAM 1 MG PO TABS
1.0000 mg | ORAL_TABLET | Freq: Every day | ORAL | Status: DC
  Administered 2018-09-01 – 2018-09-05 (×5): 1 mg via ORAL
  Filled 2018-08-31 (×5): qty 1

## 2018-08-31 NOTE — Progress Notes (Signed)
Received call from Dr. Lunette Stands. Verbal order received 40 mg IV lasix to be administered and Labetalol to be administered TID. Will administer and monitor patient's response.  Hiram Comber, RN 08/31/2018 6:18 PM

## 2018-08-31 NOTE — Progress Notes (Signed)
   08/31/18 0829  Vitals  BP (!) 189/126  MAP (mmHg) 146   Scheduled Coreg administered. Will re-assess in one hour.

## 2018-08-31 NOTE — Progress Notes (Signed)
Patient's blood pressure remains elevated: 197/104. Dr. Lunette Stands paged again. Awaiting response/orders.   Hiram Comber, RN 08/31/2018 6:00 PM

## 2018-08-31 NOTE — H&P (View-Only) (Signed)
Hospital Consult    Reason for Consult:  In need of dialysis access Requesting PhysicianMarval Regal  MRN #:  [id number]  History of Present Illness: This is a 63 y.o. female who has hx of poorly controlled HTN, CHF, OSA but not on CPAP, hyperlipidemia, and gout.  She has hx of CKD 4 and had been lost to f/u with [facility].  She states she has hx of mini stroke when she was in hospital sometime around 2013.  She does not know what sx she had as she says they told her she did have a stroke.  She has not had any sx of stroke since then.  She states she is having a flare of gout on her right great toe and difficulty squeezing her hands due to gout.    Her sister is on HD via [facility] after failed transplant that she had for many years.    She presented to hospital yesterday with c/o light headedness, dizziness and weakness.  States she just hasn't felt good and has gotten worse.  She has hx of 102 fever about a week ago. She has had some sob with exertion.  She was hypertensive with systolic in 222'L with increased renal function in the ER.    [facility] is consulted for permanent dialysis access.  Per their note, they are going to hold on the [facility] at this time as she is reluctant to start HD and is without uremic sx.  BUE vein mapping has been ordered.   The pt is not on a statin for cholesterol management.  The pt is not on a daily aspirin.   Other AC:  Heparin sq The pt is on BB for hypertension.   The pt is pre diabetic.  On SSI here Tobacco hx:  never  Past Medical History:  Diagnosis Date  . Anxiety   . Breast mass   . Chronic kidney disease   . Complication of anesthesia    severe htn-had to take 2 ativan preop  . Depression   . Gout   . Hyperlipidemia   . Hypertension   . Pre-diabetes   . Sleep apnea    NO CPAP use per pt., lost 70lbs  . Systolic CHF, chronic ([place]) 2013    Past Surgical History:  Procedure Laterality Date  . ABDOMINAL HYSTERECTOMY  1999   partial  . BREAST BIOPSY   01/01/2012   Procedure: BREAST BIOPSY WITH NEEDLE LOCALIZATION;  Surgeon: Haywood Lasso, MD;  Location: [place];  Service: General;  Laterality: Right;  Needle localization excision Right breast mass  . BREAST CYST EXCISION  12/2011  . BREAST LUMPECTOMY WITH RADIOACTIVE SEED LOCALIZATION Left 10/09/2017   Procedure: BREAST LUMPECTOMY WITH RADIOACTIVE SEED LOCALIZATION;  Surgeon: Jovita Kussmaul, MD;  Location: [place];  Service: General;  Laterality: Left;    Allergies  Allergen Reactions  . Clonidine Derivatives     Extreme dry mouth  . Diovan [Valsartan]     headache  . Hctz [Hydrochlorothiazide]     gout  . Lisinopril Cough  . Norvasc [Amlodipine Besylate] Swelling  . Procardia [Nifedipine] Swelling  . Sulfa Antibiotics Swelling  . Tekturna [Aliskiren] Swelling    Prior to Admission medications   Medication Sig Start Date End Date Taking? Authorizing Provider  carvedilol (COREG) 25 MG tablet Take 25 mg by mouth 2 (two) times daily with a meal.    Yes [provider]  hydrALAZINE (APRESOLINE) 50 MG tablet Take  100 mg by mouth 2 (two) times daily.    Yes [provider]  LORazepam (ATIVAN) 1 MG tablet Take 1 tablet (1 mg total) by mouth 2 (two) times daily. Patient taking differently: Take 1 mg by mouth daily.  01/25/12  Yes Wardell Honour, MD  Multiple Vitamin (MULITIVITAMIN WITH MINERALS) TABS Take 1 tablet by mouth daily.   Yes [provider]  sertraline (ZOLOFT) 100 MG tablet Take 1.5 tablets (150 mg total) by mouth daily. PATIENT NEEDS OFFICE VISIT FOR ADDITIONAL REFILLS Patient taking differently: Take 150 mg by mouth daily.  02/01/13  Yes Bailey Mech E, PA-C  colchicine 0.6 MG tablet Take 1 tablet (0.6 mg total) by mouth daily. Patient not taking: Reported on 08/30/2018 06/30/13   Leeanne Rio, MD  Febuxostat (ULORIC) 80 MG TABS Take 1 tablet (80 mg total) by mouth daily. Patient taking differently:  Take 40 mg by mouth daily.  04/15/12   Darlyne Russian, MD  HYDROcodone-acetaminophen (NORCO/VICODIN) 5-325 MG tablet Take 1-2 tablets by mouth every 6 (six) hours as needed for moderate pain or severe pain. Patient not taking: Reported on 08/30/2018 10/09/17   Jovita Kussmaul, MD    Social History   Socioeconomic History  . Marital status: Single    Spouse name: n/a  . Number of children: 0  . Years of education: Not on file  . Highest education level: Not on file  Occupational History  . Occupation: Animator: Therapist, nutritional  . Occupation: Scientist, water quality: [employer]  Social Needs  . Financial resource strain: Not on file  . Food insecurity    Worry: Not on file    Inability: Not on file  . Transportation needs    Medical: Not on file    Non-medical: Not on file  Tobacco Use  . Smoking status: Never Smoker  . Smokeless tobacco: Never Used  Substance and Sexual Activity  . Alcohol use: No  . Drug use: No  . Sexual activity: Not on file  Lifestyle  . Physical activity    Days per week: Not on file    Minutes per session: Not on file  . Stress: Not on file  Relationships  . Social Herbalist on phone: Not on file    Gets together: Not on file    Attends religious service: Not on file    Active member of club or organization: Not on file    Attends meetings of clubs or organizations: Not on file    Relationship status: Not on file  . Intimate partner violence    Fear of current or ex partner: Not on file    Emotionally abused: Not on file    Physically abused: Not on file    Forced sexual activity: Not on file  Other Topics Concern  . Not on file  Social History Narrative   Marital:  Divorced; dating.     Children: none     Lives in [place], [place] alone.      Employment:  [employer] [place].      Tobacco: none      Family History  Problem Relation Age of Onset  . Heart failure Sister   . Diabetes  Sister   . Hypertension Sister   . Kidney disease Sister   . Hypertension Sister   . Diabetes Sister   . Colon polyps Sister   . Hypertension  Mother   . Hypertension Sister   . Cancer Father        lung cancer; smoker and asbestos exposure  . Diabetes Father   . Cancer Maternal Aunt        ovarian  . Cancer Maternal Grandmother        breast  . Colon cancer Maternal Grandmother 32  . Diabetes type II Sister     ROS: [x]  Positive   [ ]  Negative   [ ]  All sytems reviewed and are negative  Cardiac: [x]  high blood pressure [x]  increased cholesterol [x]  DOE  Vascular: Denies claudication pain  Pulmonary: [x]  sleep apnea  Neurologic: []  hx of CVA [x]  mini stroke [x]  light headedness  Hematologic: []  hx of cancer  Endocrine:   [x]  pre diabetes   GI []  vomiting blood []  blood in stool  GU: [x]  CKD/renal failure []  HD--[]  M/W/F or []  T/T/S  Psychiatric: [x]  anxiety [x]  depression  Musculoskeletal: [x]  gout  Integumentary: []  rashes []  ulcers  Constitutional: [x]  fever   Physical Examination  Vitals:   08/31/18 0951 08/31/18 1235  BP: (!) 175/106 (!) 188/102  Pulse: 81 77  Resp:    Temp:    SpO2:     Body mass index is 31.63 kg/m.  General:  WDWN in NAD Gait: Not observed HENT: WNL, normocephalic Pulmonary: normal non-labored breathing, without Rales, rhonchi,  wheezing Cardiac: regular, without  Murmurs, rubs or gallops; without carotid bruits Abdomen:  soft, NT/ND, no masses Skin: without rashes Vascular Exam/Pulses:  Right Left  Radial 2+ (normal) 2+ (normal)  Ulnar 1+ (weak) 1+ (weak)  DP 2+ (normal) 2+ (normal)  PT 2+ (normal) 2+ (normal)   Extremities: without ischemic changes, without Gangrene , without cellulitis; without open wounds; gout flare on the right great toe. Musculoskeletal: no muscle wasting or atrophy  Neurologic: A&O X 3;  No focal weakness or paresthesias are detected; speech is fluent/normal Psychiatric:  The  pt has Normal affect.   CBC    Component Value Date/Time   WBC 6.5 08/31/2018 0229   RBC 3.56 (L) 08/31/2018 0229   HGB 8.7 (L) 08/31/2018 0229   HCT 28.2 (L) 08/31/2018 0229   PLT 289 08/31/2018 0229   MCV 79.2 (L) 08/31/2018 0229   MCV 84.9 07/02/2012 1617   MCH 24.4 (L) 08/31/2018 0229   MCHC 30.9 08/31/2018 0229   RDW 15.4 08/31/2018 0229   LYMPHSABS 2.3 05/05/2012 0915   MONOABS 0.6 05/05/2012 0915   EOSABS 0.0 05/05/2012 0915   BASOSABS 0.0 05/05/2012 0915    BMET    Component Value Date/Time   NA 139 08/31/2018 0229   K 3.5 08/31/2018 0229   CL 107 08/31/2018 0229   CO2 14 (L) 08/31/2018 0229   GLUCOSE 155 (H) 08/31/2018 0229   BUN 74 (H) 08/31/2018 0229   CREATININE 7.40 (H) 08/31/2018 0229   CREATININE 1.57 (H) 08/19/2012 1740   CALCIUM 9.0 08/31/2018 0229   GFRNONAA 5 (L) 08/31/2018 0229   GFRAA 6 (L) 08/31/2018 0229    COAGS: Lab Results  Component Value Date   INR 1.28 04/20/2009     Non-Invasive Vascular Imaging:   BUE vein mapping ordered and pending   ASSESSMENT/PLAN: This is a 63 y.o. female AKI on CKD 4/5 now in need of permanent dialysis access and she is right hand dominant.  -discussed options of permanent dialysis access-will need vein mapping to determine best access for pt.  She states that she would prefer  a graft as her sister dialyzes via a graft after failed transplant.  I explained to her that if we can get a good fistula working, it would be better for her.  She will discuss with on call MD, who will see pt later today or tomorrow.  -she does have an IV in the left arm.  Given she is right hand dominant, I discussed with her that we should move this IV to the right arm and she should not let them stick needles or take blood pressure in the left arm -per renal, Will hold off on [facility] for now as she is reluctant to start HD at this time and is without uremic symptoms.   Leontine Locket, PA-C [facility] [phone]     I agree with the above.  I have seen and examined the patient.  Her husband was present for our discussions.  We discussed a left arm access.  If she has an adequate vein, a fistula would be placed, if not, a graft would be placed.  She is apprehensive about dialysis.  She is agreeable with surgery tomorrow.  I wouldn't be surprised if she changes her mind.  The details of the surgery were discussed including but not limited to steal, the need for additional operations, non-maturity.  She will be NPO after midnight.  We discussed that one o f my partners may do her case.   Annamarie Major

## 2018-08-31 NOTE — Progress Notes (Signed)
PROGRESS NOTE    Chelsea Roberts  XBW:[id number] DOB: 08/25/1955 DOA: 08/30/2018 PCP: Lucianne Lei, MD   Brief Narrative: 63 year old female with a past medical history significant for poorly controlled hypertension, chronic systolic congestive heart failure, obstructive sleep apnea not on CPAP, gout, chronic kidney disease with a baseline creatinine of 3.51-year back presented to the emergency department with complaints of multiple flareups of gout in multiple joints over the past several weeks.  Patient was experiencing poorly, dizzy associated with the chest pain, having difficulty getting out of the chair secondary to generalized weakness came to the emergency department.  Work-up in the emergency department was found to have systolic blood pressure in 200s and of 7.5, gouty arthritis in multiple joints.  Nephrology is consulted, vascular surgery is consulted for AV graft placement.  ##Acute on chronic renal failure -Secondary to poorly controlled blood pressure with hypertensive nephropathy -Nephrology is consulted -Ultrasound of the kidneys showed a significant decrease in the size compared to 2011 -Consulted vascular surgery for possible graft placement  ##Hypertension accelerated -Continue with hydralazine, increased labetalol 400 mg twice daily -Keep the patient on as needed hydralazine, labetalol -Patient is allergic to calcium channel blockers dihydropyridines  ##Anemia of chronic kidney disease -Give IV iron, Epogen  ##Gouty arthritis -Continue the prednisone  ##Prediabetes -Hemoglobin A1c is 5.7  ##Chronic systolic congestive heart failure -Keep the patient on IV Lasix Assessment & Plan:   Principal Problem:   Acute renal failure superimposed on chronic kidney disease ([place]) Active Problems:   HTN (hypertension)   Hyperlipidemia   Systolic CHF, chronic (HCC)   Obstructive sleep apnea   Hypothyroidism   Gout   Prediabetes    DVT prophylaxis: Heparin Code  Status: Full code  family Communication: Patient, husband  disposition Plan: Home   Consultants:   Nephrology  Vascular surgery  Procedures:   Subjective: Improved pain in the joints  Objective: Vitals:   08/31/18 0829 08/31/18 0951 08/31/18 1235 08/31/18 1557  BP: (!) 189/126 (!) 175/106 (!) 188/102 (!) 201/100  Pulse: (!) 109 81 77 78  Resp:    18  Temp:    98.1 F (36.7 C)  TempSrc:      SpO2:    97%  Weight:      Height:        Intake/Output Summary (Last 24 hours) at 08/31/2018 1627 Last data filed at 08/31/2018 0953 Gross per 24 hour  Intake 164.6 ml  Output --  Net 164.6 ml   Filed Weights   08/30/18 0856 08/30/18 1551  Weight: 89.8 kg 88.9 kg    Examination:  General exam: Appears calm and comfortable  Respiratory system: Clear to auscultation. Respiratory effort normal. Cardiovascular system: S1 & S2 heard, RRR. No JVD, murmurs, rubs, gallops or clicks. No pedal edema. Gastrointestinal system: Abdomen is nondistended, soft and nontender. No organomegaly or masses felt. Normal bowel sounds heard. Central nervous system: Alert and oriented. No focal neurological deficits. Extremities: Symmetric 5 x 5 power. Skin: No rashes, lesions or ulcers Psychiatry: Judgement and insight appear normal. Mood & affect appropriate.     Data Reviewed: I have personally reviewed following labs and imaging studies  CBC: Recent Labs  Lab 08/30/18 0949 08/31/18 0229  WBC 7.7 6.5  HGB 7.7* 8.7*  HCT 25.7* 28.2*  MCV 81.1 79.2*  PLT 213 163   Basic Metabolic Panel: Recent Labs  Lab 08/30/18 0949 08/30/18 1249 08/31/18 0229  NA 140  --  139  K 3.8  --  3.5  CL 109  --  107  CO2 16*  --  14*  GLUCOSE 129*  --  155*  BUN 71*  --  74*  CREATININE 7.49*  --  7.40*  CALCIUM 8.8*  --  9.0  MG  --  2.3  --   PHOS  --  6.8* 5.4*   GFR: Estimated Creatinine Clearance: 9 mL/min (A) (by C-G formula based on SCr of 7.4 mg/dL (H)). Liver Function Tests: Recent  Labs  Lab 08/31/18 0229  ALBUMIN 2.8*   No results for input(s): LIPASE, AMYLASE in the last 168 hours. No results for input(s): AMMONIA in the last 168 hours. Coagulation Profile: No results for input(s): INR, PROTIME in the last 168 hours. Cardiac Enzymes: No results for input(s): CKTOTAL, CKMB, CKMBINDEX, TROPONINI in the last 168 hours. BNP (last 3 results) No results for input(s): PROBNP in the last 8760 hours. HbA1C: Recent Labs    08/30/18 1430  HGBA1C 5.7*   CBG: Recent Labs  Lab 08/30/18 1652 08/30/18 2223 08/31/18 0801 08/31/18 1233  GLUCAP 89 178* 125* 144*   Lipid Profile: Recent Labs    08/31/18 0229  CHOL 222*  HDL 33*  LDLCALC 156*  TRIG 164*  CHOLHDL 6.7   Thyroid Function Tests: Recent Labs    08/30/18 1430  TSH 2.084   Anemia Panel: Recent Labs    08/30/18 1430  VITAMINB12 594  FOLATE 50.4  FERRITIN 226  TIBC 228*  IRON 17*  RETICCTPCT 1.1   Sepsis Labs: No results for input(s): PROCALCITON, LATICACIDVEN in the last 168 hours.  Recent Results (from the past 240 hour(s))  SARS CORONAVIRUS 2 Nasal Swab Aptima Multi Swab     Status: None   Collection Time: 08/30/18  1:03 PM   Specimen: Aptima Multi Swab; Nasal Swab  Result Value Ref Range Status   SARS Coronavirus 2 NEGATIVE NEGATIVE Final    Comment: (NOTE) SARS-CoV-2 target nucleic acids are NOT DETECTED. The SARS-CoV-2 RNA is generally detectable in upper and lower respiratory specimens during the acute phase of infection. Negative results do not preclude SARS-CoV-2 infection, do not rule out co-infections with other pathogens, and should not be used as the sole basis for treatment or other patient management decisions. Negative results must be combined with clinical observations, patient history, and epidemiological information. The expected result is Negative. Fact Sheet for Patients: SugarRoll.be Fact Sheet for Healthcare  Providers: https://www.woods-mathews.com/ This test is not yet approved or cleared by the [place] FDA and  has been authorized for detection and/or diagnosis of SARS-CoV-2 by FDA under an Emergency Use Authorization (EUA). This EUA will remain  in effect (meaning this test can be used) for the duration of the COVID-19 declaration under Section 56 4(b)(1) of the Act, 21 U.S.C. section 360bbb-3(b)(1), unless the authorization is terminated or revoked sooner. Performed at [facility], [place] [address], [place], [place] [address]          Radiology Studies: Dg Chest 2 View  Result Date: 08/30/2018 CLINICAL DATA:  Tachypnea.  Chest pain. EXAM: CHEST - 2 VIEW COMPARISON:  08/30/18 FINDINGS: Cardiac enlargement, unchanged. No pleural effusion. Increase patchy scratch set pulmonary vascular congestion is identified. New patchy opacity within the left midlung identified which may represent asymmetric edema versus pneumonia. IMPRESSION: 1. Cardiac enlargement and pulmonary vascular congestion with new patchy left midlung opacity which may represent asymmetric edema versus pneumonia. Electronically Signed   By: Kerby Moors M.D.   On: 08/30/2018  18:09   US Renal  Result Date: 08/30/2018 CLINICAL DATA:  Acute renal failure. EXAM: RENAL / URINARY TRACT ULTRASOUND COMPLETE COMPARISON:  CT, 04/19/2009. FINDINGS: Right Kidney: Renal measurements: 9.9 x 4.8 x 5.4 cm = volume: 135.7 mL. Increased parenchymal echogenicity. Multiple cysts. In the midpole, at the midpole, there is a cortical cyst in a thin septation, but no other complicating feature, measuring 1.9 x 1.5 x 1.6 cm. There is also a hyperechoic renal mass, lower pole, measuring 1.5 x 1.3 x 1.5 cm. This corresponds to an angiomyolipoma present on the prior CT. No stones. No hydronephrosis. Left Kidney: Renal measurements: 9.0 x 4.7 x 5.3 cm = volume: 119.3 mL. Increased parenchymal echogenicity. Simple appearing midpole cyst  measuring 2.0 x 1.6 x 1.9 cm. No stones. No hydronephrosis. Bladder: Appears normal for degree of bladder distention. Hyperechoic lesion in the spleen measuring 1.6 x 1.4 x 1.6 cm, not evident on the prior CT, likely a hemangioma. IMPRESSION: 1. No acute findings. 2. Increased renal parenchymal echogenicity consistent with medical renal disease. 3. Renal cysts.  Right renal angiomyolipoma. 4. No stones.  No hydronephrosis. Electronically Signed   By: Lajean Manes M.D.   On: 08/30/2018 19:02   Dg Chest Port 1 View  Result Date: 08/30/2018 CLINICAL DATA:  Shortness of breath EXAM: PORTABLE CHEST 1 VIEW COMPARISON:  Chest radiograph 05/05/2012 FINDINGS: Cardiomegaly. Uncoiled and atherosclerotic thoracic aorta. There is increased density which projects over the medial left mediastinum, which was not well appreciated on prior exams. Shallow inspiration radiograph with crowding of the central bronchovascular markings. Mild ill-defined opacity at the left lung base with mild indistinctness of the left hemidiaphragm. No evidence of pleural effusion or pneumothorax. No acute bony abnormality IMPRESSION: Shallow inspiration radiograph. Cardiomegaly. Mild ill-defined opacity at the left lung base may reflect atelectasis. Pneumonia cannot be excluded. Indeterminate increased density projecting over the medial left mediastinum, which was not well appreciated on prior exams. Recommend PA and lateral radiographs for further characterization when clinically feasible. Electronically Signed   By: Kellie Simmering   On: 08/30/2018 10:09   Vas [place] Upper Ext Vein Mapping (pre-op Avf)  Result Date: 08/31/2018 UPPER EXTREMITY VEIN MAPPING  Indications: Pre-access. Comparison Study: No previous study available to Performing Technologist: Toma Copier RVS  Examination Guidelines: A complete evaluation includes B-mode imaging, spectral Doppler, color Doppler, and power Doppler as needed of all accessible portions of each vessel.  Bilateral testing is considered an integral part of a complete examination. Limited examinations for reoccurring indications may be performed as noted. +-----------------+------------+---------+------------------------------------+  Right Cephalic      Diameter     Depth                 Findings                                       (cm)       (cm)                                          +-----------------+------------+---------+------------------------------------+  Shoulder              0.38       1.11                                          +-----------------+------------+---------+------------------------------------+  Prox upper arm        0.44       1.52                                          +-----------------+------------+---------+------------------------------------+  Mid upper arm         0.44       0.77                                          +-----------------+------------+---------+------------------------------------+  Dist upper arm        0.48       0.67                                          +-----------------+------------+---------+------------------------------------+  Antecubital fossa     0.41       0.26                                          +-----------------+------------+---------+------------------------------------+  Prox forearm          0.38       0.39                                          +-----------------+------------+---------+------------------------------------+  Mid forearm           0.29       0.44                                          +-----------------+------------+---------+------------------------------------+  Wrist                                       not visualized and IVsite and                                                              dressing                +-----------------+------------+---------+------------------------------------+ +-----------------+-------------+----------+---------+  Right Basilic     Diameter (cm) Depth (cm) Findings    +-----------------+-------------+----------+---------+  Mid upper arm         0.46         1.34               +-----------------+-------------+----------+---------+  Dist upper arm        0.27         0.83               +-----------------+-------------+----------+---------+  Antecubital fossa     0.28         0.34               +-----------------+-------------+----------+---------+  Prox forearm          0.23         0.24               +-----------------+-------------+----------+---------+  Mid forearm           0.22         0.19               +-----------------+-------------+----------+---------+  Wrist                 0.17         0.23    branching  +-----------------+-------------+----------+---------+ +-----------------+-------------+----------+--------+  Left Cephalic     Diameter (cm) Depth (cm) Findings  +-----------------+-------------+----------+--------+  Shoulder              0.40         1.40              +-----------------+-------------+----------+--------+  Prox upper arm        0.36         0.65              +-----------------+-------------+----------+--------+  Mid upper arm         0.38         0.90              +-----------------+-------------+----------+--------+  Dist upper arm        0.37         0.60              +-----------------+-------------+----------+--------+  Antecubital fossa     0.43         0.36              +-----------------+-------------+----------+--------+  Prox forearm          0.46         0.42              +-----------------+-------------+----------+--------+  Mid forearm           0.30         0.62              +-----------------+-------------+----------+--------+  Wrist                 0.13         0.44              +-----------------+-------------+----------+--------+ +-----------------+-------------+----------+-----------------------------------+  Left Basilic      Diameter (cm) Depth (cm)              Findings                 +-----------------+-------------+----------+-----------------------------------+  Mid upper arm         0.46         2.60                                         +-----------------+-------------+----------+-----------------------------------+  Dist upper arm        0.34         1.11                                         +-----------------+-------------+----------+-----------------------------------+  Antecubital fossa     0.31         0.63                                         +-----------------+-------------+----------+-----------------------------------+  Prox forearm                                         not visualized             +-----------------+-------------+----------+-----------------------------------+  Mid forearm                                   branching, not visualized and                                                    multiple branching unable to                                                               identify                +-----------------+-------------+----------+-----------------------------------+  Wrist                                          not visualized and Too smal      +-----------------+-------------+----------+-----------------------------------+ *See table(s) above for measurements and observations.  Diagnosing physician:    Preliminary         Scheduled Meds:  febuxostat  40 mg Oral Daily   heparin  5,000 Units Subcutaneous Q8H   hydrALAZINE  100 mg Oral Q8H   insulin aspart  0-9 Units Subcutaneous TID WC   labetalol  400 mg Oral BID   [START ON 09/01/2018] LORazepam  1 mg Oral QHS   predniSONE  40 mg Oral Q breakfast   sertraline  150 mg Oral Daily   sodium chloride flush  3 mL Intravenous Q12H   Continuous Infusions:   sodium bicarbonate (isotonic) infusion in sterile water 50 mL/hr at 08/31/18 1133     LOS: 1 day    Time spent: 64 minutes    Danahi Reddish, MD [facility] Pager [phone]  If 7PM-7AM, please  contact night-coverage [email or web address] Password [facility] 08/31/2018, 4:27 PM

## 2018-08-31 NOTE — Progress Notes (Signed)
Bilateral upper extremity vein mapping completed. Preliminary results in Chart review CV Proc. [facility], [place] 08/31/2018, 4:27 PM

## 2018-08-31 NOTE — Progress Notes (Addendum)
Patient ID: Chelsea Roberts, female   DOB: [birth year], 63 y.o.   MRN: [id number] S: Feels a little better today. O:BP (!) 188/102   Pulse 77   Temp 98.2 F (36.8 C) (Oral)   Resp 18   Ht 5\' 6"  (1.676 m)   Wt 88.9 kg   SpO2 98%   BMI 31.63 kg/m   Intake/Output Summary (Last 24 hours) at 08/31/2018 1248 Last data filed at 08/31/2018 0953 Gross per 24 hour  Intake 164.6 ml  Output -  Net 164.6 ml   Intake/Output: I/O last 3 completed shifts: In: 44.6 [I.V.:44.6] Out: -   Intake/Output this shift:  Total I/O In: 120 [P.O.:120] Out: -  Weight change:  Gen: NAD CVS: no rub Resp: cta Abd: benign Ext: trace pretibial edema  Recent Labs  Lab 08/30/18 0949 08/30/18 1249 08/31/18 0229  NA 140  --  139  K 3.8  --  3.5  CL 109  --  107  CO2 16*  --  14*  GLUCOSE 129*  --  155*  BUN 71*  --  74*  CREATININE 7.49*  --  7.40*  ALBUMIN  --   --  2.8*  CALCIUM 8.8*  --  9.0  PHOS  --  6.8* 5.4*   Liver Function Tests: Recent Labs  Lab 08/31/18 0229  ALBUMIN 2.8*   No results for input(s): LIPASE, AMYLASE in the last 168 hours. No results for input(s): AMMONIA in the last 168 hours. CBC: Recent Labs  Lab 08/30/18 0949 08/31/18 0229  WBC 7.7 6.5  HGB 7.7* 8.7*  HCT 25.7* 28.2*  MCV 81.1 79.2*  PLT 213 289   Cardiac Enzymes: No results for input(s): CKTOTAL, CKMB, CKMBINDEX, TROPONINI in the last 168 hours. CBG: Recent Labs  Lab 08/30/18 1652 08/30/18 2223 08/31/18 0801 08/31/18 1233  GLUCAP 89 178* 125* 144*    Iron Studies:  Recent Labs    08/30/18 1430  IRON 17*  TIBC 228*  FERRITIN 226   Studies/Results: Dg Chest 2 View  Result Date: 08/30/2018 CLINICAL DATA:  Tachypnea.  Chest pain. EXAM: CHEST - 2 VIEW COMPARISON:  08/30/18 FINDINGS: Cardiac enlargement, unchanged. No pleural effusion. Increase patchy scratch set pulmonary vascular congestion is identified. New patchy opacity within the left midlung identified which may represent asymmetric  edema versus pneumonia. IMPRESSION: 1. Cardiac enlargement and pulmonary vascular congestion with new patchy left midlung opacity which may represent asymmetric edema versus pneumonia. Electronically Signed   By: Kerby Moors M.D.   On: 08/30/2018 18:09   US Renal  Result Date: 08/30/2018 CLINICAL DATA:  Acute renal failure. EXAM: RENAL / URINARY TRACT ULTRASOUND COMPLETE COMPARISON:  CT, 04/19/2009. FINDINGS: Right Kidney: Renal measurements: 9.9 x 4.8 x 5.4 cm = volume: 135.7 mL. Increased parenchymal echogenicity. Multiple cysts. In the midpole, at the midpole, there is a cortical cyst in a thin septation, but no other complicating feature, measuring 1.9 x 1.5 x 1.6 cm. There is also a hyperechoic renal mass, lower pole, measuring 1.5 x 1.3 x 1.5 cm. This corresponds to an angiomyolipoma present on the prior CT. No stones. No hydronephrosis. Left Kidney: Renal measurements: 9.0 x 4.7 x 5.3 cm = volume: 119.3 mL. Increased parenchymal echogenicity. Simple appearing midpole cyst measuring 2.0 x 1.6 x 1.9 cm. No stones. No hydronephrosis. Bladder: Appears normal for degree of bladder distention. Hyperechoic lesion in the spleen measuring 1.6 x 1.4 x 1.6 cm, not evident on the prior CT, likely a hemangioma. IMPRESSION:  1. No acute findings. 2. Increased renal parenchymal echogenicity consistent with medical renal disease. 3. Renal cysts.  Right renal angiomyolipoma. 4. No stones.  No hydronephrosis. Electronically Signed   By: Lajean Manes M.D.   On: 08/30/2018 19:02   Dg Chest Port 1 View  Result Date: 08/30/2018 CLINICAL DATA:  Shortness of breath EXAM: PORTABLE CHEST 1 VIEW COMPARISON:  Chest radiograph 05/05/2012 FINDINGS: Cardiomegaly. Uncoiled and atherosclerotic thoracic aorta. There is increased density which projects over the medial left mediastinum, which was not well appreciated on prior exams. Shallow inspiration radiograph with crowding of the central bronchovascular markings. Mild ill-defined  opacity at the left lung base with mild indistinctness of the left hemidiaphragm. No evidence of pleural effusion or pneumothorax. No acute bony abnormality IMPRESSION: Shallow inspiration radiograph. Cardiomegaly. Mild ill-defined opacity at the left lung base may reflect atelectasis. Pneumonia cannot be excluded. Indeterminate increased density projecting over the medial left mediastinum, which was not well appreciated on prior exams. Recommend PA and lateral radiographs for further characterization when clinically feasible. Electronically Signed   By: Kellie Simmering   On: 08/30/2018 10:09   . febuxostat  40 mg Oral Daily  . heparin  5,000 Units Subcutaneous Q8H  . hydrALAZINE  100 mg Oral Q8H  . insulin aspart  0-9 Units Subcutaneous TID WC  . labetalol  200 mg Oral BID  . [START ON 09/01/2018] LORazepam  1 mg Oral QHS  . predniSONE  40 mg Oral Q breakfast  . sertraline  150 mg Oral Daily  . sodium chloride flush  3 mL Intravenous Q12H    BMET    Component Value Date/Time   NA 139 08/31/2018 0229   K 3.5 08/31/2018 0229   CL 107 08/31/2018 0229   CO2 14 (L) 08/31/2018 0229   GLUCOSE 155 (H) 08/31/2018 0229   BUN 74 (H) 08/31/2018 0229   CREATININE 7.40 (H) 08/31/2018 0229   CREATININE 1.57 (H) 08/19/2012 1740   CALCIUM 9.0 08/31/2018 0229   GFRNONAA 5 (L) 08/31/2018 0229   GFRAA 6 (L) 08/31/2018 0229   CBC    Component Value Date/Time   WBC 6.5 08/31/2018 0229   RBC 3.56 (L) 08/31/2018 0229   HGB 8.7 (L) 08/31/2018 0229   HCT 28.2 (L) 08/31/2018 0229   PLT 289 08/31/2018 0229   MCV 79.2 (L) 08/31/2018 0229   MCV 84.9 07/02/2012 1617   MCH 24.4 (L) 08/31/2018 0229   MCHC 30.9 08/31/2018 0229   RDW 15.4 08/31/2018 0229   LYMPHSABS 2.3 05/05/2012 0915   MONOABS 0.6 05/05/2012 0915   EOSABS 0.0 05/05/2012 0915   BASOSABS 0.0 05/05/2012 0915    Assessment/Plan: 1.  AKI/CKD stage 4 vs progressive CKD to stage 5- pt has been lost to follow up twice at our office and each time  she presented again with advanced disease.  Her workup in November revealed negative SPEP but she did not have further studies.   1. FeNa >1%, UA with +prot neg blood. 2. Renal US with significant decrease in size compared to 2011 [place] suggesting progressive chronic medical renal disease.  No obstruction 3. This likely represents progressive CKD and may be now at ESRD.  Will continue to educate and prepare for HD with vein mapping and VVS consult for access placement. 4. Will hold off on [facility] for now as she is reluctant to start HD at this time and is without uremic symptoms. 2. HTN- poorly controlled.  Agree with resuming outpatient medications and  will increase hydralazine to three times a day.  1. Would add clonidine 0.1mg  bid and follow but apparently she described an intolerance.  Will increase labetalol to 400 mg bid and possibly tid if needed.  3. Anemia of CKD - and also with low iron stores.  Will replete IV iron and will likely require ESA once her BP is under better control 4. Metabolic acidosis- due to #1.  Will start bicarb and follow 5. Pulmonary- mild opacity of the left lung base- await PA and lateral films.  covid-19 test pending  6. Secondary HPTH- elevated phos will check iPTH and start binders 7. Pre-diabetes- elevated hgb a1c at 5.7% 8. Chronic systolic CHF- euvolemic on exam 9. HLD- not on meds 10. Gout- currently on prednisone.   Donetta Potts, MD [employer] [phone]

## 2018-08-31 NOTE — Consult Note (Addendum)
Hospital Consult    Reason for Consult:  In need of dialysis access Requesting PhysicianMarval Regal  MRN #:  [id number]  History of Present Illness: This is a 63 y.o. female who has hx of poorly controlled HTN, CHF, OSA but not on CPAP, hyperlipidemia, and gout.  She has hx of CKD 4 and had been lost to f/u with [facility].  She states she has hx of mini stroke when she was in hospital sometime around 2013.  She does not know what sx she had as she says they told her she did have a stroke.  She has not had any sx of stroke since then.  She states she is having a flare of gout on her right great toe and difficulty squeezing her hands due to gout.    Her sister is on HD via AVG after failed transplant that she had for many years.    She presented to hospital yesterday with c/o light headedness, dizziness and weakness.  States she just hasn't felt good and has gotten worse.  She has hx of 102 fever about a week ago. She has had some sob with exertion.  She was hypertensive with systolic in 086'P with increased renal function in the ER.    [facility] is consulted for permanent dialysis access.  Per their note, they are going to hold on the [facility] at this time as she is reluctant to start HD and is without uremic sx.  BUE vein mapping has been ordered.   The pt is not on a statin for cholesterol management.  The pt is not on a daily aspirin.   Other AC:  Heparin sq The pt is on BB for hypertension.   The pt is pre diabetic.  On SSI here Tobacco hx:  never  Past Medical History:  Diagnosis Date  . Anxiety   . Breast mass   . Chronic kidney disease   . Complication of anesthesia    severe htn-had to take 2 ativan preop  . Depression   . Gout   . Hyperlipidemia   . Hypertension   . Pre-diabetes   . Sleep apnea    NO CPAP use per pt., lost 70lbs  . Systolic CHF, chronic ([place]) 2013    Past Surgical History:  Procedure Laterality Date  . ABDOMINAL HYSTERECTOMY  1999   partial  . BREAST BIOPSY   01/01/2012   Procedure: BREAST BIOPSY WITH NEEDLE LOCALIZATION;  Surgeon: Haywood Lasso, MD;  Location: [place];  Service: General;  Laterality: Right;  Needle localization excision Right breast mass  . BREAST CYST EXCISION  12/2011  . BREAST LUMPECTOMY WITH RADIOACTIVE SEED LOCALIZATION Left 10/09/2017   Procedure: BREAST LUMPECTOMY WITH RADIOACTIVE SEED LOCALIZATION;  Surgeon: Jovita Kussmaul, MD;  Location: [place];  Service: General;  Laterality: Left;    Allergies  Allergen Reactions  . Clonidine Derivatives     Extreme dry mouth  . Diovan [Valsartan]     headache  . Hctz [Hydrochlorothiazide]     gout  . Lisinopril Cough  . Norvasc [Amlodipine Besylate] Swelling  . Procardia [Nifedipine] Swelling  . Sulfa Antibiotics Swelling  . Tekturna [Aliskiren] Swelling    Prior to Admission medications   Medication Sig Start Date End Date Taking? Authorizing Provider  carvedilol (COREG) 25 MG tablet Take 25 mg by mouth 2 (two) times daily with a meal.    Yes [provider]  hydrALAZINE (APRESOLINE) 50 MG tablet Take  100 mg by mouth 2 (two) times daily.    Yes [provider]  LORazepam (ATIVAN) 1 MG tablet Take 1 tablet (1 mg total) by mouth 2 (two) times daily. Patient taking differently: Take 1 mg by mouth daily.  01/25/12  Yes Wardell Honour, MD  Multiple Vitamin (MULITIVITAMIN WITH MINERALS) TABS Take 1 tablet by mouth daily.   Yes [provider]  sertraline (ZOLOFT) 100 MG tablet Take 1.5 tablets (150 mg total) by mouth daily. PATIENT NEEDS OFFICE VISIT FOR ADDITIONAL REFILLS Patient taking differently: Take 150 mg by mouth daily.  02/01/13  Yes Bailey Mech E, PA-C  colchicine 0.6 MG tablet Take 1 tablet (0.6 mg total) by mouth daily. Patient not taking: Reported on 08/30/2018 06/30/13   Leeanne Rio, MD  Febuxostat (ULORIC) 80 MG TABS Take 1 tablet (80 mg total) by mouth daily. Patient taking differently:  Take 40 mg by mouth daily.  04/15/12   Darlyne Russian, MD  HYDROcodone-acetaminophen (NORCO/VICODIN) 5-325 MG tablet Take 1-2 tablets by mouth every 6 (six) hours as needed for moderate pain or severe pain. Patient not taking: Reported on 08/30/2018 10/09/17   Jovita Kussmaul, MD    Social History   Socioeconomic History  . Marital status: Single    Spouse name: n/a  . Number of children: 0  . Years of education: Not on file  . Highest education level: Not on file  Occupational History  . Occupation: Animator: Therapist, nutritional  . Occupation: Scientist, water quality: [employer]  Social Needs  . Financial resource strain: Not on file  . Food insecurity    Worry: Not on file    Inability: Not on file  . Transportation needs    Medical: Not on file    Non-medical: Not on file  Tobacco Use  . Smoking status: Never Smoker  . Smokeless tobacco: Never Used  Substance and Sexual Activity  . Alcohol use: No  . Drug use: No  . Sexual activity: Not on file  Lifestyle  . Physical activity    Days per week: Not on file    Minutes per session: Not on file  . Stress: Not on file  Relationships  . Social Herbalist on phone: Not on file    Gets together: Not on file    Attends religious service: Not on file    Active member of club or organization: Not on file    Attends meetings of clubs or organizations: Not on file    Relationship status: Not on file  . Intimate partner violence    Fear of current or ex partner: Not on file    Emotionally abused: Not on file    Physically abused: Not on file    Forced sexual activity: Not on file  Other Topics Concern  . Not on file  Social History Narrative   Marital:  Divorced; dating.     Children: none     Lives in [place], [place] alone.      Employment:  [employer] [place].      Tobacco: none      Family History  Problem Relation Age of Onset  . Heart failure Sister   . Diabetes  Sister   . Hypertension Sister   . Kidney disease Sister   . Hypertension Sister   . Diabetes Sister   . Colon polyps Sister   . Hypertension  Mother   . Hypertension Sister   . Cancer Father        lung cancer; smoker and asbestos exposure  . Diabetes Father   . Cancer Maternal Aunt        ovarian  . Cancer Maternal Grandmother        breast  . Colon cancer Maternal Grandmother 74  . Diabetes type II Sister     ROS: [x]  Positive   [ ]  Negative   [ ]  All sytems reviewed and are negative  Cardiac: [x]  high blood pressure [x]  increased cholesterol [x]  DOE  Vascular: Denies claudication pain  Pulmonary: [x]  sleep apnea  Neurologic: []  hx of CVA [x]  mini stroke [x]  light headedness  Hematologic: []  hx of cancer  Endocrine:   [x]  pre diabetes   GI []  vomiting blood []  blood in stool  GU: [x]  CKD/renal failure []  HD--[]  M/W/F or []  T/T/S  Psychiatric: [x]  anxiety [x]  depression  Musculoskeletal: [x]  gout  Integumentary: []  rashes []  ulcers  Constitutional: [x]  fever   Physical Examination  Vitals:   08/31/18 0951 08/31/18 1235  BP: (!) 175/106 (!) 188/102  Pulse: 81 77  Resp:    Temp:    SpO2:     Body mass index is 31.63 kg/m.  General:  WDWN in NAD Gait: Not observed HENT: WNL, normocephalic Pulmonary: normal non-labored breathing, without Rales, rhonchi,  wheezing Cardiac: regular, without  Murmurs, rubs or gallops; without carotid bruits Abdomen:  soft, NT/ND, no masses Skin: without rashes Vascular Exam/Pulses:  Right Left  Radial 2+ (normal) 2+ (normal)  Ulnar 1+ (weak) 1+ (weak)  DP 2+ (normal) 2+ (normal)  PT 2+ (normal) 2+ (normal)   Extremities: without ischemic changes, without Gangrene , without cellulitis; without open wounds; gout flare on the right great toe. Musculoskeletal: no muscle wasting or atrophy  Neurologic: A&O X 3;  No focal weakness or paresthesias are detected; speech is fluent/normal Psychiatric:  The  pt has Normal affect.   CBC    Component Value Date/Time   WBC 6.5 08/31/2018 0229   RBC 3.56 (L) 08/31/2018 0229   HGB 8.7 (L) 08/31/2018 0229   HCT 28.2 (L) 08/31/2018 0229   PLT 289 08/31/2018 0229   MCV 79.2 (L) 08/31/2018 0229   MCV 84.9 07/02/2012 1617   MCH 24.4 (L) 08/31/2018 0229   MCHC 30.9 08/31/2018 0229   RDW 15.4 08/31/2018 0229   LYMPHSABS 2.3 05/05/2012 0915   MONOABS 0.6 05/05/2012 0915   EOSABS 0.0 05/05/2012 0915   BASOSABS 0.0 05/05/2012 0915    BMET    Component Value Date/Time   NA 139 08/31/2018 0229   K 3.5 08/31/2018 0229   CL 107 08/31/2018 0229   CO2 14 (L) 08/31/2018 0229   GLUCOSE 155 (H) 08/31/2018 0229   BUN 74 (H) 08/31/2018 0229   CREATININE 7.40 (H) 08/31/2018 0229   CREATININE 1.57 (H) 08/19/2012 1740   CALCIUM 9.0 08/31/2018 0229   GFRNONAA 5 (L) 08/31/2018 0229   GFRAA 6 (L) 08/31/2018 0229    COAGS: Lab Results  Component Value Date   INR 1.28 04/20/2009     Non-Invasive Vascular Imaging:   BUE vein mapping ordered and pending   ASSESSMENT/PLAN: This is a 62 y.o. female AKI on CKD 4/5 now in need of permanent dialysis access and she is right hand dominant.  -discussed options of permanent dialysis access-will need vein mapping to determine best access for pt.  She states that she would prefer  a graft as her sister dialyzes via a graft after failed transplant.  I explained to her that if we can get a good fistula working, it would be better for her.  She will discuss with on call MD, who will see pt later today or tomorrow.  -she does have an IV in the left arm.  Given she is right hand dominant, I discussed with her that we should move this IV to the right arm and she should not let them stick needles or take blood pressure in the left arm -per renal, Will hold off on [facility] for now as she is reluctant to start HD at this time and is without uremic symptoms.   Leontine Locket, PA-C [facility] [phone]     I agree with the above.  I have seen and examined the patient.  Her husband was present for our discussions.  We discussed a left arm access.  If she has an adequate vein, a fistula would be placed, if not, a graft would be placed.  She is apprehensive about dialysis.  She is agreeable with surgery tomorrow.  I wouldn't be surprised if she changes her mind.  The details of the surgery were discussed including but not limited to steal, the need for additional operations, non-maturity.  She will be NPO after midnight.  We discussed that one o f my partners may do her case.   Annamarie Major

## 2018-08-31 NOTE — Progress Notes (Signed)
   08/31/18 0951  Vitals  BP (!) 175/106  MAP (mmHg) 125   Dr. Lunette Stands paged regarding elevated blood pressure after Coreg administration.

## 2018-08-31 NOTE — Progress Notes (Signed)
   08/31/18 1557 08/31/18 1647  Vitals  BP (!) 201/100 (!) 215/102  MAP (mmHg) 130 134    PRN IV Labetalol administered at 1621. Dr. Lunette Stands paged to notify. Awaiting response. Will continue to monitor.   Hiram Comber, RN 08/31/2018 4:50 PM

## 2018-08-31 NOTE — Progress Notes (Signed)
Initial Nutrition Assessment  DOCUMENTATION CODES:   Obesity unspecified  INTERVENTION:  -Ensure Enlive po BID, each supplement provides 350 kcal and 20 grams of protein -MVI   NUTRITION DIAGNOSIS:   Increased nutrient needs related to chronic illness(CKD4 with likely progression to ESRD) as evidenced by estimated needs.   GOAL:   Patient will meet greater than or equal to 90% of their needs    MONITOR:   PO intake, Labs, I & O's, Weight trends, Supplement acceptance, Skin  REASON FOR ASSESSMENT:   Malnutrition Screening Tool    ASSESSMENT:  RD working remotely.  62 year old female with medical history significant of CHF, OSA not on CPAP, pre-diabetes, HTN, HLD, gout, CKD presented to ED with weakness, dizziness associated with chest pain and flares of gout to right toe; foot; hand and left hand for several weeks. In ED she was found to have systolic blood pressure in 200s and Cr of 7.5. Patient admitted with acute renal failure superimposed on CKD;  [facility] consulted for permanent dialysis access.  Unable to reach patient via phone today. Per chart review, patient last seen for CKD in November of 2019 by Dr. Joelyn Oms. At that time, pt Cr 3.5, pt reported that she lost her insurance and did not follow up despite being told of her advanced CKD.  Per vascular, pt reluctant to have to start HD and is without uremic symptoms; holding on AV graft placement at this time. BUE vein mapping completed this afternoon.   No recorded meals at this time; RD will continue to monitor for po intake at meal times. RD to order Ensure BID to assist with calorie and protein needs.   Current wt 88.9 kg Last wt 101.2 kg taken 09/2017 Per wt history, patient has lost 27.1 lbs over the past year (12.2%) insignificant for time frame  Medications reviewed and include: uloric, hydralazine, SSI, prednisone PRN: Normodyne-  given this afternoon for  SBP >126mmhg  Labs: BUN 74 - trending up Cr 7.4 -  trending up Phosphorus  5.4 - trending down CBGS 89-178  NUTRITION - FOCUSED PHYSICAL EXAM: Unable to complete at this time   Diet Order:   Diet Order            Diet NPO time specified  Diet effective midnight        Diet renal with fluid restriction Room service appropriate? Yes; Fluid consistency: Thin  Diet effective now              EDUCATION NEEDS:   No education needs have been identified at this time  Skin:  Skin Assessment: Reviewed RN Assessment  Last BM:  8/10  Height:   Ht Readings from Last 1 Encounters:  08/30/18 5\' 6"  (1.676 m)    Weight:   Wt Readings from Last 1 Encounters:  08/30/18 88.9 kg    Ideal Body Weight:  59.1 kg  BMI:  Body mass index is 31.63 kg/m.  Estimated Nutritional Needs:   Kcal:  1900-2100  Protein:  95-105  Fluid:  per MD  Lajuan Lines, RD, [place] [phone] After Hours/Weekend Pager: [phone]

## 2018-09-01 ENCOUNTER — Encounter (HOSPITAL_COMMUNITY)
Admission: EM | Disposition: A | Discharge status: Home / Self Care | Payer: Self-pay | Source: Home / Self Care | Attending: Internal Medicine

## 2018-09-01 ENCOUNTER — Inpatient Hospital Stay (HOSPITAL_COMMUNITY): Payer: Medicaid Other | Admitting: Anesthesiology

## 2018-09-01 ENCOUNTER — Encounter (HOSPITAL_COMMUNITY): Payer: Self-pay | Admitting: Orthopedic Surgery

## 2018-09-01 DIAGNOSIS — N181 Chronic kidney disease, stage 1: Secondary | ICD-10-CM

## 2018-09-01 DIAGNOSIS — N189 Chronic kidney disease, unspecified: Secondary | ICD-10-CM

## 2018-09-01 DIAGNOSIS — N184 Chronic kidney disease, stage 4 (severe): Secondary | ICD-10-CM

## 2018-09-01 DIAGNOSIS — I150 Renovascular hypertension: Secondary | ICD-10-CM

## 2018-09-01 HISTORY — PX: AV FISTULA PLACEMENT: SHX1204

## 2018-09-01 LAB — GLUCOSE, CAPILLARY
Glucose-Capillary: 118 mg/dL — ABNORMAL HIGH (ref 70–99)
Glucose-Capillary: 106 mg/dL — ABNORMAL HIGH (ref 70–99)
Glucose-Capillary: 94 mg/dL (ref 70–99)
Glucose-Capillary: 164 mg/dL — ABNORMAL HIGH (ref 70–99)
Glucose-Capillary: 160 mg/dL — ABNORMAL HIGH (ref 70–99)

## 2018-09-01 LAB — RENAL FUNCTION PANEL
Albumin: 2.6 g/dL — ABNORMAL LOW (ref 3.5–5.0)
Anion gap: 14 (ref 5–15)
BUN: 81 mg/dL — ABNORMAL HIGH (ref 8–23)
CO2: 20 mmol/L — ABNORMAL LOW (ref 22–32)
Calcium: 8.8 mg/dL — ABNORMAL LOW (ref 8.9–10.3)
Chloride: 106 mmol/L (ref 98–111)
Creatinine, Ser: 7.65 mg/dL — ABNORMAL HIGH (ref 0.44–1.00)
GFR calc Af Amer: 6 mL/min — ABNORMAL LOW (ref >60)
GFR calc non Af Amer: 5 mL/min — ABNORMAL LOW (ref >60)
Glucose, Bld: 163 mg/dL — ABNORMAL HIGH (ref 70–99)
Phosphorus: 5.2 mg/dL — ABNORMAL HIGH (ref 2.5–4.6)
Potassium: 3.4 mmol/L — ABNORMAL LOW (ref 3.5–5.1)
Sodium: 140 mmol/L (ref 135–145)

## 2018-09-01 LAB — SURGICAL PCR SCREEN
MRSA, PCR: NEGATIVE
Staphylococcus aureus: POSITIVE — AB

## 2018-09-01 SURGERY — ARTERIOVENOUS (AV) FISTULA CREATION
Anesthesia: General | Laterality: Left

## 2018-09-01 MED ORDER — FENTANYL CITRATE (PF) 100 MCG/2ML IJ SOLN
25.0000 ug | INTRAMUSCULAR | Status: DC | PRN
  Administered 2018-09-01: 50 ug via INTRAVENOUS

## 2018-09-01 MED ORDER — SODIUM CHLORIDE 0.9 % IV SOLN
INTRAVENOUS | Status: DC | PRN
  Administered 2018-09-01: 13:00:00 via INTRAVENOUS

## 2018-09-01 MED ORDER — PROTAMINE SULFATE 10 MG/ML IV SOLN
INTRAVENOUS | Status: AC
  Filled 2018-09-01: qty 5

## 2018-09-01 MED ORDER — CEFAZOLIN SODIUM-DEXTROSE 2-3 GM-%(50ML) IV SOLR
INTRAVENOUS | Status: DC | PRN
  Administered 2018-09-01: 2 g via INTRAVENOUS

## 2018-09-01 MED ORDER — PROMETHAZINE HCL 25 MG/ML IJ SOLN: 6.2500 mg | INTRAMUSCULAR | Status: DC | PRN

## 2018-09-01 MED ORDER — PROPOFOL 10 MG/ML IV BOLUS
INTRAVENOUS | Status: DC | PRN
  Administered 2018-09-01: 200 mg via INTRAVENOUS

## 2018-09-01 MED ORDER — HYDRALAZINE HCL 20 MG/ML IJ SOLN
10.0000 mg | Freq: Once | INTRAMUSCULAR | Status: AC
  Administered 2018-09-01: 10 mg via INTRAVENOUS
  Filled 2018-09-01: qty 1

## 2018-09-01 MED ORDER — PHENYLEPHRINE 40 MCG/ML (10ML) SYRINGE FOR IV PUSH (FOR BLOOD PRESSURE SUPPORT)
PREFILLED_SYRINGE | INTRAVENOUS | Status: DC | PRN
  Administered 2018-09-01: 160 ug via INTRAVENOUS
  Administered 2018-09-01: 80 ug via INTRAVENOUS

## 2018-09-01 MED ORDER — ACETAMINOPHEN 500 MG PO TABS: 1000.0000 mg | ORAL_TABLET | Freq: Once | ORAL | Status: DC

## 2018-09-01 MED ORDER — MIDAZOLAM HCL 2 MG/2ML IJ SOLN
INTRAMUSCULAR | Status: DC | PRN
  Administered 2018-09-01: 2 mg via INTRAVENOUS

## 2018-09-01 MED ORDER — SUCCINYLCHOLINE CHLORIDE 200 MG/10ML IV SOSY
PREFILLED_SYRINGE | INTRAVENOUS | Status: AC
  Filled 2018-09-01: qty 10

## 2018-09-01 MED ORDER — SODIUM CHLORIDE 0.9 % IV SOLN
INTRAVENOUS | Status: DC
  Administered 2018-09-01: 12:00:00 via INTRAVENOUS

## 2018-09-01 MED ORDER — SODIUM CHLORIDE 0.9 % IV SOLN
250.0000 mg | Freq: Every day | INTRAVENOUS | Status: AC
  Administered 2018-09-01 – 2018-09-04 (×4): 250 mg via INTRAVENOUS
  Filled 2018-09-01: qty 20
  Filled 2018-09-01: qty 10
  Filled 2018-09-01 (×2): qty 20

## 2018-09-01 MED ORDER — DARBEPOETIN ALFA 40 MCG/0.4ML IJ SOSY
40.0000 ug | PREFILLED_SYRINGE | INTRAMUSCULAR | Status: DC
  Administered 2018-09-01: 40 ug via SUBCUTANEOUS
  Filled 2018-09-01: qty 0.4

## 2018-09-01 MED ORDER — SODIUM CHLORIDE 0.9 % IV SOLN
INTRAVENOUS | Status: AC
  Filled 2018-09-01: qty 1.2

## 2018-09-01 MED ORDER — EPHEDRINE SULFATE-NACL 50-0.9 MG/10ML-% IV SOSY
PREFILLED_SYRINGE | INTRAVENOUS | Status: DC | PRN
  Administered 2018-09-01: 15 mg via INTRAVENOUS
  Administered 2018-09-01 (×3): 10 mg via INTRAVENOUS

## 2018-09-01 MED ORDER — SODIUM CHLORIDE 0.9 % IV SOLN
INTRAVENOUS | Status: DC | PRN
  Administered 2018-09-01: 500 mL

## 2018-09-01 MED ORDER — DEXAMETHASONE SODIUM PHOSPHATE 10 MG/ML IJ SOLN
INTRAMUSCULAR | Status: DC | PRN
  Administered 2018-09-01: 4 mg via INTRAVENOUS

## 2018-09-01 MED ORDER — FENTANYL CITRATE (PF) 100 MCG/2ML IJ SOLN
INTRAMUSCULAR | Status: AC
  Filled 2018-09-01: qty 2

## 2018-09-01 MED ORDER — MIDAZOLAM HCL 2 MG/2ML IJ SOLN
INTRAMUSCULAR | Status: AC
  Filled 2018-09-01: qty 2

## 2018-09-01 MED ORDER — FENTANYL CITRATE (PF) 250 MCG/5ML IJ SOLN
INTRAMUSCULAR | Status: AC
  Filled 2018-09-01: qty 5

## 2018-09-01 MED ORDER — PROPOFOL 10 MG/ML IV BOLUS
INTRAVENOUS | Status: AC
  Filled 2018-09-01: qty 20

## 2018-09-01 MED ORDER — MUPIROCIN 2 % EX OINT
1.0000 "application " | TOPICAL_OINTMENT | Freq: Two times a day (BID) | CUTANEOUS | Status: AC
  Administered 2018-09-01 – 2018-09-05 (×10): 1 via NASAL
  Filled 2018-09-01 (×3): qty 22

## 2018-09-01 MED ORDER — DIPHENHYDRAMINE HCL 50 MG/ML IJ SOLN
INTRAMUSCULAR | Status: DC | PRN
  Administered 2018-09-01: 12.5 mg via INTRAVENOUS

## 2018-09-01 MED ORDER — LIDOCAINE 2% (20 MG/ML) 5 ML SYRINGE
INTRAMUSCULAR | Status: AC
  Filled 2018-09-01: qty 5

## 2018-09-01 MED ORDER — ONDANSETRON HCL 4 MG/2ML IJ SOLN
INTRAMUSCULAR | Status: DC | PRN
  Administered 2018-09-01: 4 mg via INTRAVENOUS

## 2018-09-01 MED ORDER — FENTANYL CITRATE (PF) 100 MCG/2ML IJ SOLN
INTRAMUSCULAR | Status: DC | PRN
  Administered 2018-09-01 (×3): 50 ug via INTRAVENOUS
  Administered 2018-09-01: 100 ug via INTRAVENOUS

## 2018-09-01 MED ORDER — 0.9 % SODIUM CHLORIDE (POUR BTL) OPTIME
TOPICAL | Status: DC | PRN
  Administered 2018-09-01: 1000 mL

## 2018-09-01 MED ORDER — ONDANSETRON HCL 4 MG/2ML IJ SOLN
INTRAMUSCULAR | Status: AC
  Filled 2018-09-01: qty 2

## 2018-09-01 MED ORDER — LIDOCAINE 2% (20 MG/ML) 5 ML SYRINGE
INTRAMUSCULAR | Status: DC | PRN
  Administered 2018-09-01: 70 mg via INTRAVENOUS

## 2018-09-01 MED ORDER — SODIUM CHLORIDE 0.9 % IV SOLN
INTRAVENOUS | Status: DC | PRN
  Administered 2018-09-01: 50 ug/min via INTRAVENOUS

## 2018-09-01 SURGICAL SUPPLY — 33 items
ADH SKN CLS APL DERMABOND .7 (GAUZE/BANDAGES/DRESSINGS) ×1
ARMBAND PINK RESTRICT EXTREMIT (MISCELLANEOUS) ×3 IMPLANT
BRUSH SCRUB EZ PLAIN DRY (MISCELLANEOUS) ×1 IMPLANT
CANISTER SUCT 3000ML PPV (MISCELLANEOUS) ×2 IMPLANT
CANNULA VESSEL 3MM 2 BLNT TIP (CANNULA) ×2 IMPLANT
CLIP LIGATING EXTRA MED SLVR (CLIP) ×2 IMPLANT
CLIP LIGATING EXTRA SM BLUE (MISCELLANEOUS) ×2 IMPLANT
COVER PROBE W GEL 5X96 (DRAPES) ×2 IMPLANT
COVER WAND RF STERILE (DRAPES) ×1 IMPLANT
DECANTER SPIKE VIAL GLASS SM (MISCELLANEOUS) ×1 IMPLANT
DERMABOND ADVANCED (GAUZE/BANDAGES/DRESSINGS) ×1
DERMABOND ADVANCED .7 DNX12 (GAUZE/BANDAGES/DRESSINGS) ×1 IMPLANT
ELECT REM PT RETURN 9FT ADLT (ELECTROSURGICAL) ×2
ELECTRODE REM PT RTRN 9FT ADLT (ELECTROSURGICAL) ×1 IMPLANT
GLOVE BIO SURGEON STRL SZ 6.5 (GLOVE) ×2 IMPLANT
GLOVE BIOGEL PI IND STRL 6.5 (GLOVE) IMPLANT
GLOVE BIOGEL PI INDICATOR 6.5 (GLOVE) ×3
GLOVE SS BIOGEL STRL SZ 7.5 (GLOVE) ×1 IMPLANT
GLOVE SUPERSENSE BIOGEL SZ 7.5 (GLOVE) ×1
GLOVE SURG SS PI 6.5 STRL IVOR (GLOVE) ×1 IMPLANT
GOWN STRL REUS W/ TWL LRG LVL3 (GOWN DISPOSABLE) ×3 IMPLANT
GOWN STRL REUS W/TWL LRG LVL3 (GOWN DISPOSABLE) ×6
KIT BASIN OR (CUSTOM PROCEDURE TRAY) ×2 IMPLANT
KIT TURNOVER KIT B (KITS) ×2 IMPLANT
NS IRRIG 1000ML POUR BTL (IV SOLUTION) ×2 IMPLANT
PACK CV ACCESS (CUSTOM PROCEDURE TRAY) ×2 IMPLANT
PAD ARMBOARD 7.5X6 YLW CONV (MISCELLANEOUS) ×4 IMPLANT
SUT PROLENE 6 0 CC (SUTURE) ×2 IMPLANT
SUT VIC AB 3-0 SH 27 (SUTURE) ×2
SUT VIC AB 3-0 SH 27X BRD (SUTURE) ×1 IMPLANT
TOWEL GREEN STERILE (TOWEL DISPOSABLE) ×2 IMPLANT
UNDERPAD 30X30 (UNDERPADS AND DIAPERS) ×2 IMPLANT
WATER STERILE IRR 1000ML POUR (IV SOLUTION) ×2 IMPLANT

## 2018-09-01 NOTE — Progress Notes (Signed)
Patient ID: Chelsea Roberts, female   DOB: February 27, 1955, 63 y.o.   MRN: [id number] S: No complaints but a little anxious about surgery O:BP (!) 190/102 (BP Location: Right Arm)   Pulse 71   Temp 98.4 F (36.9 C) (Oral)   Resp 17   Ht 5' 5.98" (1.676 m)   Wt 88.9 kg   SpO2 99%   BMI 31.65 kg/m   Intake/Output Summary (Last 24 hours) at 09/01/2018 1243 Last data filed at 09/01/2018 0706 Gross per 24 hour  Intake 1000 ml  Output 1600 ml  Net -600 ml   Intake/Output: I/O last 3 completed shifts: In: 164.6 [P.O.:120; I.V.:44.6] Out: 1600 [Urine:1600]  Intake/Output this shift:  Total I/O In: 1000 [I.V.:1000] Out: -  Weight change:  Gen: NAd CVS:no rub Resp: cta Abd: benign Ext: no edema  Recent Labs  Lab 08/30/18 0949 08/30/18 1249 08/31/18 0229 09/01/18 0303  NA 140  --  139 140  K 3.8  --  3.5 3.4*  CL 109  --  107 106  CO2 16*  --  14* 20*  GLUCOSE 129*  --  155* 163*  BUN 71*  --  74* 81*  CREATININE 7.49*  --  7.40* 7.65*  ALBUMIN  --   --  2.8* 2.6*  CALCIUM 8.8*  --  9.0 8.8*  PHOS  --  6.8* 5.4* 5.2*   Liver Function Tests: Recent Labs  Lab 08/31/18 0229 09/01/18 0303  ALBUMIN 2.8* 2.6*   No results for input(s): LIPASE, AMYLASE in the last 168 hours. No results for input(s): AMMONIA in the last 168 hours. CBC: Recent Labs  Lab 08/30/18 0949 08/31/18 0229  WBC 7.7 6.5  HGB 7.7* 8.7*  HCT 25.7* 28.2*  MCV 81.1 79.2*  PLT 213 289   Cardiac Enzymes: No results for input(s): CKTOTAL, CKMB, CKMBINDEX, TROPONINI in the last 168 hours. CBG: Recent Labs  Lab 08/31/18 1233 08/31/18 1629 08/31/18 2125 09/01/18 0812 09/01/18 1142  GLUCAP 144* 167* 220* 118* 106*    Iron Studies:  Recent Labs    08/30/18 1430  IRON 17*  TIBC 228*  FERRITIN 226   Studies/Results: Dg Chest 2 View  Result Date: 08/30/2018 CLINICAL DATA:  Tachypnea.  Chest pain. EXAM: CHEST - 2 VIEW COMPARISON:  08/30/18 FINDINGS: Cardiac enlargement, unchanged. No pleural  effusion. Increase patchy scratch set pulmonary vascular congestion is identified. New patchy opacity within the left midlung identified which may represent asymmetric edema versus pneumonia. IMPRESSION: 1. Cardiac enlargement and pulmonary vascular congestion with new patchy left midlung opacity which may represent asymmetric edema versus pneumonia. Electronically Signed   By: Kerby Moors M.D.   On: 08/30/2018 18:09   US Renal  Result Date: 08/30/2018 CLINICAL DATA:  Acute renal failure. EXAM: RENAL / URINARY TRACT ULTRASOUND COMPLETE COMPARISON:  CT, 04/19/2009. FINDINGS: Right Kidney: Renal measurements: 9.9 x 4.8 x 5.4 cm = volume: 135.7 mL. Increased parenchymal echogenicity. Multiple cysts. In the midpole, at the midpole, there is a cortical cyst in a thin septation, but no other complicating feature, measuring 1.9 x 1.5 x 1.6 cm. There is also a hyperechoic renal mass, lower pole, measuring 1.5 x 1.3 x 1.5 cm. This corresponds to an angiomyolipoma present on the prior CT. No stones. No hydronephrosis. Left Kidney: Renal measurements: 9.0 x 4.7 x 5.3 cm = volume: 119.3 mL. Increased parenchymal echogenicity. Simple appearing midpole cyst measuring 2.0 x 1.6 x 1.9 cm. No stones. No hydronephrosis. Bladder: Appears normal for degree  of bladder distention. Hyperechoic lesion in the spleen measuring 1.6 x 1.4 x 1.6 cm, not evident on the prior CT, likely a hemangioma. IMPRESSION: 1. No acute findings. 2. Increased renal parenchymal echogenicity consistent with medical renal disease. 3. Renal cysts.  Right renal angiomyolipoma. 4. No stones.  No hydronephrosis. Electronically Signed   By: Lajean Manes M.D.   On: 08/30/2018 19:02   Vas [place] Upper Ext Vein Mapping (pre-op Avf)  Result Date: 08/31/2018 UPPER EXTREMITY VEIN MAPPING  Indications: Pre-access. Comparison Study: No previous study available to Performing Technologist: Toma Copier RVS  Examination Guidelines: A complete evaluation includes  B-mode imaging, spectral Doppler, color Doppler, and power Doppler as needed of all accessible portions of each vessel. Bilateral testing is considered an integral part of a complete examination. Limited examinations for reoccurring indications may be performed as noted. +-----------------+------------+---------+------------------------------------+ Right Cephalic     Diameter    Depth                Findings                                    (cm)      (cm)                                        +-----------------+------------+---------+------------------------------------+ Shoulder             0.38      1.11                                        +-----------------+------------+---------+------------------------------------+ Prox upper arm       0.44      1.52                                        +-----------------+------------+---------+------------------------------------+ Mid upper arm        0.44      0.77                                        +-----------------+------------+---------+------------------------------------+ Dist upper arm       0.48      0.67                                        +-----------------+------------+---------+------------------------------------+ Antecubital fossa    0.41      0.26                                        +-----------------+------------+---------+------------------------------------+ Prox forearm         0.38      0.39                                        +-----------------+------------+---------+------------------------------------+ Mid forearm  0.29      0.44                                        +-----------------+------------+---------+------------------------------------+ Wrist                                    not visualized and IVsite and                                                         dressing                +-----------------+------------+---------+------------------------------------+ +-----------------+-------------+----------+---------+ Right Basilic    Diameter (cm)Depth (cm)Findings  +-----------------+-------------+----------+---------+ Mid upper arm        0.46        1.34             +-----------------+-------------+----------+---------+ Dist upper arm       0.27        0.83             +-----------------+-------------+----------+---------+ Antecubital fossa    0.28        0.34             +-----------------+-------------+----------+---------+ Prox forearm         0.23        0.24             +-----------------+-------------+----------+---------+ Mid forearm          0.22        0.19             +-----------------+-------------+----------+---------+ Wrist                0.17        0.23   branching +-----------------+-------------+----------+---------+ +-----------------+-------------+----------+--------+ Left Cephalic    Diameter (cm)Depth (cm)Findings +-----------------+-------------+----------+--------+ Shoulder             0.40        1.40            +-----------------+-------------+----------+--------+ Prox upper arm       0.36        0.65            +-----------------+-------------+----------+--------+ Mid upper arm        0.38        0.90            +-----------------+-------------+----------+--------+ Dist upper arm       0.37        0.60            +-----------------+-------------+----------+--------+ Antecubital fossa    0.43        0.36            +-----------------+-------------+----------+--------+ Prox forearm         0.46        0.42            +-----------------+-------------+----------+--------+ Mid forearm          0.30        0.62            +-----------------+-------------+----------+--------+ Wrist                0.13  0.44            +-----------------+-------------+----------+--------+  +-----------------+-------------+----------+-----------------------------------+ Left Basilic     Diameter (cm)Depth (cm)             Findings               +-----------------+-------------+----------+-----------------------------------+ Mid upper arm        0.46        2.60                                       +-----------------+-------------+----------+-----------------------------------+ Dist upper arm       0.34        1.11                                       +-----------------+-------------+----------+-----------------------------------+ Antecubital fossa    0.31        0.63                                       +-----------------+-------------+----------+-----------------------------------+ Prox forearm                                      not visualized            +-----------------+-------------+----------+-----------------------------------+ Mid forearm                                branching, not visualized and                                               multiple branching unable to                                                          identify               +-----------------+-------------+----------+-----------------------------------+ Wrist                                       not visualized and Too smal     +-----------------+-------------+----------+-----------------------------------+ *See table(s) above for measurements and observations.  Diagnosing physician: Harold Barban MD Electronically signed by Harold Barban MD on 08/31/2018 at 8:44:32 PM.    Final    . acetaminophen  1,000 mg Oral Once  . [MAR Hold] darbepoetin (ARANESP) injection - NON-DIALYSIS  40 mcg Subcutaneous Q Wed-1800  . [MAR Hold] febuxostat  40 mg Oral Daily  . [MAR Hold] heparin  5,000 Units Subcutaneous Q8H  . [MAR Hold] hydrALAZINE  100 mg Oral Q8H  . [MAR Hold] insulin aspart  0-9 Units Subcutaneous TID WC  . [MAR Hold] labetalol  400 mg Oral TID  . [MAR Hold]  LORazepam  1 mg Oral QHS  . [  MAR Hold] mupirocin ointment  1 application Nasal BID  . [MAR Hold] predniSONE  40 mg Oral Q breakfast  . [MAR Hold] sertraline  150 mg Oral Daily  . [facility] Hold] sodium chloride flush  3 mL Intravenous Q12H    BMET    Component Value Date/Time   NA 140 09/01/2018 0303   K 3.4 (L) 09/01/2018 0303   CL 106 09/01/2018 0303   CO2 20 (L) 09/01/2018 0303   GLUCOSE 163 (H) 09/01/2018 0303   BUN 81 (H) 09/01/2018 0303   CREATININE 7.65 (H) 09/01/2018 0303   CREATININE 1.57 (H) 08/19/2012 1740   CALCIUM 8.8 (L) 09/01/2018 0303   GFRNONAA 5 (L) 09/01/2018 0303   GFRAA 6 (L) 09/01/2018 0303   CBC    Component Value Date/Time   WBC 6.5 08/31/2018 0229   RBC 3.56 (L) 08/31/2018 0229   HGB 8.7 (L) 08/31/2018 0229   HCT 28.2 (L) 08/31/2018 0229   PLT 289 08/31/2018 0229   MCV 79.2 (L) 08/31/2018 0229   MCV 84.9 07/02/2012 1617   MCH 24.4 (L) 08/31/2018 0229   MCHC 30.9 08/31/2018 0229   RDW 15.4 08/31/2018 0229   LYMPHSABS 2.3 05/05/2012 0915   MONOABS 0.6 05/05/2012 0915   EOSABS 0.0 05/05/2012 0915   BASOSABS 0.0 05/05/2012 0915     Assessment/Plan: 1. progressive CKD to stage 5- pt has been lost to follow up twice at our office and each time she presented again with advanced disease. Her workup in November revealed negative SPEP but she did not have further studies.  1. FeNa >1%, UA with +prot neg blood. 2. Renal US with significant decrease in size compared to 2011 [place] suggesting progressive chronic medical renal disease.  No obstruction 3. This likely represents progressive CKD and may be now at ESRD. Will continue to educate and prepare for HD with vein mapping and VVS consult for access placement. 4. Will hold off on [facility] for now as she is reluctant to start HD at this time and is without uremic symptoms. 2. HTN- poorly controlled. Agree with resuming outpatient medications and will increase hydralazine to three times a day.  1. Would add  clonidine 0.1mg  bid and follow but apparently she described an intolerance.  Will increase labetalol to 400 mg bid and possibly tid if needed.  3. Vascular access- for avf/avg placement today, hold off on [facility] for now 4. Anemia of CKD - and also with low iron stores. Will replete IV iron and will likely require ESA once her BP is under better control 1. nulicit 250mg  IV qd x 4 2. aranesp 40 mcg sq 5. Metabolic acidosis- due to #1. Will start bicarb and follow 6. Pulmonary- mild opacity of the left lung base- await PA and lateral films. covid-19 test pending  7. Secondary HPTH- elevated phos will check iPTH and start binders 8. Pre-diabetes- elevated hgb a1c at 5.7% 9. Chronic systolic CHF- euvolemic on exam 10. HLD- not on meds 11. Gout- currently on prednisone.  Donetta Potts, MD [employer] [phone]

## 2018-09-01 NOTE — Op Note (Signed)
    OPERATIVE REPORT  DATE OF SURGERY: 09/01/2018  PATIENT: Chelsea Roberts, 63 y.o. female MRN: [id number]  DOB: 07/04/1955  PRE-OPERATIVE DIAGNOSIS: Chronic renal insufficiency  POST-OPERATIVE DIAGNOSIS:  Same  PROCEDURE: Left brachiocephalic AV fistula creation  SURGEON:  Curt Jews, M.D.  PHYSICIAN ASSISTANT: Liana Crocker, PA-C  ANESTHESIA: LMA  EBL: per anesthesia record  Total I/O In: 1000 [I.V.:1000] Out: -   BLOOD ADMINISTERED: none  DRAINS: none  SPECIMEN: none  COUNTS CORRECT:  YES  PATIENT DISPOSITION:  PACU - hemodynamically stable  PROCEDURE DETAILS: Patient was taken the operating placed supine straight area of the left heart rate draped in sterile fashion.  SonoSite ultrasound was used to visualize the cephalic vein which was a large caliber at the antecubital space.  Incision was made over the antecubital space and continued down to the cephalic vein which was mobilized proximally distally.  Tributary branches were ligated with 3-0 silk ties and divided.  The vein was of large caliber.  The brachial artery was exposed through the same incision was also of good caliber.  The vein was ligated distally and was mobilized to the level of the brachial artery.  The artery was occluded proximally distally and was opened with an 11 blade and sent longstanding with Potts scissors.  The vein was cut to the appropriate length and was spatulated and sewn end-to-side to the artery with a running 6-0 Prolene suture.  Clamps removed and good thrill was noted.  The wounds were irrigated with saline.  Hemostasis electrocautery.  Wounds were closed with 3-0 Vicryl in the subcutaneous and subcuticular tissue.  Sterile dressing was applied.  Patient continued to have a left radial pulse at the completion of the procedure   [name], M.D., [facility] 09/01/2018 2:03 PM

## 2018-09-01 NOTE — Progress Notes (Signed)
Scheduled labetalol administered per pre-op protocol. Will continue to monitor.  Hiram Comber, RN 09/01/2018 10:31 AM

## 2018-09-01 NOTE — Progress Notes (Signed)
PROGRESS NOTE    Chelsea Roberts  MBW:[id number] DOB: [birth year] DOA: 08/30/2018 PCP: Lucianne Lei, MD   Brief Narrative: 63 year old female with a past medical history significant for poorly controlled hypertension, chronic systolic congestive heart failure, obstructive sleep apnea not on CPAP, gout, chronic kidney disease with a baseline creatinine of 3.51-year back presented to the emergency department with complaints of multiple flareups of gout in multiple joints over the past several weeks.  Patient was experiencing poorly, dizzy associated with the chest pain, having difficulty getting out of the chair secondary to generalized weakness came to the emergency department.  Work-up in the emergency department was found to have systolic blood pressure in 200s and of 7.5, gouty arthritis in multiple joints.  Nephrology is consulted, vascular surgery is consulted for AV graft placement.  ##Acute on chronic renal failure -Secondary to poorly controlled blood pressure with hypertensive nephropathy -Nephrology is consulted -Ultrasound of the kidneys showed a significant decrease in the size compared to 2011 -Undergoing graft placement by vascular surgery 09/01/2018  ##Hypertension accelerated -Continue with hydralazine, increased labetalol 400 mg 3 times a day -Keep the patient on as needed hydralazine, labetalol -Patient is allergic to calcium channel blockers dihydropyridines -Also started the patient on clonidine by nephrology  ##Anemia of chronic kidney disease -Give IV iron, Epogen  ##Gouty arthritis -Continue the prednisone  ##Prediabetes -Hemoglobin A1c is 5.7  ##Chronic systolic congestive heart failure -Keep the patient on IV Lasix Assessment & Plan:   Principal Problem:   Acute renal failure superimposed on chronic kidney disease ([place]) Active Problems:   HTN (hypertension)   Hyperlipidemia   Systolic CHF, chronic (HCC)   Obstructive sleep apnea   Hypothyroidism   Gout    Prediabetes    DVT prophylaxis: Heparin Code Status: Full code  family Communication: Patient, husband  disposition Plan: Home   Consultants:   Nephrology  Vascular surgery  Procedures:   Subjective: Patient is anxious going for the procedure.  Denies any shortness of breath.  Objective: Vitals:   09/01/18 1427 09/01/18 1440 09/01/18 1442 09/01/18 1521  BP: (!) 142/64  (!) 154/69 (!) 157/69  Pulse: 70  72 70  Resp: (!) 22  (!) 22   Temp:    98.2 F (36.8 C)  TempSrc:    Oral  SpO2: 97% 95% 94% 93%  Weight:      Height:        Intake/Output Summary (Last 24 hours) at 09/01/2018 1654 Last data filed at 09/01/2018 1616 Gross per 24 hour  Intake 1964.87 ml  Output 1610 ml  Net 354.87 ml   Filed Weights   08/30/18 0856 08/30/18 1551 09/01/18 1135  Weight: 89.8 kg 88.9 kg 88.9 kg    Examination:  General exam: Appears calm and comfortable  Respiratory system: Clear to auscultation. Respiratory effort normal. Cardiovascular system: S1 & S2 heard, RRR. No JVD, murmurs, rubs, gallops or clicks. No pedal edema. Gastrointestinal system: Abdomen is nondistended, soft and nontender. No organomegaly or masses felt. Normal bowel sounds heard. Central nervous system: Alert and oriented. No focal neurological deficits. Extremities: Symmetric 5 x 5 power. Skin: No rashes, lesions or ulcers Psychiatry: Judgement and insight appear normal. Mood & affect appropriate.     Data Reviewed: I have personally reviewed following labs and imaging studies  CBC: Recent Labs  Lab 08/30/18 0949 08/31/18 0229  WBC 7.7 6.5  HGB 7.7* 8.7*  HCT 25.7* 28.2*  MCV 81.1 79.2*  PLT 213 017   Basic Metabolic  Panel: Recent Labs  Lab 08/30/18 0949 08/30/18 1249 08/31/18 0229 09/01/18 0303  NA 140  --  139 140  K 3.8  --  3.5 3.4*  CL 109  --  107 106  CO2 16*  --  14* 20*  GLUCOSE 129*  --  155* 163*  BUN 71*  --  74* 81*  CREATININE 7.49*  --  7.40* 7.65*  CALCIUM 8.8*   --  9.0 8.8*  MG  --  2.3  --   --   PHOS  --  6.8* 5.4* 5.2*   GFR: Estimated Creatinine Clearance: 8.7 mL/min (A) (by C-G formula based on SCr of 7.65 mg/dL (H)). Liver Function Tests: Recent Labs  Lab 08/31/18 0229 09/01/18 0303  ALBUMIN 2.8* 2.6*   No results for input(s): LIPASE, AMYLASE in the last 168 hours. No results for input(s): AMMONIA in the last 168 hours. Coagulation Profile: No results for input(s): INR, PROTIME in the last 168 hours. Cardiac Enzymes: No results for input(s): CKTOTAL, CKMB, CKMBINDEX, TROPONINI in the last 168 hours. BNP (last 3 results) No results for input(s): PROBNP in the last 8760 hours. HbA1C: Recent Labs    08/30/18 1430  HGBA1C 5.7*   CBG: Recent Labs  Lab 08/31/18 2125 09/01/18 0812 09/01/18 1142 09/01/18 1418 09/01/18 1652  GLUCAP 220* 118* 106* 94 164*   Lipid Profile: Recent Labs    08/31/18 0229  CHOL 222*  HDL 33*  LDLCALC 156*  TRIG 164*  CHOLHDL 6.7   Thyroid Function Tests: Recent Labs    08/30/18 1430  TSH 2.084   Anemia Panel: Recent Labs    08/30/18 1430  VITAMINB12 594  FOLATE 50.4  FERRITIN 226  TIBC 228*  IRON 17*  RETICCTPCT 1.1   Sepsis Labs: No results for input(s): PROCALCITON, LATICACIDVEN in the last 168 hours.  Recent Results (from the past 240 hour(s))  SARS CORONAVIRUS 2 Nasal Swab Aptima Multi Swab     Status: None   Collection Time: 08/30/18  1:03 PM   Specimen: Aptima Multi Swab; Nasal Swab  Result Value Ref Range Status   SARS Coronavirus 2 NEGATIVE NEGATIVE Final    Comment: (NOTE) SARS-CoV-2 target nucleic acids are NOT DETECTED. The SARS-CoV-2 RNA is generally detectable in upper and lower respiratory specimens during the acute phase of infection. Negative results do not preclude SARS-CoV-2 infection, do not rule out co-infections with other pathogens, and should not be used as the sole basis for treatment or other patient management decisions. Negative results must  be combined with clinical observations, patient history, and epidemiological information. The expected result is Negative. Fact Sheet for Patients: SugarRoll.be Fact Sheet for Healthcare Providers: https://www.woods-mathews.com/ This test is not yet approved or cleared by the [place] FDA and  has been authorized for detection and/or diagnosis of SARS-CoV-2 by FDA under an Emergency Use Authorization (EUA). This EUA will remain  in effect (meaning this test can be used) for the duration of the COVID-19 declaration under Section 56 4(b)(1) of the Act, 21 U.S.C. section 360bbb-3(b)(1), unless the authorization is terminated or revoked sooner. Performed at [facility], [place] [address], [place], [place] [address]   Surgical PCR screen     Status: Abnormal   Collection Time: 09/01/18  4:31 AM   Specimen: Nasal Mucosa; Nasal Swab  Result Value Ref Range Status   MRSA, PCR NEGATIVE NEGATIVE Final   Staphylococcus aureus POSITIVE (A) NEGATIVE Final    Comment: (NOTE) The Xpert SA  Assay (FDA approved for NASAL specimens in patients 21 years of age and older), is one component of a comprehensive surveillance program. It is not intended to diagnose infection nor to guide or monitor treatment. Performed at [facility], [place] [address] [place], [place] [address]          Radiology Studies: Dg Chest 2 View  Result Date: 08/30/2018 CLINICAL DATA:  Tachypnea.  Chest pain. EXAM: CHEST - 2 VIEW COMPARISON:  08/30/18 FINDINGS: Cardiac enlargement, unchanged. No pleural effusion. Increase patchy scratch set pulmonary vascular congestion is identified. New patchy opacity within the left midlung identified which may represent asymmetric edema versus pneumonia. IMPRESSION: 1. Cardiac enlargement and pulmonary vascular congestion with new patchy left midlung opacity which may represent asymmetric edema versus pneumonia. Electronically Signed    By: Kerby Moors M.D.   On: 08/30/2018 18:09   US Renal  Result Date: 08/30/2018 CLINICAL DATA:  Acute renal failure. EXAM: RENAL / URINARY TRACT ULTRASOUND COMPLETE COMPARISON:  CT, 04/19/2009. FINDINGS: Right Kidney: Renal measurements: 9.9 x 4.8 x 5.4 cm = volume: 135.7 mL. Increased parenchymal echogenicity. Multiple cysts. In the midpole, at the midpole, there is a cortical cyst in a thin septation, but no other complicating feature, measuring 1.9 x 1.5 x 1.6 cm. There is also a hyperechoic renal mass, lower pole, measuring 1.5 x 1.3 x 1.5 cm. This corresponds to an angiomyolipoma present on the prior CT. No stones. No hydronephrosis. Left Kidney: Renal measurements: 9.0 x 4.7 x 5.3 cm = volume: 119.3 mL. Increased parenchymal echogenicity. Simple appearing midpole cyst measuring 2.0 x 1.6 x 1.9 cm. No stones. No hydronephrosis. Bladder: Appears normal for degree of bladder distention. Hyperechoic lesion in the spleen measuring 1.6 x 1.4 x 1.6 cm, not evident on the prior CT, likely a hemangioma. IMPRESSION: 1. No acute findings. 2. Increased renal parenchymal echogenicity consistent with medical renal disease. 3. Renal cysts.  Right renal angiomyolipoma. 4. No stones.  No hydronephrosis. Electronically Signed   By: Lajean Manes M.D.   On: 08/30/2018 19:02   Vas [place] Upper Ext Vein Mapping (pre-op Avf)  Result Date: 08/31/2018 UPPER EXTREMITY VEIN MAPPING  Indications: Pre-access. Comparison Study: No previous study available to Performing Technologist: Toma Copier RVS  Examination Guidelines: A complete evaluation includes B-mode imaging, spectral Doppler, color Doppler, and power Doppler as needed of all accessible portions of each vessel. Bilateral testing is considered an integral part of a complete examination. Limited examinations for reoccurring indications may be performed as noted. +-----------------+------------+---------+------------------------------------+ Right Cephalic      Diameter    Depth                Findings                                    (cm)      (cm)                                        +-----------------+------------+---------+------------------------------------+ Shoulder             0.38      1.11                                        +-----------------+------------+---------+------------------------------------+  Prox upper arm       0.44      1.52                                        +-----------------+------------+---------+------------------------------------+ Mid upper arm        0.44      0.77                                        +-----------------+------------+---------+------------------------------------+ Dist upper arm       0.48      0.67                                        +-----------------+------------+---------+------------------------------------+ Antecubital fossa    0.41      0.26                                        +-----------------+------------+---------+------------------------------------+ Prox forearm         0.38      0.39                                        +-----------------+------------+---------+------------------------------------+ Mid forearm          0.29      0.44                                        +-----------------+------------+---------+------------------------------------+ Wrist                                    not visualized and IVsite and                                                         dressing               +-----------------+------------+---------+------------------------------------+ +-----------------+-------------+----------+---------+ Right Basilic    Diameter (cm)Depth (cm)Findings  +-----------------+-------------+----------+---------+ Mid upper arm        0.46        1.34             +-----------------+-------------+----------+---------+ Dist upper arm       0.27        0.83              +-----------------+-------------+----------+---------+ Antecubital fossa    0.28        0.34             +-----------------+-------------+----------+---------+ Prox forearm         0.23        0.24             +-----------------+-------------+----------+---------+ Mid forearm          0.22  0.19             +-----------------+-------------+----------+---------+ Wrist                0.17        0.23   branching +-----------------+-------------+----------+---------+ +-----------------+-------------+----------+--------+ Left Cephalic    Diameter (cm)Depth (cm)Findings +-----------------+-------------+----------+--------+ Shoulder             0.40        1.40            +-----------------+-------------+----------+--------+ Prox upper arm       0.36        0.65            +-----------------+-------------+----------+--------+ Mid upper arm        0.38        0.90            +-----------------+-------------+----------+--------+ Dist upper arm       0.37        0.60            +-----------------+-------------+----------+--------+ Antecubital fossa    0.43        0.36            +-----------------+-------------+----------+--------+ Prox forearm         0.46        0.42            +-----------------+-------------+----------+--------+ Mid forearm          0.30        0.62            +-----------------+-------------+----------+--------+ Wrist                0.13        0.44            +-----------------+-------------+----------+--------+ +-----------------+-------------+----------+-----------------------------------+ Left Basilic     Diameter (cm)Depth (cm)             Findings               +-----------------+-------------+----------+-----------------------------------+ Mid upper arm        0.46        2.60                                       +-----------------+-------------+----------+-----------------------------------+ Dist upper arm        0.34        1.11                                       +-----------------+-------------+----------+-----------------------------------+ Antecubital fossa    0.31        0.63                                       +-----------------+-------------+----------+-----------------------------------+ Prox forearm                                      not visualized            +-----------------+-------------+----------+-----------------------------------+ Mid forearm                                branching, not visualized  and                                               multiple branching unable to                                                          identify               +-----------------+-------------+----------+-----------------------------------+ Wrist                                       not visualized and Too smal     +-----------------+-------------+----------+-----------------------------------+ *See table(s) above for measurements and observations.  Diagnosing physician: Harold Barban MD Electronically signed by Harold Barban MD on 08/31/2018 at 8:44:32 PM.    Final         Scheduled Meds: . darbepoetin (ARANESP) injection - NON-DIALYSIS  40 mcg Subcutaneous Q Wed-1800  . febuxostat  40 mg Oral Daily  . heparin  5,000 Units Subcutaneous Q8H  . hydrALAZINE  100 mg Oral Q8H  . insulin aspart  0-9 Units Subcutaneous TID WC  . labetalol  400 mg Oral TID  . LORazepam  1 mg Oral QHS  . mupirocin ointment  1 application Nasal BID  . predniSONE  40 mg Oral Q breakfast  . sertraline  150 mg Oral Daily  . sodium chloride flush  3 mL Intravenous Q12H   Continuous Infusions: . sodium chloride 10 mL/hr at 09/01/18 1145  . ferric gluconate (FERRLECIT/NULECIT) IV 250 mg (09/01/18 1530)  .  sodium bicarbonate (isotonic) infusion in sterile water 50 mL/hr at 09/01/18 1533     LOS: 2 days    Time spent: 35 minutes    River Ambrosio, MD [facility]  Pager [phone]  If 7PM-7AM, please contact night-coverage [email or web address] Password [facility] 09/01/2018, 4:54 PM

## 2018-09-01 NOTE — Progress Notes (Signed)
   09/01/18 0951  Vitals  BP (!) 190/102  MAP (mmHg) 127    Dr. Lunette Stands paged regarding patient's blood pressure. Patient extremely anxious about upcoming procedure. Lavender oil administered. Will continue to monitor.   Hiram Comber, RN 09/01/2018 10:09 AM

## 2018-09-01 NOTE — Interval H&P Note (Signed)
History and Physical Interval Note:  09/01/2018 12:16 PM  Chelsea Roberts  has presented today for surgery, with the diagnosis of chronic kidney disease.  The various methods of treatment have been discussed with the patient and family. After consideration of risks, benefits and other options for treatment, the patient has consented to  Procedure(s): ARTERIOVENOUS (AV) FISTULA CREATION VERSUS GRAFT (Left) as a surgical intervention.  The patient's history has been reviewed, patient examined, no change in status, stable for surgery.  I have reviewed the patient's chart and labs.  Questions were answered to the patient's satisfaction.     Curt Jews

## 2018-09-01 NOTE — Anesthesia Procedure Notes (Signed)
Procedure Name: LMA Insertion Date/Time: 09/01/2018 1:04 PM Performed by: Leonor Liv, CRNA Pre-anesthesia Checklist: Patient identified, Emergency Drugs available, Suction available and Patient being monitored Patient Re-evaluated:Patient Re-evaluated prior to induction Oxygen Delivery Method: Circle System Utilized Preoxygenation: Pre-oxygenation with 100% oxygen Induction Type: IV induction Ventilation: Mask ventilation without difficulty LMA: LMA inserted LMA Size: 4.0 Number of attempts: 1 Airway Equipment and Method: Bite block Placement Confirmation: positive ETCO2 Tube secured with: Tape Dental Injury: Teeth and Oropharynx as per pre-operative assessment

## 2018-09-01 NOTE — Transfer of Care (Signed)
Immediate Anesthesia Transfer of Care Note  Patient: AMYRA VANTUYL  Procedure(s) Performed: CREATION OF ARTERIOVENOUS (AV) FISTULA LEFT ARM (Left )  Patient Location: PACU  Anesthesia Type:General  Level of Consciousness: drowsy and patient cooperative  Airway & Oxygen Therapy: Patient Spontanous Breathing and Patient connected to nasal cannula oxygen  Post-op Assessment: Report given to RN, Post -op Vital signs reviewed and stable and Patient moving all extremities  Post vital signs: Reviewed and stable  Last Vitals:  Vitals Value Taken Time  BP    Temp    Pulse 71 09/01/18 1412  Resp 24 09/01/18 1412  SpO2 93 % 09/01/18 1412  Vitals shown include unvalidated device data.  Last Pain:  Vitals:   09/01/18 0537  TempSrc: Oral  PainSc:          Complications: No apparent anesthesia complications

## 2018-09-01 NOTE — Plan of Care (Signed)

## 2018-09-01 NOTE — Discharge Instructions (Signed)
° °  [facility]  Discharge Instructions  AV Fistula or Graft Surgery for Dialysis Access  Please refer to the following instructions for your post-procedure care. Your surgeon or physician assistant will discuss any changes with you.  Activity  You may drive the day following your surgery, if you are comfortable and no longer taking prescription pain medication. Resume full activity as the soreness in your incision resolves.  Bathing/Showering  You may shower after you go home. Keep your incision dry for 48 hours. Do not soak in a bathtub, hot tub, or swim until the incision heals completely. You may not shower if you have a hemodialysis catheter.  Incision Care  Clean your incision with mild soap and water after 48 hours. Pat the area dry with a clean towel. You do not need a bandage unless otherwise instructed. Do not apply any ointments or creams to your incision. You may have skin glue on your incision. Do not peel it off. It will come off on its own in about one week. Your arm may swell a bit after surgery. To reduce swelling use pillows to elevate your arm so it is above your heart. Your doctor will tell you if you need to lightly wrap your arm with an ACE bandage.  Diet  Resume your normal diet. There are not special food restrictions following this procedure. In order to heal from your surgery, it is CRITICAL to get adequate nutrition. Your body requires vitamins, minerals, and protein. Vegetables are the best source of vitamins and minerals. Vegetables also provide the perfect balance of protein. Processed food has little nutritional value, so try to avoid this.  Medications  Resume taking all of your medications. If your incision is causing pain, you may take over-the counter pain relievers such as acetaminophen (Tylenol). If you were prescribed a stronger pain medication, please be aware these medications can cause nausea and constipation. Prevent  nausea by taking the medication with a snack or meal. Avoid constipation by drinking plenty of fluids and eating foods with high amount of fiber, such as fruits, vegetables, and grains.  Do not take Tylenol if you are taking prescription pain medications.  Follow up Your surgeon may want to see you in the office following your access surgery. If so, this will be arranged at the time of your surgery.  Please call us immediately for any of the following conditions:  Increased pain, redness, drainage (pus) from your incision site Fever of 101 degrees or higher Severe or worsening pain at your incision site Hand pain or numbness.  Reduce your risk of vascular disease:  Stop smoking. If you would like help, call [facility] at 1-800-QUIT-NOW 708-667-0790) or [place] at [place] your cholesterol Maintain a desired weight Control your diabetes Keep your blood pressure down  Dialysis  It will take several weeks to several months for your new dialysis access to be ready for use. Your surgeon will determine when it is okay to use it. Your nephrologist will continue to direct your dialysis. You can continue to use your Permcath until your new access is ready for use.   09/01/2018 Chelsea Roberts [id number] 11/24/1955  Surgeon(s): Early, Arvilla Meres, MD  Procedure(s): Creation left brachial cephalic AV fistula  x Do not stick fistula for 12 weeks    If you have any questions, please call the office at [phone].

## 2018-09-01 NOTE — Anesthesia Postprocedure Evaluation (Signed)
Anesthesia Post Note  Patient: Chelsea Roberts  Procedure(s) Performed: CREATION OF ARTERIOVENOUS (AV) FISTULA LEFT ARM (Left )     Patient location during evaluation: PACU Anesthesia Type: General Level of consciousness: sedated Pain management: pain level controlled Vital Signs Assessment: post-procedure vital signs reviewed and stable Respiratory status: spontaneous breathing and respiratory function stable Cardiovascular status: stable Postop Assessment: no apparent nausea or vomiting Anesthetic complications: no    Last Vitals:  Vitals:   09/01/18 1440 09/01/18 1442  BP:  (!) 154/69  Pulse:  72  Resp:  (!) 22  Temp:    SpO2: 95% 94%    Last Pain:  Vitals:   09/01/18 1440  TempSrc:   PainSc: 6                  Nissim Fleischer DANIEL

## 2018-09-01 NOTE — Progress Notes (Signed)
Patient back to room from PACU. Groggy, but aert and oriented x 4. Denies any complaints. Bed placed in lowest position and patient instructed to call for assistance. Vital signs obtained and stable. Will continue to monitor.  Hiram Comber, RN 09/01/2018 3:23 PM

## 2018-09-01 NOTE — Anesthesia Preprocedure Evaluation (Signed)
Anesthesia Evaluation  Patient identified by MRN, date of birth, ID band Patient awake    Reviewed: Allergy & Precautions, NPO status , Patient's Chart, lab work & pertinent test results  Airway Mallampati: III  TM Distance: >3 FB Neck ROM: Full    Dental no notable dental hx. (+) Dental Advisory Given   Pulmonary sleep apnea ,    Pulmonary exam normal        Cardiovascular hypertension, Pt. on home beta blockers +CHF  Normal cardiovascular exam     Neuro/Psych PSYCHIATRIC DISORDERS Anxiety Depression negative neurological ROS     GI/Hepatic negative GI ROS, Neg liver ROS,   Endo/Other  Hypothyroidism   Renal/GU ESRF and DialysisRenal disease  negative genitourinary   Musculoskeletal negative musculoskeletal ROS (+)   Abdominal   Peds negative pediatric ROS (+)  Hematology negative hematology ROS (+)   Anesthesia Other Findings   Reproductive/Obstetrics negative OB ROS                             Anesthesia Physical Anesthesia Plan  ASA: III  Anesthesia Plan: General   Post-op Pain Management:    Induction: Intravenous  PONV Risk Score and Plan: 3 and Ondansetron, Dexamethasone and Diphenhydramine  Airway Management Planned: LMA  Additional Equipment:   Intra-op Plan:   Post-operative Plan: Extubation in OR  Informed Consent: I have reviewed the patients History and Physical, chart, labs and discussed the procedure including the risks, benefits and alternatives for the proposed anesthesia with the patient or authorized representative who has indicated his/her understanding and acceptance.     Dental advisory given  Plan Discussed with: CRNA and Anesthesiologist  Anesthesia Plan Comments:         Anesthesia Quick Evaluation

## 2018-09-02 ENCOUNTER — Encounter (HOSPITAL_COMMUNITY): Payer: Self-pay | Admitting: Vascular Surgery

## 2018-09-02 DIAGNOSIS — G4733 Obstructive sleep apnea (adult) (pediatric): Secondary | ICD-10-CM

## 2018-09-02 LAB — GLUCOSE, CAPILLARY
Glucose-Capillary: 107 mg/dL — ABNORMAL HIGH (ref 70–99)
Glucose-Capillary: 226 mg/dL — ABNORMAL HIGH (ref 70–99)
Glucose-Capillary: 145 mg/dL — ABNORMAL HIGH (ref 70–99)
Glucose-Capillary: 189 mg/dL — ABNORMAL HIGH (ref 70–99)

## 2018-09-02 LAB — RENAL FUNCTION PANEL
Albumin: 2.6 g/dL — ABNORMAL LOW (ref 3.5–5.0)
Anion gap: 16 — ABNORMAL HIGH (ref 5–15)
BUN: 74 mg/dL — ABNORMAL HIGH (ref 8–23)
CO2: 23 mmol/L (ref 22–32)
Calcium: 8.5 mg/dL — ABNORMAL LOW (ref 8.9–10.3)
Chloride: 103 mmol/L (ref 98–111)
Creatinine, Ser: 7.65 mg/dL — ABNORMAL HIGH (ref 0.44–1.00)
GFR calc Af Amer: 6 mL/min — ABNORMAL LOW (ref >60)
GFR calc non Af Amer: 5 mL/min — ABNORMAL LOW (ref >60)
Glucose, Bld: 99 mg/dL (ref 70–99)
Phosphorus: 7.2 mg/dL — ABNORMAL HIGH (ref 2.5–4.6)
Potassium: 3.3 mmol/L — ABNORMAL LOW (ref 3.5–5.1)
Sodium: 142 mmol/L (ref 135–145)

## 2018-09-02 MED ORDER — POTASSIUM CHLORIDE 20 MEQ PO PACK
40.0000 meq | PACK | Freq: Once | ORAL | Status: AC
  Administered 2018-09-02: 40 meq via ORAL
  Filled 2018-09-02: qty 2

## 2018-09-02 NOTE — Plan of Care (Signed)

## 2018-09-02 NOTE — Progress Notes (Signed)
PROGRESS NOTE    Chelsea Roberts  KDT:[id number] DOB: [birth year] DOA: 08/30/2018 PCP: Lucianne Lei, MD   Brief Narrative: 63 year old female with a past medical history significant for poorly controlled hypertension, chronic systolic congestive heart failure, obstructive sleep apnea not on CPAP, gout, chronic kidney disease with a baseline creatinine of 3.51-year back presented to the emergency department with complaints of multiple flareups of gout in multiple joints over the past several weeks.  Patient was experiencing poorly, dizzy associated with the chest pain, having difficulty getting out of the chair secondary to generalized weakness came to the emergency department.  Work-up in the emergency department was found to have systolic blood pressure in 200s and of 7.5, gouty arthritis in multiple joints.  Nephrology is consulted, vascular surgery is consulted for AV graft placement.  ##Acute on chronic renal failure -Secondary to poorly controlled blood pressure with hypertensive nephropathy -Nephrology is consulted -Ultrasound of the kidneys showed a significant decrease in the size compared to 2011 -Underwent AV graft placement by vascular surgery 09/01/2018 to the left arm  ##Hypertension accelerated -Continue with hydralazine, increased labetalol 400 mg 3 times a day -Keep the patient on as needed hydralazine, labetalol -Patient continues to have improvement  ##Anemia of chronic kidney disease -Give IV iron, Epogen  ##Gouty arthritis -Continue the prednisone -Good improvement  ##Prediabetes -Hemoglobin A1c is 5.7  ##Chronic systolic congestive heart failure -Keep the patient on IV Lasix  Hyperlipidemia -Patient has elevated cholesterol, triglycerides, LDL -Continue with the statin  Assessment & Plan:   Principal Problem:   Acute renal failure superimposed on chronic kidney disease ([place]) Active Problems:   HTN (hypertension)   Hyperlipidemia   Systolic CHF, chronic  (HCC)   Obstructive sleep apnea   Hypothyroidism   Gout   Prediabetes    DVT prophylaxis: Heparin Code Status: Full code  family Communication: Patient, husband  disposition Plan: Home   Consultants:   Nephrology  Vascular surgery  Procedures:   Subjective: Complaining of pain in the left antecubital area where his AV fistula is placed  Objective: Vitals:   09/01/18 2125 09/02/18 0518 09/02/18 0529 09/02/18 1056  BP: (!) 144/72 (!) 167/82 (!) 152/83 (!) 145/65  Pulse: 69 71 79 76  Resp:      Temp: 98 F (36.7 C) 98 F (36.7 C) 99.1 F (37.3 C)   TempSrc:      SpO2: 94% 96% 100%   Weight:      Height:        Intake/Output Summary (Last 24 hours) at 09/02/2018 1313 Last data filed at 09/02/2018 0800 Gross per 24 hour  Intake 2576.72 ml  Output 710 ml  Net 1866.72 ml   Filed Weights   08/30/18 0856 08/30/18 1551 09/01/18 1135  Weight: 89.8 kg 88.9 kg 88.9 kg    Examination:  General exam: Appears calm and comfortable  Respiratory system: Clear to auscultation. Respiratory effort normal. Cardiovascular system: S1 & S2 heard, RRR. No JVD, murmurs, rubs, gallops or clicks. No pedal edema. Gastrointestinal system: Abdomen is nondistended, soft and nontender. No organomegaly or masses felt. Normal bowel sounds heard. Central nervous system: Alert and oriented. No focal neurological deficits. Extremities: Symmetric 5 x 5 power.  Mild swelling in the antecubital area of the left arm Skin: No rashes, lesions or ulcers Psychiatry: Judgement and insight appear normal. Mood & affect appropriate.     Data Reviewed: I have personally reviewed following labs and imaging studies  CBC: Recent Labs  Lab 08/30/18 0949  08/31/18 0229  WBC 7.7 6.5  HGB 7.7* 8.7*  HCT 25.7* 28.2*  MCV 81.1 79.2*  PLT 213 716   Basic Metabolic Panel: Recent Labs  Lab 08/30/18 0949 08/30/18 1249 08/31/18 0229 09/01/18 0303 09/02/18 0726  NA 140  --  139 140 142  K 3.8  --   3.5 3.4* 3.3*  CL 109  --  107 106 103  CO2 16*  --  14* 20* 23  GLUCOSE 129*  --  155* 163* 99  BUN 71*  --  74* 81* 74*  CREATININE 7.49*  --  7.40* 7.65* 7.65*  CALCIUM 8.8*  --  9.0 8.8* 8.5*  MG  --  2.3  --   --   --   PHOS  --  6.8* 5.4* 5.2* 7.2*   GFR: Estimated Creatinine Clearance: 8.7 mL/min (A) (by C-G formula based on SCr of 7.65 mg/dL (H)). Liver Function Tests: Recent Labs  Lab 08/31/18 0229 09/01/18 0303 09/02/18 0726  ALBUMIN 2.8* 2.6* 2.6*   No results for input(s): LIPASE, AMYLASE in the last 168 hours. No results for input(s): AMMONIA in the last 168 hours. Coagulation Profile: No results for input(s): INR, PROTIME in the last 168 hours. Cardiac Enzymes: No results for input(s): CKTOTAL, CKMB, CKMBINDEX, TROPONINI in the last 168 hours. BNP (last 3 results) No results for input(s): PROBNP in the last 8760 hours. HbA1C: Recent Labs    08/30/18 1430  HGBA1C 5.7*   CBG: Recent Labs  Lab 09/01/18 1418 09/01/18 1652 09/01/18 2123 09/02/18 0746 09/02/18 1240  GLUCAP 94 164* 160* 107* 226*   Lipid Profile: Recent Labs    08/31/18 0229  CHOL 222*  HDL 33*  LDLCALC 156*  TRIG 164*  CHOLHDL 6.7   Thyroid Function Tests: Recent Labs    08/30/18 1430  TSH 2.084   Anemia Panel: Recent Labs    08/30/18 1430  VITAMINB12 594  FOLATE 50.4  FERRITIN 226  TIBC 228*  IRON 17*  RETICCTPCT 1.1   Sepsis Labs: No results for input(s): PROCALCITON, LATICACIDVEN in the last 168 hours.  Recent Results (from the past 240 hour(s))  SARS CORONAVIRUS 2 Nasal Swab Aptima Multi Swab     Status: None   Collection Time: 08/30/18  1:03 PM   Specimen: Aptima Multi Swab; Nasal Swab  Result Value Ref Range Status   SARS Coronavirus 2 NEGATIVE NEGATIVE Final    Comment: (NOTE) SARS-CoV-2 target nucleic acids are NOT DETECTED. The SARS-CoV-2 RNA is generally detectable in upper and lower respiratory specimens during the acute phase of infection. Negative  results do not preclude SARS-CoV-2 infection, do not rule out co-infections with other pathogens, and should not be used as the sole basis for treatment or other patient management decisions. Negative results must be combined with clinical observations, patient history, and epidemiological information. The expected result is Negative. Fact Sheet for Patients: SugarRoll.be Fact Sheet for Healthcare Providers: https://www.woods-mathews.com/ This test is not yet approved or cleared by the [place] FDA and  has been authorized for detection and/or diagnosis of SARS-CoV-2 by FDA under an Emergency Use Authorization (EUA). This EUA will remain  in effect (meaning this test can be used) for the duration of the COVID-19 declaration under Section 56 4(b)(1) of the Act, 21 U.S.C. section 360bbb-3(b)(1), unless the authorization is terminated or revoked sooner. Performed at [facility], [place] [address] [place], [place] [address]   Surgical PCR screen     Status: Abnormal  Collection Time: 09/01/18  4:31 AM   Specimen: Nasal Mucosa; Nasal Swab  Result Value Ref Range Status   MRSA, PCR NEGATIVE NEGATIVE Final   Staphylococcus aureus POSITIVE (A) NEGATIVE Final    Comment: (NOTE) The Xpert SA Assay (FDA approved for NASAL specimens in patients 10 years of age and older), is one component of a comprehensive surveillance program. It is not intended to diagnose infection nor to guide or monitor treatment. Performed at [facility], [place] [address], [place], [place] [address]          Radiology Studies: Vas [place] Upper Ext Vein Mapping (pre-op Avf)  Result Date: 08/31/2018 UPPER EXTREMITY VEIN MAPPING  Indications: Pre-access. Comparison Study: No previous study available to Performing Technologist: Toma Copier RVS  Examination Guidelines: A complete evaluation includes B-mode imaging, spectral Doppler, color Doppler, and power  Doppler as needed of all accessible portions of each vessel. Bilateral testing is considered an integral part of a complete examination. Limited examinations for reoccurring indications may be performed as noted. +-----------------+------------+---------+------------------------------------+ Right Cephalic     Diameter    Depth                Findings                                    (cm)      (cm)                                        +-----------------+------------+---------+------------------------------------+ Shoulder             0.38      1.11                                        +-----------------+------------+---------+------------------------------------+ Prox upper arm       0.44      1.52                                        +-----------------+------------+---------+------------------------------------+ Mid upper arm        0.44      0.77                                        +-----------------+------------+---------+------------------------------------+ Dist upper arm       0.48      0.67                                        +-----------------+------------+---------+------------------------------------+ Antecubital fossa    0.41      0.26                                        +-----------------+------------+---------+------------------------------------+ Prox forearm         0.38      0.39                                        +-----------------+------------+---------+------------------------------------+  Mid forearm          0.29      0.44                                        +-----------------+------------+---------+------------------------------------+ Wrist                                    not visualized and IVsite and                                                         dressing               +-----------------+------------+---------+------------------------------------+ +-----------------+-------------+----------+---------+  Right Basilic    Diameter (cm)Depth (cm)Findings  +-----------------+-------------+----------+---------+ Mid upper arm        0.46        1.34             +-----------------+-------------+----------+---------+ Dist upper arm       0.27        0.83             +-----------------+-------------+----------+---------+ Antecubital fossa    0.28        0.34             +-----------------+-------------+----------+---------+ Prox forearm         0.23        0.24             +-----------------+-------------+----------+---------+ Mid forearm          0.22        0.19             +-----------------+-------------+----------+---------+ Wrist                0.17        0.23   branching +-----------------+-------------+----------+---------+ +-----------------+-------------+----------+--------+ Left Cephalic    Diameter (cm)Depth (cm)Findings +-----------------+-------------+----------+--------+ Shoulder             0.40        1.40            +-----------------+-------------+----------+--------+ Prox upper arm       0.36        0.65            +-----------------+-------------+----------+--------+ Mid upper arm        0.38        0.90            +-----------------+-------------+----------+--------+ Dist upper arm       0.37        0.60            +-----------------+-------------+----------+--------+ Antecubital fossa    0.43        0.36            +-----------------+-------------+----------+--------+ Prox forearm         0.46        0.42            +-----------------+-------------+----------+--------+ Mid forearm          0.30        0.62            +-----------------+-------------+----------+--------+ Wrist  0.13        0.44            +-----------------+-------------+----------+--------+ +-----------------+-------------+----------+-----------------------------------+ Left Basilic     Diameter (cm)Depth (cm)             Findings                +-----------------+-------------+----------+-----------------------------------+ Mid upper arm        0.46        2.60                                       +-----------------+-------------+----------+-----------------------------------+ Dist upper arm       0.34        1.11                                       +-----------------+-------------+----------+-----------------------------------+ Antecubital fossa    0.31        0.63                                       +-----------------+-------------+----------+-----------------------------------+ Prox forearm                                      not visualized            +-----------------+-------------+----------+-----------------------------------+ Mid forearm                                branching, not visualized and                                               multiple branching unable to                                                          identify               +-----------------+-------------+----------+-----------------------------------+ Wrist                                       not visualized and Too smal     +-----------------+-------------+----------+-----------------------------------+ *See table(s) above for measurements and observations.  Diagnosing physician: Harold Barban MD Electronically signed by Harold Barban MD on 08/31/2018 at 8:44:32 PM.    Final         Scheduled Meds: . darbepoetin (ARANESP) injection - NON-DIALYSIS  40 mcg Subcutaneous Q Wed-1800  . febuxostat  40 mg Oral Daily  . heparin  5,000 Units Subcutaneous Q8H  . hydrALAZINE  100 mg Oral Q8H  . insulin aspart  0-9 Units Subcutaneous TID WC  . labetalol  400 mg Oral TID  . LORazepam  1 mg Oral QHS  . mupirocin ointment  1 application Nasal BID  .  predniSONE  40 mg Oral Q breakfast  . sertraline  150 mg Oral Daily  . sodium chloride flush  3 mL Intravenous Q12H   Continuous Infusions: . ferric gluconate  (FERRLECIT/NULECIT) IV 250 mg (09/02/18 1028)     LOS: 3 days    Time spent: 54 minutes    Zacharee Gaddie, MD [facility] Pager [phone]  If 7PM-7AM, please contact night-coverage [email or web address] Password [email or web address] 09/02/2018, 1:13 PM

## 2018-09-02 NOTE — Progress Notes (Addendum)
Patient ID: Chelsea Roberts, female   DOB: 07-08-1955, 63 y.o.   MRN: [id number] S: No new complaints and has tolerated LUE AVF creation today O:BP (!) 145/65 (BP Location: Right Arm)   Pulse 76   Temp 99.1 F (37.3 C)   Resp (!) 22   Ht 5' 5.98" (1.676 m)   Wt 88.9 kg   SpO2 100%   BMI 31.65 kg/m   Intake/Output Summary (Last 24 hours) at 09/02/2018 1218 Last data filed at 09/02/2018 0800 Gross per 24 hour  Intake 2576.72 ml  Output 710 ml  Net 1866.72 ml   Intake/Output: I/O last 3 completed shifts: In: 3336.7 [P.O.:680; I.V.:2564.7; IV Piggyback:92] Out: 2960 [Urine:2950; Blood:10]  Intake/Output this shift:  Total I/O In: 240 [P.O.:240] Out: -  Weight change:  Gen: NAD CVS: no rub Resp: cta Abd: benign Ext: tophi on great toes bilaterally, no edema, LUE AVF +T/B  Recent Labs  Lab 08/30/18 0949 08/30/18 1249 08/31/18 0229 09/01/18 0303 09/02/18 0726  NA 140  --  139 140 142  K 3.8  --  3.5 3.4* 3.3*  CL 109  --  107 106 103  CO2 16*  --  14* 20* 23  GLUCOSE 129*  --  155* 163* 99  BUN 71*  --  74* 81* 74*  CREATININE 7.49*  --  7.40* 7.65* 7.65*  ALBUMIN  --   --  2.8* 2.6* 2.6*  CALCIUM 8.8*  --  9.0 8.8* 8.5*  PHOS  --  6.8* 5.4* 5.2* 7.2*   Liver Function Tests: Recent Labs  Lab 08/31/18 0229 09/01/18 0303 09/02/18 0726  ALBUMIN 2.8* 2.6* 2.6*   No results for input(s): LIPASE, AMYLASE in the last 168 hours. No results for input(s): AMMONIA in the last 168 hours. CBC: Recent Labs  Lab 08/30/18 0949 08/31/18 0229  WBC 7.7 6.5  HGB 7.7* 8.7*  HCT 25.7* 28.2*  MCV 81.1 79.2*  PLT 213 289   Cardiac Enzymes: No results for input(s): CKTOTAL, CKMB, CKMBINDEX, TROPONINI in the last 168 hours. CBG: Recent Labs  Lab 09/01/18 1142 09/01/18 1418 09/01/18 1652 09/01/18 2123 09/02/18 0746  GLUCAP 106* 94 164* 160* 107*    Iron Studies:  Recent Labs    08/30/18 1430  IRON 17*  TIBC 228*  FERRITIN 226   Studies/Results: Vas [place] Upper  Ext Vein Mapping (pre-op Avf)  Result Date: 08/31/2018 UPPER EXTREMITY VEIN MAPPING  Indications: Pre-access. Comparison Study: No previous study available to Performing Technologist: Toma Copier RVS  Examination Guidelines: A complete evaluation includes B-mode imaging, spectral Doppler, color Doppler, and power Doppler as needed of all accessible portions of each vessel. Bilateral testing is considered an integral part of a complete examination. Limited examinations for reoccurring indications may be performed as noted. +-----------------+------------+---------+------------------------------------+ Right Cephalic     Diameter    Depth                Findings                                    (cm)      (cm)                                        +-----------------+------------+---------+------------------------------------+ Shoulder  0.38      1.11                                        +-----------------+------------+---------+------------------------------------+ Prox upper arm       0.44      1.52                                        +-----------------+------------+---------+------------------------------------+ Mid upper arm        0.44      0.77                                        +-----------------+------------+---------+------------------------------------+ Dist upper arm       0.48      0.67                                        +-----------------+------------+---------+------------------------------------+ Antecubital fossa    0.41      0.26                                        +-----------------+------------+---------+------------------------------------+ Prox forearm         0.38      0.39                                        +-----------------+------------+---------+------------------------------------+ Mid forearm          0.29      0.44                                         +-----------------+------------+---------+------------------------------------+ Wrist                                    not visualized and IVsite and                                                         dressing               +-----------------+------------+---------+------------------------------------+ +-----------------+-------------+----------+---------+ Right Basilic    Diameter (cm)Depth (cm)Findings  +-----------------+-------------+----------+---------+ Mid upper arm        0.46        1.34             +-----------------+-------------+----------+---------+ Dist upper arm       0.27        0.83             +-----------------+-------------+----------+---------+ Antecubital fossa    0.28        0.34             +-----------------+-------------+----------+---------+ Prox forearm  0.23        0.24             +-----------------+-------------+----------+---------+ Mid forearm          0.22        0.19             +-----------------+-------------+----------+---------+ Wrist                0.17        0.23   branching +-----------------+-------------+----------+---------+ +-----------------+-------------+----------+--------+ Left Cephalic    Diameter (cm)Depth (cm)Findings +-----------------+-------------+----------+--------+ Shoulder             0.40        1.40            +-----------------+-------------+----------+--------+ Prox upper arm       0.36        0.65            +-----------------+-------------+----------+--------+ Mid upper arm        0.38        0.90            +-----------------+-------------+----------+--------+ Dist upper arm       0.37        0.60            +-----------------+-------------+----------+--------+ Antecubital fossa    0.43        0.36            +-----------------+-------------+----------+--------+ Prox forearm         0.46        0.42             +-----------------+-------------+----------+--------+ Mid forearm          0.30        0.62            +-----------------+-------------+----------+--------+ Wrist                0.13        0.44            +-----------------+-------------+----------+--------+ +-----------------+-------------+----------+-----------------------------------+ Left Basilic     Diameter (cm)Depth (cm)             Findings               +-----------------+-------------+----------+-----------------------------------+ Mid upper arm        0.46        2.60                                       +-----------------+-------------+----------+-----------------------------------+ Dist upper arm       0.34        1.11                                       +-----------------+-------------+----------+-----------------------------------+ Antecubital fossa    0.31        0.63                                       +-----------------+-------------+----------+-----------------------------------+ Prox forearm                                      not visualized            +-----------------+-------------+----------+-----------------------------------+  Mid forearm                                branching, not visualized and                                               multiple branching unable to                                                          identify               +-----------------+-------------+----------+-----------------------------------+ Wrist                                       not visualized and Too smal     +-----------------+-------------+----------+-----------------------------------+ *See table(s) above for measurements and observations.  Diagnosing physician: Harold Barban MD Electronically signed by Harold Barban MD on 08/31/2018 at 8:44:32 PM.    Final    . darbepoetin (ARANESP) injection - NON-DIALYSIS  40 mcg Subcutaneous Q Wed-1800  . febuxostat  40 mg Oral Daily  .  heparin  5,000 Units Subcutaneous Q8H  . hydrALAZINE  100 mg Oral Q8H  . insulin aspart  0-9 Units Subcutaneous TID WC  . labetalol  400 mg Oral TID  . LORazepam  1 mg Oral QHS  . mupirocin ointment  1 application Nasal BID  . predniSONE  40 mg Oral Q breakfast  . sertraline  150 mg Oral Daily  . sodium chloride flush  3 mL Intravenous Q12H    BMET    Component Value Date/Time   NA 142 09/02/2018 0726   K 3.3 (L) 09/02/2018 0726   CL 103 09/02/2018 0726   CO2 23 09/02/2018 0726   GLUCOSE 99 09/02/2018 0726   BUN 74 (H) 09/02/2018 0726   CREATININE 7.65 (H) 09/02/2018 0726   CREATININE 1.57 (H) 08/19/2012 1740   CALCIUM 8.5 (L) 09/02/2018 0726   GFRNONAA 5 (L) 09/02/2018 0726   GFRAA 6 (L) 09/02/2018 0726   CBC    Component Value Date/Time   WBC 6.5 08/31/2018 0229   RBC 3.56 (L) 08/31/2018 0229   HGB 8.7 (L) 08/31/2018 0229   HCT 28.2 (L) 08/31/2018 0229   PLT 289 08/31/2018 0229   MCV 79.2 (L) 08/31/2018 0229   MCV 84.9 07/02/2012 1617   MCH 24.4 (L) 08/31/2018 0229   MCHC 30.9 08/31/2018 0229   RDW 15.4 08/31/2018 0229   LYMPHSABS 2.3 05/05/2012 0915   MONOABS 0.6 05/05/2012 0915   EOSABS 0.0 05/05/2012 0915   BASOSABS 0.0 05/05/2012 0915    Assessment/Plan: 1. progressive CKD to stage 5- pt has been lost to follow up twice at our office and each time she presented again with advanced disease. Her workup in November revealed negative SPEP but she did not have further studies.  1. FeNa>1%, UA with +prot neg blood. 2. Renal USwith significant decrease in size compared to 2011 [place] suggesting progressive chronic medical renal disease. No obstruction 3. This likely represents progressive CKD and may be  now at ESRD. Will continue to educate and prepare for HD with vein mapping and VVS consult for access placement. 4. Will hold off on [facility] for now as she is reluctant to start HD at this time and is without uremic symptoms. 5. BUN/Cr have stabilized and is without  uremic symptoms.  Will need close follow up with Dr. Joelyn Oms in our office after discharge and closely monitor for uremic symptoms and need for [facility] placement if this occurs before her avf is matured (3 months) 2. HTN- poorly controlled. Agree with resuming outpatient medications and will increase hydralazine to three times a day. 1. Improved with increasing hydralazine to 100mg  tid and labetalol 400mg  tid.  3. Vascular access- s/p L Brachiocephalic AVF creation 2/41/99 by Dr. Donnetta Hutching, hold off on [facility] for now 4. Anemia of CKD - and also with low iron stores. Will replete IV iron and will likely require ESA once her BP is under better control 1. nulicit 250mg  IV qd x 4 2. aranesp 40 mcg sq 5. Metabolic acidosis- due to #1. Will start bicarb and follow 6. Pulmonary- mild opacity of the left lung base- await PA and lateral films. covid-19 test negative 7. Secondary HPTH- elevated phos will check iPTH and start binders 8. Pre-diabetes- elevated hgb a1c at 5.7% 9. Chronic systolic CHF- euvolemic on exam 10. HLD- not on meds 11. Gout- currently on prednisone. 12. Disposition- possible discharge today and f/u next week in our office  Donetta Potts, MD [facility] [phone]

## 2018-09-02 NOTE — Progress Notes (Signed)
  Progress Note    09/02/2018 8:44 AM 1 Day Post-Op  Subjective:  Denies signs or symptoms of steal syndrome LUE   Vitals:   09/02/18 0518 09/02/18 0529  BP: (!) 167/82 (!) 152/83  Pulse: 71 79  Resp:    Temp: 98 F (36.7 C) 99.1 F (37.3 C)  SpO2: 96% 100%   Physical Exam: Lungs:  Non labored on RA Incisions:  L antecubitum incision c/d/i Extremities:  Palpable thrill, palpable L radial pulse Neurologic: motor and sensation intact L arm  CBC    Component Value Date/Time   WBC 6.5 08/31/2018 0229   RBC 3.56 (L) 08/31/2018 0229   HGB 8.7 (L) 08/31/2018 0229   HCT 28.2 (L) 08/31/2018 0229   PLT 289 08/31/2018 0229   MCV 79.2 (L) 08/31/2018 0229   MCV 84.9 07/02/2012 1617   MCH 24.4 (L) 08/31/2018 0229   MCHC 30.9 08/31/2018 0229   RDW 15.4 08/31/2018 0229   LYMPHSABS 2.3 05/05/2012 0915   MONOABS 0.6 05/05/2012 0915   EOSABS 0.0 05/05/2012 0915   BASOSABS 0.0 05/05/2012 0915    BMET    Component Value Date/Time   NA 142 09/02/2018 0726   K 3.3 (L) 09/02/2018 0726   CL 103 09/02/2018 0726   CO2 23 09/02/2018 0726   GLUCOSE 99 09/02/2018 0726   BUN 74 (H) 09/02/2018 0726   CREATININE 7.65 (H) 09/02/2018 0726   CREATININE 1.57 (H) 08/19/2012 1740   CALCIUM 8.5 (L) 09/02/2018 0726   GFRNONAA 5 (L) 09/02/2018 0726   GFRAA 6 (L) 09/02/2018 0726    INR    Component Value Date/Time   INR 1.28 04/20/2009 1016     Intake/Output Summary (Last 24 hours) at 09/02/2018 0844 Last data filed at 09/02/2018 0800 Gross per 24 hour  Intake 2576.72 ml  Output 1360 ml  Net 1216.72 ml     Assessment/Plan:  63 y.o. female is s/p L brachiocephalic fistula 1 Day Post-Op   Patent fistula with palpable thrill, without symptoms of steal Check fistula duplex in 4-6 weeks Ok for discharge from vascular standpoint   Dagoberto Ligas, PA-C [facility] [phone] 09/02/2018 8:44 AM

## 2018-09-03 LAB — RENAL FUNCTION PANEL
Albumin: 2.8 g/dL — ABNORMAL LOW (ref 3.5–5.0)
Anion gap: 14 (ref 5–15)
BUN: 78 mg/dL — ABNORMAL HIGH (ref 8–23)
CO2: 25 mmol/L (ref 22–32)
Calcium: 8.6 mg/dL — ABNORMAL LOW (ref 8.9–10.3)
Chloride: 102 mmol/L (ref 98–111)
Creatinine, Ser: 7.79 mg/dL — ABNORMAL HIGH (ref 0.44–1.00)
GFR calc Af Amer: 6 mL/min — ABNORMAL LOW (ref >60)
GFR calc non Af Amer: 5 mL/min — ABNORMAL LOW (ref >60)
Glucose, Bld: 120 mg/dL — ABNORMAL HIGH (ref 70–99)
Phosphorus: 6.4 mg/dL — ABNORMAL HIGH (ref 2.5–4.6)
Potassium: 3.9 mmol/L (ref 3.5–5.1)
Sodium: 141 mmol/L (ref 135–145)

## 2018-09-03 LAB — GLUCOSE, CAPILLARY
Glucose-Capillary: 155 mg/dL — ABNORMAL HIGH (ref 70–99)
Glucose-Capillary: 164 mg/dL — ABNORMAL HIGH (ref 70–99)
Glucose-Capillary: 164 mg/dL — ABNORMAL HIGH (ref 70–99)

## 2018-09-03 MED ORDER — HYDRALAZINE HCL 20 MG/ML IJ SOLN
10.0000 mg | Freq: Once | INTRAMUSCULAR | Status: AC
  Administered 2018-09-03: 10 mg via INTRAVENOUS
  Filled 2018-09-03: qty 1

## 2018-09-03 MED ORDER — SEVELAMER CARBONATE 800 MG PO TABS
800.0000 mg | ORAL_TABLET | Freq: Three times a day (TID) | ORAL | Status: DC
  Administered 2018-09-03 – 2018-09-06 (×9): 800 mg via ORAL
  Filled 2018-09-03 (×9): qty 1

## 2018-09-03 MED ORDER — HYDROCODONE-ACETAMINOPHEN 5-325 MG PO TABS: 1.0000 | ORAL_TABLET | Freq: Four times a day (QID) | ORAL | Status: DC | PRN

## 2018-09-03 MED ORDER — NIFEDIPINE ER OSMOTIC RELEASE 60 MG PO TB24
60.0000 mg | ORAL_TABLET | Freq: Every day | ORAL | Status: DC
  Administered 2018-09-03 – 2018-09-04 (×2): 60 mg via ORAL
  Filled 2018-09-03 (×3): qty 1

## 2018-09-03 NOTE — Progress Notes (Signed)
PROGRESS NOTE    Chelsea Roberts  QQI:[id number] DOB: [birth year] DOA: 08/30/2018 PCP: Lucianne Lei, MD   Brief Narrative: 63 year old female with a past medical history significant for poorly controlled hypertension, chronic systolic congestive heart failure, obstructive sleep apnea not on CPAP, gout, chronic kidney disease with a baseline creatinine of 3.51-year back presented to the emergency department with complaints of multiple flareups of gout in multiple joints over the past several weeks.  Patient was experiencing poorly, dizzy associated with the chest pain, having difficulty getting out of the chair secondary to generalized weakness came to the emergency department.  Work-up in the emergency department was found to have systolic blood pressure in 200s and of 7.5, gouty arthritis in multiple joints.  Nephrology is consulted, vascular surgery is consulted for AV graft placement.  ##Acute on chronic renal failure -Secondary to poorly controlled blood pressure with hypertensive nephropathy -Nephrology is following -Ultrasound of the kidneys showed a significant decrease in the size compared to 2011 -Underwent AV graft placement by vascular surgery 09/01/2018 to the left arm  ##Hypertension accelerated -Continue with hydralazine 100 mg every 8 hours, increased labetalol 400 mg 3 times a day -Keep the patient on as needed hydralazine, labetalol -Started the patient on Procardia XL 60 mg, only side effect patient had was lower extremity swelling.  Not a true allergy  ##Anemia of chronic kidney disease -Give IV iron, Epogen -Hemoglobin stable  ##Gouty arthritis -discontinue the prednisone -Good improvement  ##Prediabetes -Hemoglobin A1c is 5.7  ##Chronic systolic congestive heart failure -Keep the patient on IV Lasix  ##Hyperlipidemia -Patient has elevated cholesterol, triglycerides, LDL -Continue with the statin  ##Anxiety/depression -Start the patient on Zoloft -Discussed  with the patient to avoid benzodiazepines  Assessment & Plan:   Principal Problem:   Acute renal failure superimposed on chronic kidney disease ([place]) Active Problems:   HTN (hypertension)   Hyperlipidemia   Systolic CHF, chronic (HCC)   Obstructive sleep apnea   Hypothyroidism   Gout   Prediabetes    DVT prophylaxis: Heparin Code Status: Full code  family Communication: Patient, husband  disposition Plan: Home   Consultants:   Nephrology  Vascular surgery  Procedures:   Subjective: Complaining of pain in the left antecubital area where his AV fistula is placed  Objective: Vitals:   09/03/18 0900 09/03/18 1055 09/03/18 1200 09/03/18 1401  BP: (!) 197/99 (!) 185/86 (!) 184/94 (!) 177/89  Pulse: 75 79  79  Resp: 16   18  Temp:    97.9 F (36.6 C)  TempSrc:      SpO2: 100%   96%  Weight:      Height:        Intake/Output Summary (Last 24 hours) at 09/03/2018 1438 Last data filed at 09/03/2018 1200 Gross per 24 hour  Intake 1200 ml  Output 1000 ml  Net 200 ml   Filed Weights   08/30/18 0856 08/30/18 1551 09/01/18 1135  Weight: 89.8 kg 88.9 kg 88.9 kg    Examination:  General exam: Appears calm and comfortable  Respiratory system: Clear to auscultation. Respiratory effort normal. Cardiovascular system: S1 & S2 heard, RRR. No JVD, murmurs, rubs, gallops or clicks. No pedal edema. Gastrointestinal system: Abdomen is nondistended, soft and nontender. No organomegaly or masses felt. Normal bowel sounds heard. Central nervous system: Alert and oriented. No focal neurological deficits. Extremities: Symmetric 5 x 5 power.  Mild swelling in the antecubital area of the left arm Skin: No rashes, lesions or ulcers Psychiatry:  Judgement and insight appear normal. Mood & affect appropriate.     Data Reviewed: I have personally reviewed following labs and imaging studies  CBC: Recent Labs  Lab 08/30/18 0949 08/31/18 0229  WBC 7.7 6.5  HGB 7.7* 8.7*  HCT  25.7* 28.2*  MCV 81.1 79.2*  PLT 213 854   Basic Metabolic Panel: Recent Labs  Lab 08/30/18 0949 08/30/18 1249 08/31/18 0229 09/01/18 0303 09/02/18 0726 09/03/18 0356  NA 140  --  139 140 142 141  K 3.8  --  3.5 3.4* 3.3* 3.9  CL 109  --  107 106 103 102  CO2 16*  --  14* 20* 23 25  GLUCOSE 129*  --  155* 163* 99 120*  BUN 71*  --  74* 81* 74* 78*  CREATININE 7.49*  --  7.40* 7.65* 7.65* 7.79*  CALCIUM 8.8*  --  9.0 8.8* 8.5* 8.6*  MG  --  2.3  --   --   --   --   PHOS  --  6.8* 5.4* 5.2* 7.2* 6.4*   GFR: Estimated Creatinine Clearance: 8.5 mL/min (A) (by C-G formula based on SCr of 7.79 mg/dL (H)). Liver Function Tests: Recent Labs  Lab 08/31/18 0229 09/01/18 0303 09/02/18 0726 09/03/18 0356  ALBUMIN 2.8* 2.6* 2.6* 2.8*   No results for input(s): LIPASE, AMYLASE in the last 168 hours. No results for input(s): AMMONIA in the last 168 hours. Coagulation Profile: No results for input(s): INR, PROTIME in the last 168 hours. Cardiac Enzymes: No results for input(s): CKTOTAL, CKMB, CKMBINDEX, TROPONINI in the last 168 hours. BNP (last 3 results) No results for input(s): PROBNP in the last 8760 hours. HbA1C: No results for input(s): HGBA1C in the last 72 hours. CBG: Recent Labs  Lab 09/02/18 0746 09/02/18 1240 09/02/18 1728 09/02/18 2134 09/03/18 1219  GLUCAP 107* 226* 145* 189* 155*   Lipid Profile: No results for input(s): CHOL, HDL, LDLCALC, TRIG, CHOLHDL, LDLDIRECT in the last 72 hours. Thyroid Function Tests: No results for input(s): TSH, T4TOTAL, FREET4, T3FREE, THYROIDAB in the last 72 hours. Anemia Panel: No results for input(s): VITAMINB12, FOLATE, FERRITIN, TIBC, IRON, RETICCTPCT in the last 72 hours. Sepsis Labs: No results for input(s): PROCALCITON, LATICACIDVEN in the last 168 hours.  Recent Results (from the past 240 hour(s))  SARS CORONAVIRUS 2 Nasal Swab Aptima Multi Swab     Status: None   Collection Time: 08/30/18  1:03 PM   Specimen:  Aptima Multi Swab; Nasal Swab  Result Value Ref Range Status   SARS Coronavirus 2 NEGATIVE NEGATIVE Final    Comment: (NOTE) SARS-CoV-2 target nucleic acids are NOT DETECTED. The SARS-CoV-2 RNA is generally detectable in upper and lower respiratory specimens during the acute phase of infection. Negative results do not preclude SARS-CoV-2 infection, do not rule out co-infections with other pathogens, and should not be used as the sole basis for treatment or other patient management decisions. Negative results must be combined with clinical observations, patient history, and epidemiological information. The expected result is Negative. Fact Sheet for Patients: SugarRoll.be Fact Sheet for Healthcare Providers: https://www.woods-mathews.com/ This test is not yet approved or cleared by the [place] FDA and  has been authorized for detection and/or diagnosis of SARS-CoV-2 by FDA under an Emergency Use Authorization (EUA). This EUA will remain  in effect (meaning this test can be used) for the duration of the COVID-19 declaration under Section 56 4(b)(1) of the Act, 21 U.S.C. section 360bbb-3(b)(1), unless the authorization is terminated  or revoked sooner. Performed at [facility], [place] [address], [place], [place] [address]   Surgical PCR screen     Status: Abnormal   Collection Time: 09/01/18  4:31 AM   Specimen: Nasal Mucosa; Nasal Swab  Result Value Ref Range Status   MRSA, PCR NEGATIVE NEGATIVE Final   Staphylococcus aureus POSITIVE (A) NEGATIVE Final    Comment: (NOTE) The Xpert SA Assay (FDA approved for NASAL specimens in patients 78 years of age and older), is one component of a comprehensive surveillance program. It is not intended to diagnose infection nor to guide or monitor treatment. Performed at [facility], [place] [address] [place], [place] [address]          Radiology Studies: No results found.       Scheduled Meds: . darbepoetin (ARANESP) injection - NON-DIALYSIS  40 mcg Subcutaneous Q Wed-1800  . febuxostat  40 mg Oral Daily  . heparin  5,000 Units Subcutaneous Q8H  . hydrALAZINE  100 mg Oral Q8H  . insulin aspart  0-9 Units Subcutaneous TID WC  . labetalol  400 mg Oral TID  . LORazepam  1 mg Oral QHS  . mupirocin ointment  1 application Nasal BID  . NIFEdipine  60 mg Oral Daily  . predniSONE  40 mg Oral Q breakfast  . sertraline  150 mg Oral Daily  . sevelamer carbonate  800 mg Oral TID WC  . sodium chloride flush  3 mL Intravenous Q12H   Continuous Infusions: . ferric gluconate (FERRLECIT/NULECIT) IV 250 mg (09/03/18 0900)     LOS: 4 days    Time spent: 35 minutes    Archimedes Harold, MD [facility] Pager [phone]  If 7PM-7AM, please contact night-coverage [email or web address] Password [facility] 09/03/2018, 2:38 PM

## 2018-09-03 NOTE — Progress Notes (Signed)
[facility]    NEPHROLOGY PROGRESS NOTE  SUBJECTIVE: Patient with no acute complaints.  Higher blood pressure noted this morning.  Denies chest pain, shortness of breath, nausea, vomiting, diarrhea or dysuria.  All other review of systems are negative.  OBJECTIVE:  Vitals:   09/03/18 1055 09/03/18 1200  BP: (!) 185/86 (!) 184/94  Pulse: 79   Resp:    Temp:    SpO2:      Intake/Output Summary (Last 24 hours) at 09/03/2018 1321 Last data filed at 09/03/2018 1200 Gross per 24 hour  Intake 1200 ml  Output 1000 ml  Net 200 ml      General:  AAOx3 NAD HEENT: MMM [place] AT anicteric sclera Neck:  No JVD, no adenopathy CV:  Heart RRR  Lungs:  L/S CTA bilaterally Abd:  abd SNT/ND with normal BS GU:  Bladder non-palpable Extremities:  No LE edema.  Left upper extremity AV fistula. Skin:  No skin rash  MEDICATIONS:  . darbepoetin (ARANESP) injection - NON-DIALYSIS  40 mcg Subcutaneous Q Wed-1800  . febuxostat  40 mg Oral Daily  . heparin  5,000 Units Subcutaneous Q8H  . hydrALAZINE  100 mg Oral Q8H  . insulin aspart  0-9 Units Subcutaneous TID WC  . labetalol  400 mg Oral TID  . LORazepam  1 mg Oral QHS  . mupirocin ointment  1 application Nasal BID  . NIFEdipine  60 mg Oral Daily  . predniSONE  40 mg Oral Q breakfast  . sertraline  150 mg Oral Daily  . sodium chloride flush  3 mL Intravenous Q12H       LABS:   CBC Latest Ref Rng & Units 08/31/2018 08/30/2018 07/02/2012  WBC 4.0 - 10.5 K/uL 6.5 7.7 10.3(A)  Hemoglobin 12.0 - 15.0 g/dL 8.7(L) 7.7(L) 14.7  Hematocrit 36.0 - 46.0 % 28.2(L) 25.7(L) 46.6  Platelets 150 - 400 K/uL 289 213 -    CMP Latest Ref Rng & Units 09/03/2018 09/02/2018 09/01/2018  Glucose 70 - 99 mg/dL 120(H) 99 163(H)  BUN 8 - 23 mg/dL 78(H) 74(H) 81(H)  Creatinine 0.44 - 1.00 mg/dL 7.79(H) 7.65(H) 7.65(H)  Sodium 135 - 145 mmol/L 141 142 140  Potassium 3.5 - 5.1 mmol/L 3.9 3.3(L) 3.4(L)  Chloride 98 - 111 mmol/L 102 103 106  CO2 22 - 32  mmol/L 25 23 20(L)  Calcium 8.9 - 10.3 mg/dL 8.6(L) 8.5(L) 8.8(L)  Total Protein 6.0 - 8.3 g/dL - - -  Total Bilirubin 0.3 - 1.2 mg/dL - - -  Alkaline Phos 39 - 117 U/L - - -  AST 0 - 37 U/L - - -  ALT 0 - 35 U/L - - -    Lab Results  Component Value Date   CALCIUM 8.6 (L) 09/03/2018   CAION 1.18 02/20/2009   PHOS 6.4 (H) 09/03/2018       Component Value Date/Time   COLORURINE YELLOW 08/30/2018 2157   APPEARANCEUR HAZY (A) 08/30/2018 2157   LABSPEC 1.009 08/30/2018 2157   PHURINE 5.0 08/30/2018 2157   GLUCOSEU 50 (A) 08/30/2018 2157   HGBUR NEGATIVE 08/30/2018 2157   BILIRUBINUR NEGATIVE 08/30/2018 2157   BILIRUBINUR neg 07/02/2012 1616   KETONESUR NEGATIVE 08/30/2018 2157   PROTEINUR 100 (A) 08/30/2018 2157   UROBILINOGEN 0.2 07/02/2012 1616   UROBILINOGEN 1.0 05/12/2009 0939   NITRITE NEGATIVE 08/30/2018 2157   LEUKOCYTESUR SMALL (A) 08/30/2018 2157      Component Value Date/Time   PHART 7.391 04/21/2009 1315  PCO2ART 32.9 (L) 04/21/2009 1315   PO2ART 83.4 04/21/2009 1315   HCO3 18.8 (L) 04/21/2009 1315   TCO2 19.7 04/21/2009 1315   ACIDBASEDEF 4.7 (H) 04/21/2009 1315   O2SAT 95.4 04/21/2009 1315       Component Value Date/Time   IRON 17 (L) 08/30/2018 1430   TIBC 228 (L) 08/30/2018 1430   FERRITIN 226 08/30/2018 1430   IRONPCTSAT 7 (L) 08/30/2018 1430   IRONPCTSAT 12 (L) 01/25/2012 1431       ASSESSMENT/PLAN:    1. progressive CKD to stage 5- pt has been lost to follow up twice at our office and each time she presented again with advanced disease. Her workup in November revealed negative SPEP but she did not have further studies.  1. FeNa>1%, UA with +prot neg blood. 2. Renal USwith significant decrease in size compared to 2011 [place] suggesting progressive chronic medical renal disease. No obstruction 3. This likely represents progressive CKD and may be now at ESRD. Will continue to educate and prepare for HD with vein mapping and VVS consult for access  placement. 4. Will hold off on [facility] for now as she is reluctant to start HD at this time and is without uremic symptoms. 5. BUN/Cr have stabilized and is without uremic symptoms.  Will need close follow up with Dr. Joelyn Oms in our office after discharge and closely monitor for uremic symptoms and need for [facility] placement if this occurs before her avf is matured (3 months) 2. HTN- poorly controlled. Agree with resuming outpatient medications and will increase hydralazine to three times a day. 1. Added procardia 60mg  daily this AM. 3. Vascular access- s/p L Brachiocephalic AVF creation 07/17/29 by Dr. Donnetta Hutching, hold off on [facility] for now 4. Anemia of CKD - and also with low iron stores. Replete IV iron and will likely require ESA once her BP is under better control 1. nulicit 250mg  IV qd x 4 2. aranesp 40 mcg sq 5. Metabolic acidosis- due to #1. Will cont bicarb and follow 6. Pulmonary- mild opacity of the left lung base- await PA and lateral films. covid-19 test negative 7. Secondary HPTH- elevated phos will  - add binders, PTH pending 8. Pre-diabetes- elevated hgb a1c at 5.7% 9. Chronic systolic CHF- euvolemic on exam 10. HLD- not on meds 11. Gout- currently on prednisone. 12. Disposition- f/u next week in our office     [place], DO, [place]

## 2018-09-03 NOTE — Progress Notes (Signed)
amion paged [place], bp 200/88 30 min post 10mg  iv hydralazine, pt takes ativan at home every morning, pt thinks it would help.

## 2018-09-04 DIAGNOSIS — E034 Atrophy of thyroid (acquired): Secondary | ICD-10-CM

## 2018-09-04 LAB — RENAL FUNCTION PANEL
Albumin: 2.9 g/dL — ABNORMAL LOW (ref 3.5–5.0)
Anion gap: 14 (ref 5–15)
BUN: 86 mg/dL — ABNORMAL HIGH (ref 8–23)
CO2: 22 mmol/L (ref 22–32)
Calcium: 8.9 mg/dL (ref 8.9–10.3)
Chloride: 106 mmol/L (ref 98–111)
Creatinine, Ser: 7.72 mg/dL — ABNORMAL HIGH (ref 0.44–1.00)
GFR calc Af Amer: 6 mL/min — ABNORMAL LOW (ref >60)
GFR calc non Af Amer: 5 mL/min — ABNORMAL LOW (ref >60)
Glucose, Bld: 110 mg/dL — ABNORMAL HIGH (ref 70–99)
Phosphorus: 5.3 mg/dL — ABNORMAL HIGH (ref 2.5–4.6)
Potassium: 3.9 mmol/L (ref 3.5–5.1)
Sodium: 142 mmol/L (ref 135–145)

## 2018-09-04 LAB — GLUCOSE, CAPILLARY
Glucose-Capillary: 86 mg/dL (ref 70–99)
Glucose-Capillary: 95 mg/dL (ref 70–99)
Glucose-Capillary: 117 mg/dL — ABNORMAL HIGH (ref 70–99)

## 2018-09-04 NOTE — Progress Notes (Signed)
PROGRESS NOTE    Chelsea Roberts  LAG:[id number] DOB: January 28, 1955 DOA: 08/30/2018 PCP: Lucianne Lei, MD   Brief Narrative: 63 year old female with a past medical history significant for poorly controlled hypertension, chronic systolic congestive heart failure, obstructive sleep apnea not on CPAP, gout, chronic kidney disease with a baseline creatinine of 3.51-year back presented to the emergency department with complaints of multiple flareups of gout in multiple joints over the past several weeks.  Patient was experiencing poorly, dizzy associated with the chest pain, having difficulty getting out of the chair secondary to generalized weakness came to the emergency department.  Work-up in the emergency department was found to have systolic blood pressure in 200s and of 7.5, gouty arthritis in multiple joints.  Nephrology is consulted, vascular surgery is consulted for AV graft placement.  ##Acute on chronic renal failure -Secondary to poorly controlled blood pressure with hypertensive nephropathy -Nephrology is following -Ultrasound of the kidneys showed a significant decrease in the size compared to 2011 -Underwent AV graft placement by vascular surgery 09/01/2018 to the left arm  ##Hypertension accelerated -Continue with hydralazine 100 mg every 8 hours, increased labetalol 400 mg 3 times a day -Keep the patient on as needed hydralazine, labetalol -Started the patient on Procardia XL 60 mg, only side effect patient had was lower extremity swelling.  Not a true allergy -Still moderately controlled, wait for another day and increase Procardia 2 times a day and follow-up  ##Anemia of chronic kidney disease -Give IV iron, Epogen -Hemoglobin stable  ##Gouty arthritis -discontinue the prednisone -Good improvement  ##Prediabetes -Hemoglobin A1c is 5.7  ##Chronic systolic congestive heart failure -Keep the patient on IV Lasix  ##Hyperlipidemia -Patient has elevated cholesterol,  triglycerides, LDL -Continue with the statin  ##Anxiety/depression -Start the patient on Zoloft -Discussed with the patient to avoid benzodiazepines  Assessment & Plan:   Principal Problem:   Acute renal failure superimposed on chronic kidney disease ([place]) Active Problems:   HTN (hypertension)   Hyperlipidemia   Systolic CHF, chronic (HCC)   Obstructive sleep apnea   Hypothyroidism   Gout   Prediabetes    DVT prophylaxis: Heparin Code Status: Full code  family Communication: Patient, husband  disposition Plan: Home   Consultants:   Nephrology  Vascular surgery  Procedures:   Subjective: Complaining of pain in the left antecubital area where his AV fistula is placed  Objective: Vitals:   09/03/18 1723 09/03/18 2211 09/04/18 0525 09/04/18 0640  BP: (!) 161/72 (!) 152/75 (!) 186/85 (!) 171/86  Pulse: 79 76 72 74  Resp: 16     Temp:  98.3 F (36.8 C) 98.1 F (36.7 C)   TempSrc:      SpO2: 97% 94% 97%   Weight:      Height:        Intake/Output Summary (Last 24 hours) at 09/04/2018 1413 Last data filed at 09/04/2018 1100 Gross per 24 hour  Intake 240 ml  Output -  Net 240 ml   Filed Weights   08/30/18 0856 08/30/18 1551 09/01/18 1135  Weight: 89.8 kg 88.9 kg 88.9 kg    Examination:  General exam: Appears calm and comfortable  Respiratory system: Clear to auscultation. Respiratory effort normal. Cardiovascular system: S1 & S2 heard, RRR. No JVD, murmurs, rubs, gallops or clicks. No pedal edema. Gastrointestinal system: Abdomen is nondistended, soft and nontender. No organomegaly or masses felt. Normal bowel sounds heard. Central nervous system: Alert and oriented. No focal neurological deficits. Extremities: Symmetric 5 x 5 power.  Mild swelling in the antecubital area of the left arm Skin: No rashes, lesions or ulcers Psychiatry: Judgement and insight appear normal. Mood & affect appropriate.     Data Reviewed: I have personally reviewed  following labs and imaging studies  CBC: Recent Labs  Lab 08/30/18 0949 08/31/18 0229  WBC 7.7 6.5  HGB 7.7* 8.7*  HCT 25.7* 28.2*  MCV 81.1 79.2*  PLT 213 956   Basic Metabolic Panel: Recent Labs  Lab 08/30/18 1249 08/31/18 0229 09/01/18 0303 09/02/18 0726 09/03/18 0356 09/04/18 0317  NA  --  139 140 142 141 142  K  --  3.5 3.4* 3.3* 3.9 3.9  CL  --  107 106 103 102 106  CO2  --  14* 20* 23 25 22   GLUCOSE  --  155* 163* 99 120* 110*  BUN  --  74* 81* 74* 78* 86*  CREATININE  --  7.40* 7.65* 7.65* 7.79* 7.72*  CALCIUM  --  9.0 8.8* 8.5* 8.6* 8.9  MG 2.3  --   --   --   --   --   PHOS 6.8* 5.4* 5.2* 7.2* 6.4* 5.3*   GFR: Estimated Creatinine Clearance: 8.6 mL/min (A) (by C-G formula based on SCr of 7.72 mg/dL (H)). Liver Function Tests: Recent Labs  Lab 08/31/18 0229 09/01/18 0303 09/02/18 0726 09/03/18 0356 09/04/18 0317  ALBUMIN 2.8* 2.6* 2.6* 2.8* 2.9*   No results for input(s): LIPASE, AMYLASE in the last 168 hours. No results for input(s): AMMONIA in the last 168 hours. Coagulation Profile: No results for input(s): INR, PROTIME in the last 168 hours. Cardiac Enzymes: No results for input(s): CKTOTAL, CKMB, CKMBINDEX, TROPONINI in the last 168 hours. BNP (last 3 results) No results for input(s): PROBNP in the last 8760 hours. HbA1C: No results for input(s): HGBA1C in the last 72 hours. CBG: Recent Labs  Lab 09/03/18 1219 09/03/18 1700 09/03/18 2209 09/04/18 0751 09/04/18 1147  GLUCAP 155* 164* 164* 86 95   Lipid Profile: No results for input(s): CHOL, HDL, LDLCALC, TRIG, CHOLHDL, LDLDIRECT in the last 72 hours. Thyroid Function Tests: No results for input(s): TSH, T4TOTAL, FREET4, T3FREE, THYROIDAB in the last 72 hours. Anemia Panel: No results for input(s): VITAMINB12, FOLATE, FERRITIN, TIBC, IRON, RETICCTPCT in the last 72 hours. Sepsis Labs: No results for input(s): PROCALCITON, LATICACIDVEN in the last 168 hours.  Recent Results (from  the past 240 hour(s))  SARS CORONAVIRUS 2 Nasal Swab Aptima Multi Swab     Status: None   Collection Time: 08/30/18  1:03 PM   Specimen: Aptima Multi Swab; Nasal Swab  Result Value Ref Range Status   SARS Coronavirus 2 NEGATIVE NEGATIVE Final    Comment: (NOTE) SARS-CoV-2 target nucleic acids are NOT DETECTED. The SARS-CoV-2 RNA is generally detectable in upper and lower respiratory specimens during the acute phase of infection. Negative results do not preclude SARS-CoV-2 infection, do not rule out co-infections with other pathogens, and should not be used as the sole basis for treatment or other patient management decisions. Negative results must be combined with clinical observations, patient history, and epidemiological information. The expected result is Negative. Fact Sheet for Patients: SugarRoll.be Fact Sheet for Healthcare Providers: https://www.woods-mathews.com/ This test is not yet approved or cleared by the [place] FDA and  has been authorized for detection and/or diagnosis of SARS-CoV-2 by FDA under an Emergency Use Authorization (EUA). This EUA will remain  in effect (meaning this test can be used) for the duration of the  COVID-19 declaration under Section 56 4(b)(1) of the Act, 21 U.S.C. section 360bbb-3(b)(1), unless the authorization is terminated or revoked sooner. Performed at [facility], [place] [address] [place], [place] [address]   Surgical PCR screen     Status: Abnormal   Collection Time: 09/01/18  4:31 AM   Specimen: Nasal Mucosa; Nasal Swab  Result Value Ref Range Status   MRSA, PCR NEGATIVE NEGATIVE Final   Staphylococcus aureus POSITIVE (A) NEGATIVE Final    Comment: (NOTE) The Xpert SA Assay (FDA approved for NASAL specimens in patients 67 years of age and older), is one component of a comprehensive surveillance program. It is not intended to diagnose infection nor to guide or monitor treatment.  Performed at [facility], [place] [address] [place], [place] [address]          Radiology Studies: No results found.      Scheduled Meds: . darbepoetin (ARANESP) injection - NON-DIALYSIS  40 mcg Subcutaneous Q Wed-1800  . febuxostat  40 mg Oral Daily  . heparin  5,000 Units Subcutaneous Q8H  . hydrALAZINE  100 mg Oral Q8H  . insulin aspart  0-9 Units Subcutaneous TID WC  . labetalol  400 mg Oral TID  . LORazepam  1 mg Oral QHS  . mupirocin ointment  1 application Nasal BID  . NIFEdipine  60 mg Oral Daily  . sertraline  150 mg Oral Daily  . sevelamer carbonate  800 mg Oral TID WC  . sodium chloride flush  3 mL Intravenous Q12H   Continuous Infusions:    LOS: 5 days    Time spent: 35 minutes    Lauro Manlove, MD [facility] Pager [phone]  If 7PM-7AM, please contact night-coverage [email or web address] Password [facility] 09/04/2018, 2:13 PM

## 2018-09-04 NOTE — Progress Notes (Signed)
[facility]    NEPHROLOGY PROGRESS NOTE  SUBJECTIVE: Patient with no acute complaints.  Some improvements in blood pressure noted.  Denies chest pain, shortness of breath, nausea, vomiting, diarrhea or dysuria.  All other review of systems are negative.  OBJECTIVE:  Vitals:   09/04/18 0525 09/04/18 0640  BP: (!) 186/85 (!) 171/86  Pulse: 72 74  Resp:    Temp: 98.1 F (36.7 C)   SpO2: 97%     Intake/Output Summary (Last 24 hours) at 09/04/2018 1410 Last data filed at 09/04/2018 1100 Gross per 24 hour  Intake 240 ml  Output -  Net 240 ml      General:  AAOx3 NAD HEENT: MMM [place] AT anicteric sclera Neck:  No JVD, no adenopathy CV:  Heart RRR  Lungs:  L/S CTA bilaterally Abd:  abd SNT/ND with normal BS GU:  Bladder non-palpable Extremities:  trace LE edema.  Left upper extremity AV fistula. Skin:  No skin rash  MEDICATIONS:  . darbepoetin (ARANESP) injection - NON-DIALYSIS  40 mcg Subcutaneous Q Wed-1800  . febuxostat  40 mg Oral Daily  . heparin  5,000 Units Subcutaneous Q8H  . hydrALAZINE  100 mg Oral Q8H  . insulin aspart  0-9 Units Subcutaneous TID WC  . labetalol  400 mg Oral TID  . LORazepam  1 mg Oral QHS  . mupirocin ointment  1 application Nasal BID  . NIFEdipine  60 mg Oral Daily  . sertraline  150 mg Oral Daily  . sevelamer carbonate  800 mg Oral TID WC  . sodium chloride flush  3 mL Intravenous Q12H       LABS:   CBC Latest Ref Rng & Units 08/31/2018 08/30/2018 07/02/2012  WBC 4.0 - 10.5 K/uL 6.5 7.7 10.3(A)  Hemoglobin 12.0 - 15.0 g/dL 8.7(L) 7.7(L) 14.7  Hematocrit 36.0 - 46.0 % 28.2(L) 25.7(L) 46.6  Platelets 150 - 400 K/uL 289 213 -    CMP Latest Ref Rng & Units 09/04/2018 09/03/2018 09/02/2018  Glucose 70 - 99 mg/dL 110(H) 120(H) 99  BUN 8 - 23 mg/dL 86(H) 78(H) 74(H)  Creatinine 0.44 - 1.00 mg/dL 7.72(H) 7.79(H) 7.65(H)  Sodium 135 - 145 mmol/L 142 141 142  Potassium 3.5 - 5.1 mmol/L 3.9 3.9 3.3(L)  Chloride 98 - 111 mmol/L 106  102 103  CO2 22 - 32 mmol/L 22 25 23   Calcium 8.9 - 10.3 mg/dL 8.9 8.6(L) 8.5(L)  Total Protein 6.0 - 8.3 g/dL - - -  Total Bilirubin 0.3 - 1.2 mg/dL - - -  Alkaline Phos 39 - 117 U/L - - -  AST 0 - 37 U/L - - -  ALT 0 - 35 U/L - - -    Lab Results  Component Value Date   CALCIUM 8.9 09/04/2018   CAION 1.18 02/20/2009   PHOS 5.3 (H) 09/04/2018       Component Value Date/Time   COLORURINE YELLOW 08/30/2018 2157   APPEARANCEUR HAZY (A) 08/30/2018 2157   LABSPEC 1.009 08/30/2018 2157   PHURINE 5.0 08/30/2018 2157   GLUCOSEU 50 (A) 08/30/2018 2157   HGBUR NEGATIVE 08/30/2018 2157   BILIRUBINUR NEGATIVE 08/30/2018 2157   BILIRUBINUR neg 07/02/2012 1616   KETONESUR NEGATIVE 08/30/2018 2157   PROTEINUR 100 (A) 08/30/2018 2157   UROBILINOGEN 0.2 07/02/2012 1616   UROBILINOGEN 1.0 05/12/2009 0939   NITRITE NEGATIVE 08/30/2018 2157   LEUKOCYTESUR SMALL (A) 08/30/2018 2157      Component Value Date/Time   PHART 7.391 04/21/2009  1315   PCO2ART 32.9 (L) 04/21/2009 1315   PO2ART 83.4 04/21/2009 1315   HCO3 18.8 (L) 04/21/2009 1315   TCO2 19.7 04/21/2009 1315   ACIDBASEDEF 4.7 (H) 04/21/2009 1315   O2SAT 95.4 04/21/2009 1315       Component Value Date/Time   IRON 17 (L) 08/30/2018 1430   TIBC 228 (L) 08/30/2018 1430   FERRITIN 226 08/30/2018 1430   IRONPCTSAT 7 (L) 08/30/2018 1430   IRONPCTSAT 12 (L) 01/25/2012 1431       ASSESSMENT/PLAN:    1. progressive CKD to stage 5- pt has been lost to follow up twice at our office and each time she presented again with advanced disease. Her workup in November revealed negative SPEP but she did not have further studies.  1. FeNa>1%, UA with +prot neg blood. 2. Renal USwith significant decrease in size compared to 2011 [place] suggesting progressive chronic medical renal disease. No obstruction 3. This likely represents progressive CKD and may be now at ESRD. S/p LUE AVF 4. Will hold off on [facility] for now as she is reluctant to start HD  at this time and is without uremic symptoms. 5. BUN/Cr have stabilized and is without uremic symptoms.  Will need close follow up with Dr. Joelyn Oms in our office after discharge and closely monitor for uremic symptoms and need for [facility] placement if this occurs before her avf is matured (3 months) 2. HTN- poorly controlled. Agree with resuming outpatient medications and will increase hydralazine to three times a day. 1. Added procardia 60mg  daily yesterday, and prednisone DC'ed. 2. Could switch labetolol back to carvedilol if no improvement 3. Vascular access- s/p L Brachiocephalic AVF creation 7/51/02 by Dr. Donnetta Hutching, hold off on [facility] for now 4. Anemia of CKD - and also with low iron stores. Replete IV iron and will likely require ESA once her BP is under better control 1. nulicit 250mg  IV qd x 4 2. aranesp 40 mcg sq 5. Metabolic acidosis- due to #1. Will cont bicarb and follow -  improved 6. Secondary HPTH- elevated phos - on sevelamer, PTH pending 7. Pre-diabetes- elevated hgb a1c at 5.7% 8. Chronic systolic CHF- euvolemic on exam 9. HLD- not on meds 10. Gout- currently on prednisone. 11. Disposition- f/u next week in our office     [place], DO, [place]

## 2018-09-05 DIAGNOSIS — R7303 Prediabetes: Secondary | ICD-10-CM

## 2018-09-05 LAB — RENAL FUNCTION PANEL
Albumin: 2.7 g/dL — ABNORMAL LOW (ref 3.5–5.0)
Anion gap: 14 (ref 5–15)
BUN: 86 mg/dL — ABNORMAL HIGH (ref 8–23)
CO2: 21 mmol/L — ABNORMAL LOW (ref 22–32)
Calcium: 8.8 mg/dL — ABNORMAL LOW (ref 8.9–10.3)
Chloride: 105 mmol/L (ref 98–111)
Creatinine, Ser: 7.58 mg/dL — ABNORMAL HIGH (ref 0.44–1.00)
GFR calc Af Amer: 6 mL/min — ABNORMAL LOW (ref >60)
GFR calc non Af Amer: 5 mL/min — ABNORMAL LOW (ref >60)
Glucose, Bld: 92 mg/dL (ref 70–99)
Phosphorus: 5.3 mg/dL — ABNORMAL HIGH (ref 2.5–4.6)
Potassium: 3.6 mmol/L (ref 3.5–5.1)
Sodium: 140 mmol/L (ref 135–145)

## 2018-09-05 LAB — GLUCOSE, CAPILLARY
Glucose-Capillary: 95 mg/dL (ref 70–99)
Glucose-Capillary: 118 mg/dL — ABNORMAL HIGH (ref 70–99)
Glucose-Capillary: 86 mg/dL (ref 70–99)
Glucose-Capillary: 146 mg/dL — ABNORMAL HIGH (ref 70–99)

## 2018-09-05 MED ORDER — CARVEDILOL 25 MG PO TABS
25.0000 mg | ORAL_TABLET | Freq: Two times a day (BID) | ORAL | Status: DC
  Administered 2018-09-05 – 2018-09-06 (×2): 25 mg via ORAL
  Filled 2018-09-05 (×3): qty 1

## 2018-09-05 MED ORDER — AMLODIPINE BESYLATE 10 MG PO TABS
10.0000 mg | ORAL_TABLET | Freq: Every day | ORAL | Status: DC
  Administered 2018-09-06: 10 mg via ORAL
  Filled 2018-09-05: qty 1

## 2018-09-05 MED ORDER — NIFEDIPINE ER OSMOTIC RELEASE 60 MG PO TB24
60.0000 mg | ORAL_TABLET | Freq: Two times a day (BID) | ORAL | Status: DC
  Administered 2018-09-05: 60 mg via ORAL
  Filled 2018-09-05: qty 1

## 2018-09-05 NOTE — Progress Notes (Signed)
[facility]    NEPHROLOGY PROGRESS NOTE  SUBJECTIVE: Patient seen and examined.  Reports that she felt terrible with dizziness and drowsiness after taking the Procardia today..  Some improvements in blood pressure noted.  Denies chest pain, shortness of breath, nausea, vomiting, diarrhea or dysuria.  All other review of systems are negative.  OBJECTIVE:  Vitals:   09/04/18 2323 09/05/18 0556  BP: (!) 161/83 (!) 163/70  Pulse:  73  Resp:  17  Temp:  98.9 F (37.2 C)  SpO2:  99%    Intake/Output Summary (Last 24 hours) at 09/05/2018 1314 Last data filed at 09/04/2018 1900 Gross per 24 hour  Intake 480 ml  Output -  Net 480 ml      General:  AAOx3 NAD HEENT: MMM [place] AT anicteric sclera Neck:  No JVD, no adenopathy CV:  Heart RRR  Lungs:  L/S CTA bilaterally Abd:  abd SNT/ND with normal BS GU:  Bladder non-palpable Extremities:  trace LE edema.  Left upper extremity AV fistula. Skin:  No skin rash  MEDICATIONS:  . darbepoetin (ARANESP) injection - NON-DIALYSIS  40 mcg Subcutaneous Q Wed-1800  . febuxostat  40 mg Oral Daily  . heparin  5,000 Units Subcutaneous Q8H  . hydrALAZINE  100 mg Oral Q8H  . insulin aspart  0-9 Units Subcutaneous TID WC  . labetalol  400 mg Oral TID  . LORazepam  1 mg Oral QHS  . mupirocin ointment  1 application Nasal BID  . NIFEdipine  60 mg Oral BID  . sertraline  150 mg Oral Daily  . sevelamer carbonate  800 mg Oral TID WC  . sodium chloride flush  3 mL Intravenous Q12H       LABS:   CBC Latest Ref Rng & Units 08/31/2018 08/30/2018 07/02/2012  WBC 4.0 - 10.5 K/uL 6.5 7.7 10.3(A)  Hemoglobin 12.0 - 15.0 g/dL 8.7(L) 7.7(L) 14.7  Hematocrit 36.0 - 46.0 % 28.2(L) 25.7(L) 46.6  Platelets 150 - 400 K/uL 289 213 -    CMP Latest Ref Rng & Units 09/05/2018 09/04/2018 09/03/2018  Glucose 70 - 99 mg/dL 92 110(H) 120(H)  BUN 8 - 23 mg/dL 86(H) 86(H) 78(H)  Creatinine 0.44 - 1.00 mg/dL 7.58(H) 7.72(H) 7.79(H)  Sodium 135 - 145 mmol/L  140 142 141  Potassium 3.5 - 5.1 mmol/L 3.6 3.9 3.9  Chloride 98 - 111 mmol/L 105 106 102  CO2 22 - 32 mmol/L 21(L) 22 25  Calcium 8.9 - 10.3 mg/dL 8.8(L) 8.9 8.6(L)  Total Protein 6.0 - 8.3 g/dL - - -  Total Bilirubin 0.3 - 1.2 mg/dL - - -  Alkaline Phos 39 - 117 U/L - - -  AST 0 - 37 U/L - - -  ALT 0 - 35 U/L - - -    Lab Results  Component Value Date   CALCIUM 8.8 (L) 09/05/2018   CAION 1.18 02/20/2009   PHOS 5.3 (H) 09/05/2018       Component Value Date/Time   COLORURINE YELLOW 08/30/2018 2157   APPEARANCEUR HAZY (A) 08/30/2018 2157   LABSPEC 1.009 08/30/2018 2157   PHURINE 5.0 08/30/2018 2157   GLUCOSEU 50 (A) 08/30/2018 2157   HGBUR NEGATIVE 08/30/2018 2157   BILIRUBINUR NEGATIVE 08/30/2018 2157   BILIRUBINUR neg 07/02/2012 1616   KETONESUR NEGATIVE 08/30/2018 2157   PROTEINUR 100 (A) 08/30/2018 2157   UROBILINOGEN 0.2 07/02/2012 1616   UROBILINOGEN 1.0 05/12/2009 0939   NITRITE NEGATIVE 08/30/2018 2157   LEUKOCYTESUR SMALL (A)  08/30/2018 2157      Component Value Date/Time   PHART 7.391 04/21/2009 1315   PCO2ART 32.9 (L) 04/21/2009 1315   PO2ART 83.4 04/21/2009 1315   HCO3 18.8 (L) 04/21/2009 1315   TCO2 19.7 04/21/2009 1315   ACIDBASEDEF 4.7 (H) 04/21/2009 1315   O2SAT 95.4 04/21/2009 1315       Component Value Date/Time   IRON 17 (L) 08/30/2018 1430   TIBC 228 (L) 08/30/2018 1430   FERRITIN 226 08/30/2018 1430   IRONPCTSAT 7 (L) 08/30/2018 1430   IRONPCTSAT 12 (L) 01/25/2012 1431       ASSESSMENT/PLAN:    1. progressive CKD to stage 5- pt has been lost to follow up twice at our office and each time she presented again with advanced disease. Her workup in November revealed negative SPEP but she did not have further studies.  1. FeNa>1%, UA with +prot neg blood. 2. Renal USwith significant decrease in size compared to 2011 [place] suggesting progressive chronic medical renal disease. No obstruction 3. This likely represents progressive CKD and may be  now at ESRD. S/p LUE AVF 4. Will hold off on [facility] for now as she is reluctant to start HD at this time and is without uremic symptoms. 5. BUN/Cr have stabilized and is without uremic symptoms.  Will need close follow up with Dr. Joelyn Oms in our office after discharge and closely monitor for uremic symptoms and need for [facility] placement if this occurs before her avf is matured (3 months) 2. HTN- poorly controlled. Agree with resuming outpatient medications and will increase hydralazine to three times a day. 1. Switch Procardia to amlodipine 10 mg daily. 2. Change labetalol back to carvedilol 3. Vascular access- s/p L Brachiocephalic AVF creation 07/13/74 by Dr. Donnetta Hutching, hold off on [place] for now 4. Anemia of CKD - and also with low iron stores. Replete IV iron and will likely require ESA once her BP is under better control 1. nulicit 250mg  IV qd x 4 2. aranesp 40 mcg sq 5. Metabolic acidosis- due to #1. Will cont bicarb and follow -  improved 6. Secondary HPTH- elevated phos - on sevelamer, PTH pending 7. Pre-diabetes- elevated hgb a1c at 5.7% 8. Chronic systolic CHF- euvolemic on exam 9. HLD- not on meds 10. Gout- currently on prednisone. 11. Disposition- f/u next week in our office     [place], DO, [place]

## 2018-09-05 NOTE — Progress Notes (Signed)
PROGRESS NOTE    Chelsea Roberts  PYK:[id number] DOB: 1955-06-10 DOA: 08/30/2018 PCP: Lucianne Lei, MD   Brief Narrative: 63 year old female with a past medical history significant for poorly controlled hypertension, chronic systolic congestive heart failure, obstructive sleep apnea not on CPAP, gout, chronic kidney disease with a baseline creatinine of 3.51-year back presented to the emergency department with complaints of multiple flareups of gout in multiple joints over the past several weeks.  Patient was experiencing poorly, dizzy associated with the chest pain, having difficulty getting out of the chair secondary to generalized weakness came to the emergency department.  Work-up in the emergency department was found to have systolic blood pressure in 200s and of 7.5, gouty arthritis in multiple joints.  Nephrology is consulted, vascular surgery is consulted for AV graft placement.  ##Acute on chronic renal failure -Secondary to poorly controlled blood pressure with hypertensive nephropathy -Nephrology is following -Ultrasound of the kidneys showed a significant decrease in the size compared to 2011 -Underwent AV graft placement by vascular surgery 09/01/2018 to the left arm  ##Hypertension accelerated -Continue with hydralazine 100 mg every 8 hours, increased labetalol 400 mg 3 times a day -Keep the patient on as needed hydralazine, labetalol -Started the patient on Procardia XL 60 mg twice daily, only side effect patient had was lower extremity swelling.  Not a true allergy -Improving.  ##Anemia of chronic kidney disease -Give IV iron, Epogen -Hemoglobin stable  ##Gouty arthritis -discontinue the prednisone -Good improvement  ##Prediabetes -Hemoglobin A1c is 5.7  ##Chronic systolic congestive heart failure -Keep the patient on IV Lasix  ##Hyperlipidemia -Patient has elevated cholesterol, triglycerides, LDL -Continue with the statin  ##Anxiety/depression -Start the  patient on Zoloft -Discussed with the patient to avoid benzodiazepines  Assessment & Plan:   Principal Problem:   Acute renal failure superimposed on chronic kidney disease ([place]) Active Problems:   HTN (hypertension)   Hyperlipidemia   Systolic CHF, chronic (HCC)   Obstructive sleep apnea   Hypothyroidism   Gout   Prediabetes    DVT prophylaxis: Heparin Code Status: Full code  family Communication: Patient, husband  disposition Plan: Home   Consultants:   Nephrology  Vascular surgery  Procedures: AV graft  Subjective: Denies having any complaints.  Objective: Vitals:   09/04/18 2129 09/04/18 2323 09/05/18 0556 09/05/18 1431  BP: (!) 178/82 (!) 161/83 (!) 163/70 (!) 153/67  Pulse: 73  73 72  Resp: 15  17 19   Temp: 98.5 F (36.9 C)  98.9 F (37.2 C) 98.7 F (37.1 C)  TempSrc: Oral  Oral Oral  SpO2: 99%  99% 98%  Weight:      Height:        Intake/Output Summary (Last 24 hours) at 09/05/2018 1508 Last data filed at 09/04/2018 1900 Gross per 24 hour  Intake 480 ml  Output -  Net 480 ml   Filed Weights   08/30/18 0856 08/30/18 1551 09/01/18 1135  Weight: 89.8 kg 88.9 kg 88.9 kg    Examination:  General exam: Appears calm and comfortable  Respiratory system: Clear to auscultation. Respiratory effort normal. Cardiovascular system: S1 & S2 heard, RRR. No JVD, murmurs, rubs, gallops or clicks. No pedal edema. Gastrointestinal system: Abdomen is nondistended, soft and nontender. No organomegaly or masses felt. Normal bowel sounds heard. Central nervous system: Alert and oriented. No focal neurological deficits. Extremities: Symmetric 5 x 5 power.  Mild swelling in the antecubital area of the left arm Skin: No rashes, lesions or ulcers Psychiatry: Judgement  and insight appear normal. Mood & affect appropriate.     Data Reviewed: I have personally reviewed following labs and imaging studies  CBC: Recent Labs  Lab 08/30/18 0949 08/31/18 0229  WBC  7.7 6.5  HGB 7.7* 8.7*  HCT 25.7* 28.2*  MCV 81.1 79.2*  PLT 213 536   Basic Metabolic Panel: Recent Labs  Lab 08/30/18 1249  09/01/18 0303 09/02/18 0726 09/03/18 0356 09/04/18 0317 09/05/18 0327  NA  --    < > 140 142 141 142 140  K  --    < > 3.4* 3.3* 3.9 3.9 3.6  CL  --    < > 106 103 102 106 105  CO2  --    < > 20* 23 25 22  21*  GLUCOSE  --    < > 163* 99 120* 110* 92  BUN  --    < > 81* 74* 78* 86* 86*  CREATININE  --    < > 7.65* 7.65* 7.79* 7.72* 7.58*  CALCIUM  --    < > 8.8* 8.5* 8.6* 8.9 8.8*  MG 2.3  --   --   --   --   --   --   PHOS 6.8*   < > 5.2* 7.2* 6.4* 5.3* 5.3*   < > = values in this interval not displayed.   GFR: Estimated Creatinine Clearance: 8.7 mL/min (A) (by C-G formula based on SCr of 7.58 mg/dL (H)). Liver Function Tests: Recent Labs  Lab 09/01/18 0303 09/02/18 0726 09/03/18 0356 09/04/18 0317 09/05/18 0327  ALBUMIN 2.6* 2.6* 2.8* 2.9* 2.7*   No results for input(s): LIPASE, AMYLASE in the last 168 hours. No results for input(s): AMMONIA in the last 168 hours. Coagulation Profile: No results for input(s): INR, PROTIME in the last 168 hours. Cardiac Enzymes: No results for input(s): CKTOTAL, CKMB, CKMBINDEX, TROPONINI in the last 168 hours. BNP (last 3 results) No results for input(s): PROBNP in the last 8760 hours. HbA1C: No results for input(s): HGBA1C in the last 72 hours. CBG: Recent Labs  Lab 09/04/18 0751 09/04/18 1147 09/04/18 1656 09/05/18 0812 09/05/18 1226  GLUCAP 86 95 117* 95 118*   Lipid Profile: No results for input(s): CHOL, HDL, LDLCALC, TRIG, CHOLHDL, LDLDIRECT in the last 72 hours. Thyroid Function Tests: No results for input(s): TSH, T4TOTAL, FREET4, T3FREE, THYROIDAB in the last 72 hours. Anemia Panel: No results for input(s): VITAMINB12, FOLATE, FERRITIN, TIBC, IRON, RETICCTPCT in the last 72 hours. Sepsis Labs: No results for input(s): PROCALCITON, LATICACIDVEN in the last 168 hours.  Recent Results  (from the past 240 hour(s))  SARS CORONAVIRUS 2 Nasal Swab Aptima Multi Swab     Status: None   Collection Time: 08/30/18  1:03 PM   Specimen: Aptima Multi Swab; Nasal Swab  Result Value Ref Range Status   SARS Coronavirus 2 NEGATIVE NEGATIVE Final    Comment: (NOTE) SARS-CoV-2 target nucleic acids are NOT DETECTED. The SARS-CoV-2 RNA is generally detectable in upper and lower respiratory specimens during the acute phase of infection. Negative results do not preclude SARS-CoV-2 infection, do not rule out co-infections with other pathogens, and should not be used as the sole basis for treatment or other patient management decisions. Negative results must be combined with clinical observations, patient history, and epidemiological information. The expected result is Negative. Fact Sheet for Patients: SugarRoll.be Fact Sheet for Healthcare Providers: https://www.woods-mathews.com/ This test is not yet approved or cleared by the [place] FDA and  has been  authorized for detection and/or diagnosis of SARS-CoV-2 by FDA under an Emergency Use Authorization (EUA). This EUA will remain  in effect (meaning this test can be used) for the duration of the COVID-19 declaration under Section 56 4(b)(1) of the Act, 21 U.S.C. section 360bbb-3(b)(1), unless the authorization is terminated or revoked sooner. Performed at [facility], [place] [address], [place], [place] [address]   Surgical PCR screen     Status: Abnormal   Collection Time: 09/01/18  4:31 AM   Specimen: Nasal Mucosa; Nasal Swab  Result Value Ref Range Status   MRSA, PCR NEGATIVE NEGATIVE Final   Staphylococcus aureus POSITIVE (A) NEGATIVE Final    Comment: (NOTE) The Xpert SA Assay (FDA approved for NASAL specimens in patients 35 years of age and older), is one component of a comprehensive surveillance program. It is not intended to diagnose infection nor to guide or monitor  treatment. Performed at [facility], [place] [address], [place], [place] [address]          Radiology Studies: No results found.      Scheduled Meds: . [START ON 09/06/2018] amLODipine  10 mg Oral Daily  . carvedilol  25 mg Oral BID WC  . darbepoetin (ARANESP) injection - NON-DIALYSIS  40 mcg Subcutaneous Q Wed-1800  . febuxostat  40 mg Oral Daily  . heparin  5,000 Units Subcutaneous Q8H  . hydrALAZINE  100 mg Oral Q8H  . insulin aspart  0-9 Units Subcutaneous TID WC  . LORazepam  1 mg Oral QHS  . mupirocin ointment  1 application Nasal BID  . sertraline  150 mg Oral Daily  . sevelamer carbonate  800 mg Oral TID WC  . sodium chloride flush  3 mL Intravenous Q12H   Continuous Infusions:    LOS: 6 days    Time spent: 35 minutes    Delorus Langwell, MD [facility] Pager [phone]  If 7PM-7AM, please contact night-coverage [email or web address] Password [facility] 09/05/2018, 3:08 PM

## 2018-09-06 LAB — RENAL FUNCTION PANEL
Albumin: 2.9 g/dL — ABNORMAL LOW (ref 3.5–5.0)
Anion gap: 12 (ref 5–15)
BUN: 84 mg/dL — ABNORMAL HIGH (ref 8–23)
CO2: 20 mmol/L — ABNORMAL LOW (ref 22–32)
Calcium: 8.9 mg/dL (ref 8.9–10.3)
Chloride: 108 mmol/L (ref 98–111)
Creatinine, Ser: 7.64 mg/dL — ABNORMAL HIGH (ref 0.44–1.00)
GFR calc Af Amer: 6 mL/min — ABNORMAL LOW (ref >60)
GFR calc non Af Amer: 5 mL/min — ABNORMAL LOW (ref >60)
Glucose, Bld: 95 mg/dL (ref 70–99)
Phosphorus: 5.2 mg/dL — ABNORMAL HIGH (ref 2.5–4.6)
Potassium: 3.6 mmol/L (ref 3.5–5.1)
Sodium: 140 mmol/L (ref 135–145)

## 2018-09-06 LAB — GLUCOSE, CAPILLARY
Glucose-Capillary: 102 mg/dL — ABNORMAL HIGH (ref 70–99)
Glucose-Capillary: 98 mg/dL (ref 70–99)

## 2018-09-06 MED ORDER — LORAZEPAM 1 MG PO TABS: 1.0000 mg | ORAL_TABLET | Freq: Every day | ORAL | 0 refills | Status: DC

## 2018-09-06 MED ORDER — FUROSEMIDE 20 MG PO TABS
20.0000 mg | ORAL_TABLET | Freq: Every day | ORAL | Status: DC
  Administered 2018-09-06: 20 mg via ORAL
  Filled 2018-09-06: qty 1

## 2018-09-06 MED ORDER — HYDRALAZINE HCL 100 MG PO TABS: 100.0000 mg | ORAL_TABLET | Freq: Three times a day (TID) | ORAL | 2 refills | Status: AC

## 2018-09-06 MED ORDER — FUROSEMIDE 40 MG PO TABS: 40.0000 mg | ORAL_TABLET | Freq: Every day | ORAL | 11 refills | Status: DC

## 2018-09-06 MED ORDER — FUROSEMIDE 20 MG PO TABS: 20.0000 mg | ORAL_TABLET | Freq: Every day | ORAL | 0 refills | Status: DC

## 2018-09-06 MED ORDER — SEVELAMER CARBONATE 800 MG PO TABS: 800.0000 mg | ORAL_TABLET | Freq: Three times a day (TID) | ORAL | 0 refills | Status: AC

## 2018-09-06 MED ORDER — AMLODIPINE BESYLATE 10 MG PO TABS: 10.0000 mg | ORAL_TABLET | Freq: Every day | ORAL | 2 refills | Status: DC

## 2018-09-06 NOTE — Discharge Summary (Signed)
Physician Discharge Summary  Chelsea Roberts JSH:[id number] DOB: 07/08/1955 DOA: 08/30/2018  PCP: Lucianne Lei, MD  Admit date: 08/30/2018 Discharge date: 09/06/2018  Time spent: 40 minutes  Recommendations for Outpatient Follow-up:  Follow-up with primary care physician, nephrology  Discharge Diagnoses:  Principal Problem:   Acute renal failure superimposed on chronic kidney disease ([place]) Active Problems:   HTN (hypertension)   Hyperlipidemia   Systolic CHF, chronic ([place])   Obstructive sleep apnea   Hypothyroidism   Gout   Prediabetes   Discharge Condition: Stable  Diet recommendation: Renal  Filed Weights   08/30/18 1551 09/01/18 1135 09/06/18 0552  Weight: 88.9 kg 88.9 kg 91.2 kg    History of present illness and Hospital Course:  63 year old female with a past medical history significant for poorly controlled hypertension, chronic systolic congestive heart failure, obstructive sleep apnea not on CPAP, gout, chronic kidney disease with a baseline creatinine of 3.51-year back presented to the emergency department with complaints of multiple flareups of gout in multiple joints over the past several weeks.  Patient was experiencing poorly, dizzy associated with the chest pain, having difficulty getting out of the chair secondary to generalized weakness came to the emergency department.  Work-up in the emergency department was found to have systolic blood pressure in 200s and of 7.5, gouty arthritis in multiple joints.  Nephrology is consulted, vascular surgery is consulted for AV graft placement.  ##Acute on chronic renal failure -Secondary to poorly controlled blood pressure with hypertensive nephropathy -Nephrology is following -Ultrasound of the kidneys showed a significant decrease in the size compared to 2011 -Underwent AV graft placement by vascular surgery 09/01/2018 to the left arm  ##Hypertension accelerated -Continue with hydralazine 100 mg every 8 hours,  increased labetalol 400 mg 3 times a day -Keep the patient on as needed hydralazine, labetalol -Started the patient on Procardia XL 60 mg twice daily, only side effect patient had was lower extremity swelling.  Not a true allergy -Improving.  ##Anemia of chronic kidney disease -Give IV iron, Epogen -Hemoglobin stable  ##Gouty arthritis -discontinue the prednisone -Good improvement  ##Prediabetes -Hemoglobin A1c is 5.7  ##Chronic systolic congestive heart failure -Keep the patient on IV Lasix  ##Hyperlipidemia -Patient has elevated cholesterol, triglycerides, LDL -Continue with the statin  ##Anxiety/depression -Start the patient on Zoloft -Discussed with the patient to avoid benzodiazepines      Consultations:  Nephrology  Discharge Exam: Vitals:   09/06/18 0831 09/06/18 1350  BP: (!) 175/72 (!) 171/75  Pulse:  73  Resp:  16  Temp:  98.4 F (36.9 C)  SpO2:  100%    General exam: Appears calm and comfortable  Respiratory system: Clear to auscultation. Respiratory effort normal. Cardiovascular system: S1 & S2 heard, RRR. No JVD, murmurs, rubs, gallops or clicks. No pedal edema. Gastrointestinal system: Abdomen is nondistended, soft and nontender. No organomegaly or masses felt. Normal bowel sounds heard. Central nervous system: Alert and oriented. No focal neurological deficits. Extremities: Symmetric 5 x 5 power.  Mild swelling in the antecubital area of the left arm Skin: No rashes, lesions or ulcers Psychiatry: Judgement and insight appear normal. Mood & affect appropriate.  Discharge Instructions   Discharge Instructions    Diet - low sodium heart healthy   Complete by: As directed    Discharge instructions   Complete by: As directed    Follow-up with primary care physician, nephrology in 1 week   Increase activity slowly   Complete by: As directed  Allergies as of 09/06/2018      Reactions   Sulfa Antibiotics Swelling   Tekturna  [aliskiren] Swelling   Lisinopril Cough   Clonidine Derivatives    Extreme dry mouth   Diovan [valsartan]    headache   Hctz [hydrochlorothiazide]    gout   Norvasc [amlodipine Besylate] Swelling   In legs and feet   Procardia [nifedipine] Swelling   In legs and feet      Medication List    STOP taking these medications   colchicine 0.6 MG tablet     TAKE these medications   amLODipine 10 MG tablet Commonly known as: NORVASC Take 1 tablet (10 mg total) by mouth daily.   carvedilol 25 MG tablet Commonly known as: COREG Take 25 mg by mouth 2 (two) times daily with a meal.   Febuxostat 80 MG Tabs Commonly known as: Uloric Take 1 tablet (80 mg total) by mouth daily. What changed: how much to take   furosemide 20 MG tablet Commonly known as: LASIX Take 1 tablet (20 mg total) by mouth daily.   furosemide 40 MG tablet Commonly known as: Lasix Take 1 tablet (40 mg total) by mouth daily.   hydrALAZINE 100 MG tablet Commonly known as: APRESOLINE Take 1 tablet (100 mg total) by mouth 3 (three) times daily. What changed:   medication strength  when to take this   HYDROcodone-acetaminophen 5-325 MG tablet Commonly known as: NORCO/VICODIN Take 1-2 tablets by mouth every 6 (six) hours as needed for moderate pain or severe pain.   LORazepam 1 MG tablet Commonly known as: ATIVAN Take 1 tablet (1 mg total) by mouth at bedtime. What changed: when to take this   multivitamin with minerals Tabs tablet Take 1 tablet by mouth daily.   sertraline 100 MG tablet Commonly known as: ZOLOFT Take 1.5 tablets (150 mg total) by mouth daily. PATIENT NEEDS OFFICE VISIT FOR ADDITIONAL REFILLS What changed: additional instructions   sevelamer carbonate 800 MG tablet Commonly known as: RENVELA Take 1 tablet (800 mg total) by mouth 3 (three) times daily with meals.      Allergies  Allergen Reactions  . Sulfa Antibiotics Swelling  . Tekturna [Aliskiren] Swelling  . Lisinopril  Cough  . Clonidine Derivatives     Extreme dry mouth  . Diovan [Valsartan]     headache  . Hctz [Hydrochlorothiazide]     gout  . Norvasc [Amlodipine Besylate] Swelling    In legs and feet  . Procardia [Nifedipine] Swelling    In legs and feet   Follow-up Information    [facility] In 6 weeks.   Specialty: Vascular Surgery Why: Office will call you to arrange your appt (sent) Contact information: [place] Follow up on 09/29/2018.   Why: 9:30 am with Dr.Newlin Contact information: [address] [place] [address] [phone]           The results of significant diagnostics from this hospitalization (including imaging, microbiology, ancillary and laboratory) are listed below for reference.    Significant Diagnostic Studies: Dg Chest 2 View  Result Date: 08/30/2018 CLINICAL DATA:  Tachypnea.  Chest pain. EXAM: CHEST - 2 VIEW COMPARISON:  08/30/18 FINDINGS: Cardiac enlargement, unchanged. No pleural effusion. Increase patchy scratch set pulmonary vascular congestion is identified. New patchy opacity within the left midlung identified which may represent asymmetric edema versus pneumonia. IMPRESSION:  1. Cardiac enlargement and pulmonary vascular congestion with new patchy left midlung opacity which may represent asymmetric edema versus pneumonia. Electronically Signed   By: Kerby Moors M.D.   On: 08/30/2018 18:09   US Renal  Result Date: 08/30/2018 CLINICAL DATA:  Acute renal failure. EXAM: RENAL / URINARY TRACT ULTRASOUND COMPLETE COMPARISON:  CT, 04/19/2009. FINDINGS: Right Kidney: Renal measurements: 9.9 x 4.8 x 5.4 cm = volume: 135.7 mL. Increased parenchymal echogenicity. Multiple cysts. In the midpole, at the midpole, there is a cortical cyst in a thin septation, but no other complicating feature, measuring 1.9 x 1.5 x 1.6 cm.  There is also a hyperechoic renal mass, lower pole, measuring 1.5 x 1.3 x 1.5 cm. This corresponds to an angiomyolipoma present on the prior CT. No stones. No hydronephrosis. Left Kidney: Renal measurements: 9.0 x 4.7 x 5.3 cm = volume: 119.3 mL. Increased parenchymal echogenicity. Simple appearing midpole cyst measuring 2.0 x 1.6 x 1.9 cm. No stones. No hydronephrosis. Bladder: Appears normal for degree of bladder distention. Hyperechoic lesion in the spleen measuring 1.6 x 1.4 x 1.6 cm, not evident on the prior CT, likely a hemangioma. IMPRESSION: 1. No acute findings. 2. Increased renal parenchymal echogenicity consistent with medical renal disease. 3. Renal cysts.  Right renal angiomyolipoma. 4. No stones.  No hydronephrosis. Electronically Signed   By: Lajean Manes M.D.   On: 08/30/2018 19:02   Dg Chest Port 1 View  Result Date: 08/30/2018 CLINICAL DATA:  Shortness of breath EXAM: PORTABLE CHEST 1 VIEW COMPARISON:  Chest radiograph 05/05/2012 FINDINGS: Cardiomegaly. Uncoiled and atherosclerotic thoracic aorta. There is increased density which projects over the medial left mediastinum, which was not well appreciated on prior exams. Shallow inspiration radiograph with crowding of the central bronchovascular markings. Mild ill-defined opacity at the left lung base with mild indistinctness of the left hemidiaphragm. No evidence of pleural effusion or pneumothorax. No acute bony abnormality IMPRESSION: Shallow inspiration radiograph. Cardiomegaly. Mild ill-defined opacity at the left lung base may reflect atelectasis. Pneumonia cannot be excluded. Indeterminate increased density projecting over the medial left mediastinum, which was not well appreciated on prior exams. Recommend PA and lateral radiographs for further characterization when clinically feasible. Electronically Signed   By: Kellie Simmering   On: 08/30/2018 10:09   Vas [place] Upper Ext Vein Mapping (pre-op Avf)  Result Date: 08/31/2018 UPPER EXTREMITY  VEIN MAPPING  Indications: Pre-access. Comparison Study: No previous study available to Performing Technologist: Toma Copier RVS  Examination Guidelines: A complete evaluation includes B-mode imaging, spectral Doppler, color Doppler, and power Doppler as needed of all accessible portions of each vessel. Bilateral testing is considered an integral part of a complete examination. Limited examinations for reoccurring indications may be performed as noted. +-----------------+------------+---------+------------------------------------+ Right Cephalic     Diameter    Depth                Findings                                    (cm)      (cm)                                        +-----------------+------------+---------+------------------------------------+ Shoulder             0.38      1.11                                        +-----------------+------------+---------+------------------------------------+  Prox upper arm       0.44      1.52                                        +-----------------+------------+---------+------------------------------------+ Mid upper arm        0.44      0.77                                        +-----------------+------------+---------+------------------------------------+ Dist upper arm       0.48      0.67                                        +-----------------+------------+---------+------------------------------------+ Antecubital fossa    0.41      0.26                                        +-----------------+------------+---------+------------------------------------+ Prox forearm         0.38      0.39                                        +-----------------+------------+---------+------------------------------------+ Mid forearm          0.29      0.44                                        +-----------------+------------+---------+------------------------------------+ Wrist                                     not visualized and IVsite and                                                         dressing               +-----------------+------------+---------+------------------------------------+ +-----------------+-------------+----------+---------+ Right Basilic    Diameter (cm)Depth (cm)Findings  +-----------------+-------------+----------+---------+ Mid upper arm        0.46        1.34             +-----------------+-------------+----------+---------+ Dist upper arm       0.27        0.83             +-----------------+-------------+----------+---------+ Antecubital fossa    0.28        0.34             +-----------------+-------------+----------+---------+ Prox forearm         0.23        0.24             +-----------------+-------------+----------+---------+ Mid forearm          0.22  0.19             +-----------------+-------------+----------+---------+ Wrist                0.17        0.23   branching +-----------------+-------------+----------+---------+ +-----------------+-------------+----------+--------+ Left Cephalic    Diameter (cm)Depth (cm)Findings +-----------------+-------------+----------+--------+ Shoulder             0.40        1.40            +-----------------+-------------+----------+--------+ Prox upper arm       0.36        0.65            +-----------------+-------------+----------+--------+ Mid upper arm        0.38        0.90            +-----------------+-------------+----------+--------+ Dist upper arm       0.37        0.60            +-----------------+-------------+----------+--------+ Antecubital fossa    0.43        0.36            +-----------------+-------------+----------+--------+ Prox forearm         0.46        0.42            +-----------------+-------------+----------+--------+ Mid forearm          0.30        0.62            +-----------------+-------------+----------+--------+ Wrist                 0.13        0.44            +-----------------+-------------+----------+--------+ +-----------------+-------------+----------+-----------------------------------+ Left Basilic     Diameter (cm)Depth (cm)             Findings               +-----------------+-------------+----------+-----------------------------------+ Mid upper arm        0.46        2.60                                       +-----------------+-------------+----------+-----------------------------------+ Dist upper arm       0.34        1.11                                       +-----------------+-------------+----------+-----------------------------------+ Antecubital fossa    0.31        0.63                                       +-----------------+-------------+----------+-----------------------------------+ Prox forearm                                      not visualized            +-----------------+-------------+----------+-----------------------------------+ Mid forearm                                branching, not visualized  and                                               multiple branching unable to                                                          identify               +-----------------+-------------+----------+-----------------------------------+ Wrist                                       not visualized and Too smal     +-----------------+-------------+----------+-----------------------------------+ *See table(s) above for measurements and observations.  Diagnosing physician: Harold Barban MD Electronically signed by Harold Barban MD on 08/31/2018 at 8:44:32 PM.    Final     Microbiology: Recent Results (from the past 240 hour(s))  SARS CORONAVIRUS 2 Nasal Swab Aptima Multi Swab     Status: None   Collection Time: 08/30/18  1:03 PM   Specimen: Aptima Multi Swab; Nasal Swab  Result Value Ref Range Status   SARS Coronavirus 2 NEGATIVE NEGATIVE Final     Comment: (NOTE) SARS-CoV-2 target nucleic acids are NOT DETECTED. The SARS-CoV-2 RNA is generally detectable in upper and lower respiratory specimens during the acute phase of infection. Negative results do not preclude SARS-CoV-2 infection, do not rule out co-infections with other pathogens, and should not be used as the sole basis for treatment or other patient management decisions. Negative results must be combined with clinical observations, patient history, and epidemiological information. The expected result is Negative. Fact Sheet for Patients: SugarRoll.be Fact Sheet for Healthcare Providers: https://www.woods-mathews.com/ This test is not yet approved or cleared by the [place] FDA and  has been authorized for detection and/or diagnosis of SARS-CoV-2 by FDA under an Emergency Use Authorization (EUA). This EUA will remain  in effect (meaning this test can be used) for the duration of the COVID-19 declaration under Section 56 4(b)(1) of the Act, 21 U.S.C. section 360bbb-3(b)(1), unless the authorization is terminated or revoked sooner. Performed at [facility], [place] [address] [place], [place] [address]   Surgical PCR screen     Status: Abnormal   Collection Time: 09/01/18  4:31 AM   Specimen: Nasal Mucosa; Nasal Swab  Result Value Ref Range Status   MRSA, PCR NEGATIVE NEGATIVE Final   Staphylococcus aureus POSITIVE (A) NEGATIVE Final    Comment: (NOTE) The Xpert SA Assay (FDA approved for NASAL specimens in patients 75 years of age and older), is one component of a comprehensive surveillance program. It is not intended to diagnose infection nor to guide or monitor treatment. Performed at [facility], [place] [address], [place], [place] [address]      Labs: Basic Metabolic Panel: Recent Labs  Lab 09/02/18 0726 09/03/18 0356 09/04/18 0317 09/05/18 0327 09/06/18 0429  NA 142 141 142 140 140  K 3.3* 3.9 3.9  3.6 3.6  CL 103 102 106 105 108  CO2 23 25 22  21* 20*  GLUCOSE 99 120* 110* 92 95  BUN 74* 78* 86*  86* 84*  CREATININE 7.65* 7.79* 7.72* 7.58* 7.64*  CALCIUM 8.5* 8.6* 8.9 8.8* 8.9  PHOS 7.2* 6.4* 5.3* 5.3* 5.2*   Liver Function Tests: Recent Labs  Lab 09/02/18 0726 09/03/18 0356 09/04/18 0317 09/05/18 0327 09/06/18 0429  ALBUMIN 2.6* 2.8* 2.9* 2.7* 2.9*   No results for input(s): LIPASE, AMYLASE in the last 168 hours. No results for input(s): AMMONIA in the last 168 hours. CBC: Recent Labs  Lab 08/31/18 0229  WBC 6.5  HGB 8.7*  HCT 28.2*  MCV 79.2*  PLT 289   Cardiac Enzymes: No results for input(s): CKTOTAL, CKMB, CKMBINDEX, TROPONINI in the last 168 hours. BNP: BNP (last 3 results) No results for input(s): BNP in the last 8760 hours.  ProBNP (last 3 results) No results for input(s): PROBNP in the last 8760 hours.  CBG: Recent Labs  Lab 09/05/18 1226 09/05/18 1716 09/05/18 2127 09/06/18 0746 09/06/18 1207  GLUCAP 118* 86 146* 102* 98       Signed:  Carnella Fryman MD.  [facility] 09/06/2018, 2:19 PM

## 2018-09-06 NOTE — Progress Notes (Signed)
Nsg Discharge Note  Admit Date:  08/30/2018 Discharge date: 09/06/2018   Fayne Norrie to be D/C'd Home per MD order.  AVS completed.  Copy for chart, and copy for patient signed, and dated. Patient/caregiver able to verbalize understanding.  Discharge Medication: Allergies as of 09/06/2018      Reactions   Sulfa Antibiotics Swelling   Tekturna [aliskiren] Swelling   Lisinopril Cough   Clonidine Derivatives    Extreme dry mouth   Diovan [valsartan]    headache   Hctz [hydrochlorothiazide]    gout   Norvasc [amlodipine Besylate] Swelling   In legs and feet   Procardia [nifedipine] Swelling   In legs and feet      Medication List    STOP taking these medications   colchicine 0.6 MG tablet     TAKE these medications   amLODipine 10 MG tablet Commonly known as: NORVASC Take 1 tablet (10 mg total) by mouth daily.   carvedilol 25 MG tablet Commonly known as: COREG Take 25 mg by mouth 2 (two) times daily with a meal.   Febuxostat 80 MG Tabs Commonly known as: Uloric Take 1 tablet (80 mg total) by mouth daily. What changed: how much to take   furosemide 20 MG tablet Commonly known as: LASIX Take 1 tablet (20 mg total) by mouth daily.   furosemide 40 MG tablet Commonly known as: Lasix Take 1 tablet (40 mg total) by mouth daily.   hydrALAZINE 100 MG tablet Commonly known as: APRESOLINE Take 1 tablet (100 mg total) by mouth 3 (three) times daily. What changed:   medication strength  when to take this   HYDROcodone-acetaminophen 5-325 MG tablet Commonly known as: NORCO/VICODIN Take 1-2 tablets by mouth every 6 (six) hours as needed for moderate pain or severe pain.   LORazepam 1 MG tablet Commonly known as: ATIVAN Take 1 tablet (1 mg total) by mouth at bedtime. What changed: when to take this   multivitamin with minerals Tabs tablet Take 1 tablet by mouth daily.   sertraline 100 MG tablet Commonly known as: ZOLOFT Take 1.5 tablets (150 mg total) by  mouth daily. PATIENT NEEDS OFFICE VISIT FOR ADDITIONAL REFILLS What changed: additional instructions   sevelamer carbonate 800 MG tablet Commonly known as: RENVELA Take 1 tablet (800 mg total) by mouth 3 (three) times daily with meals.       Discharge Assessment: Vitals:   09/06/18 0831 09/06/18 1350  BP: (!) 175/72 (!) 171/75  Pulse:  73  Resp:  16  Temp:  98.4 F (36.9 C)  SpO2:  100%   Skin clean, dry and intact without evidence of skin break down, no evidence of skin tears noted. IV catheter discontinued intact. Site without signs and symptoms of complications - no redness or edema noted at insertion site, patient denies c/o pain - only slight tenderness at site.  Dressing with slight pressure applied.  D/c Instructions-Education: Discharge instructions given to patient/family with verbalized understanding. D/c education completed with patient/family including follow up instructions, medication list, d/c activities limitations if indicated, with other d/c instructions as indicated by MD - patient able to verbalize understanding, all questions fully answered. Patient instructed to return to ED, call 911, or call MD for any changes in condition.  Patient escorted via [place], and D/C home via private auto.  Tresa Endo, RN 09/06/2018 3:04 PM

## 2018-09-06 NOTE — TOC Transition Note (Addendum)
Transition of Care [facility]) - CM/SW Discharge Note   Patient Details  Name: JENNIFIER SMITHERMAN MRN: [id number] Date of Birth: [birth year]  [facility] CM/SW Contact:  Sharin Mons, RN Phone Number: 09/06/2018, 3:12 PM   Clinical Narrative:    Admitted with Acute renal failure superimposed on chronic kidney disease. From home with family. Independent with ADL's , no DME usage. Pt will transition to home today. States will f/u with PCP for post hospital appointment. States without insurance, no job, limited incomes. States can't afford Rx meds. NCM provided Match Letter to assist with med cost. States has transportation to home.  Andie Mungin (Spouse)      [phone]       PCP: Lucianne Lei  Final next level of care: Home/Self Care Barriers to Discharge: No Barriers Identified   Patient Goals and CMS Choice        Discharge Placement                       Discharge Plan and Services                DME Arranged: N/A DME Agency: NA       HH Arranged: NA HH Agency: NA        Social Determinants of Health (SDOH) Interventions     Readmission Risk Interventions No flowsheet data found.

## 2018-09-06 NOTE — Plan of Care (Signed)
Patient still having difficulty with blood pressure management requiring PRNs.

## 2018-09-06 NOTE — Progress Notes (Signed)
[facility]    NEPHROLOGY PROGRESS NOTE  SUBJECTIVE: Patient seen and examined.  Feeling better today.  Some improvements in blood pressure noted.  Denies chest pain, shortness of breath, nausea, vomiting, diarrhea or dysuria.  All other review of systems are negative.  OBJECTIVE:  Vitals:   09/06/18 0552 09/06/18 0831  BP: (!) 177/79 (!) 175/72  Pulse: 71   Resp: 20   Temp: 97.9 F (36.6 C)   SpO2: 100%     Intake/Output Summary (Last 24 hours) at 09/06/2018 1150 Last data filed at 09/06/2018 0109 Gross per 24 hour  Intake 182 ml  Output -  Net 182 ml      General:  AAOx3 NAD HEENT: MMM  AT anicteric sclera Neck:  No JVD, no adenopathy CV:  Heart RRR  Lungs:  L/S CTA bilaterally Abd:  abd SNT/ND with normal BS GU:  Bladder non-palpable Extremities:  trace LE edema.  Left upper extremity AV fistula. Skin:  No skin rash  MEDICATIONS:  . amLODipine  10 mg Oral Daily  . carvedilol  25 mg Oral BID WC  . darbepoetin (ARANESP) injection - NON-DIALYSIS  40 mcg Subcutaneous Q Wed-1800  . febuxostat  40 mg Oral Daily  . heparin  5,000 Units Subcutaneous Q8H  . hydrALAZINE  100 mg Oral Q8H  . insulin aspart  0-9 Units Subcutaneous TID WC  . LORazepam  1 mg Oral QHS  . sertraline  150 mg Oral Daily  . sevelamer carbonate  800 mg Oral TID WC  . sodium chloride flush  3 mL Intravenous Q12H       LABS:   CBC Latest Ref Rng & Units 08/31/2018 08/30/2018 07/02/2012  WBC 4.0 - 10.5 K/uL 6.5 7.7 10.3(A)  Hemoglobin 12.0 - 15.0 g/dL 8.7(L) 7.7(L) 14.7  Hematocrit 36.0 - 46.0 % 28.2(L) 25.7(L) 46.6  Platelets 150 - 400 K/uL 289 213 -    CMP Latest Ref Rng & Units 09/06/2018 09/05/2018 09/04/2018  Glucose 70 - 99 mg/dL 95 92 110(H)  BUN 8 - 23 mg/dL 84(H) 86(H) 86(H)  Creatinine 0.44 - 1.00 mg/dL 7.64(H) 7.58(H) 7.72(H)  Sodium 135 - 145 mmol/L 140 140 142  Potassium 3.5 - 5.1 mmol/L 3.6 3.6 3.9  Chloride 98 - 111 mmol/L 108 105 106  CO2 22 - 32 mmol/L 20(L)  21(L) 22  Calcium 8.9 - 10.3 mg/dL 8.9 8.8(L) 8.9  Total Protein 6.0 - 8.3 g/dL - - -  Total Bilirubin 0.3 - 1.2 mg/dL - - -  Alkaline Phos 39 - 117 U/L - - -  AST 0 - 37 U/L - - -  ALT 0 - 35 U/L - - -    Lab Results  Component Value Date   CALCIUM 8.9 09/06/2018   CAION 1.18 02/20/2009   PHOS 5.2 (H) 09/06/2018       Component Value Date/Time   COLORURINE YELLOW 08/30/2018 2157   APPEARANCEUR HAZY (A) 08/30/2018 2157   LABSPEC 1.009 08/30/2018 2157   PHURINE 5.0 08/30/2018 2157   GLUCOSEU 50 (A) 08/30/2018 2157   HGBUR NEGATIVE 08/30/2018 2157   BILIRUBINUR NEGATIVE 08/30/2018 2157   BILIRUBINUR neg 07/02/2012 1616   KETONESUR NEGATIVE 08/30/2018 2157   PROTEINUR 100 (A) 08/30/2018 2157   UROBILINOGEN 0.2 07/02/2012 1616   UROBILINOGEN 1.0 05/12/2009 0939   NITRITE NEGATIVE 08/30/2018 2157   LEUKOCYTESUR SMALL (A) 08/30/2018 2157      Component Value Date/Time   PHART 7.391 04/21/2009 1315   PCO2ART 32.9 (  L) 04/21/2009 1315   PO2ART 83.4 04/21/2009 1315   HCO3 18.8 (L) 04/21/2009 1315   TCO2 19.7 04/21/2009 1315   ACIDBASEDEF 4.7 (H) 04/21/2009 1315   O2SAT 95.4 04/21/2009 1315       Component Value Date/Time   IRON 17 (L) 08/30/2018 1430   TIBC 228 (L) 08/30/2018 1430   FERRITIN 226 08/30/2018 1430   IRONPCTSAT 7 (L) 08/30/2018 1430   IRONPCTSAT 12 (L) 01/25/2012 1431       ASSESSMENT/PLAN:    1. progressive CKD to stage 5- pt has been lost to follow up twice at our office and each time she presented again with advanced disease. Her workup in November revealed negative SPEP but she did not have further studies.  1. FeNa>1%, UA with +prot neg blood. 2. Renal USwith significant decrease in size compared to 2011 [place] suggesting progressive chronic medical renal disease. No obstruction 3. This likely represents progressive CKD and may be now at ESRD. S/p LUE AVF 4. Will hold off on [facility] for now as she is reluctant to start HD at this time and is without  uremic symptoms. 5. BUN/Cr have stabilized and is without uremic symptoms.  Will need close follow up with Dr. Joelyn Oms in our office after discharge and closely monitor for uremic symptoms and need for [facility] placement if this occurs before her avf is matured (3 months) 2. HTN- poorly controlled.  1. We will add furosemide 20 mg once daily p.o. today. 2. May need to restart RAS blockade. 3.  Advised the patient we may need to start dialysis sooner rather than later due to poorly controlled blood pressures. 3. Vascular access- s/p L Brachiocephalic AVF creation 6/78/93 by Dr. Donnetta Hutching, hold off on [facility] for now 4. Anemia of CKD - and also with low iron stores. Replete IV iron and will likely require ESA once her BP is under better control 1. nulicit 250mg  IV qd x 4 2. aranesp 40 mcg sq 5. Metabolic acidosis- due to #1. Will cont bicarb and follow -  improved 6. Secondary HPTH- elevated phos - on sevelamer, PTH pending 7. Pre-diabetes- elevated hgb a1c at 5.7% 8. Chronic systolic CHF- euvolemic on exam 9. HLD- not on meds 10. Gout- currently on prednisone. 11. Disposition- f/u next week in our office     [place], DO, [place]

## 2018-09-29 ENCOUNTER — Inpatient Hospital Stay: Payer: Self-pay | Admitting: Family Medicine

## 2018-10-04 ENCOUNTER — Emergency Department (HOSPITAL_COMMUNITY)
Admission: EM | Admit: 2018-10-04 | Discharge: 2018-10-05 | Disposition: A | Discharge status: Home / Self Care | Payer: Medicaid Other | Attending: Emergency Medicine | Admitting: Emergency Medicine

## 2018-10-04 ENCOUNTER — Other Ambulatory Visit: Payer: Self-pay

## 2018-10-04 ENCOUNTER — Emergency Department (HOSPITAL_COMMUNITY): Payer: Medicaid Other

## 2018-10-04 ENCOUNTER — Encounter (HOSPITAL_COMMUNITY): Payer: Self-pay | Admitting: Pharmacy Technician

## 2018-10-04 DIAGNOSIS — Z79899 Other long term (current) drug therapy: Secondary | ICD-10-CM | POA: Diagnosis not present

## 2018-10-04 DIAGNOSIS — I130 Hypertensive heart and chronic kidney disease with heart failure and stage 1 through stage 4 chronic kidney disease, or unspecified chronic kidney disease: Secondary | ICD-10-CM | POA: Insufficient documentation

## 2018-10-04 DIAGNOSIS — R0602 Shortness of breath: Secondary | ICD-10-CM | POA: Diagnosis not present

## 2018-10-04 DIAGNOSIS — E039 Hypothyroidism, unspecified: Secondary | ICD-10-CM | POA: Insufficient documentation

## 2018-10-04 DIAGNOSIS — I509 Heart failure, unspecified: Secondary | ICD-10-CM | POA: Insufficient documentation

## 2018-10-04 DIAGNOSIS — N189 Chronic kidney disease, unspecified: Secondary | ICD-10-CM | POA: Diagnosis not present

## 2018-10-04 LAB — CBC
HCT: 30.6 % — ABNORMAL LOW (ref 36.0–46.0)
Hemoglobin: 9 g/dL — ABNORMAL LOW (ref 12.0–15.0)
MCH: 25.1 pg — ABNORMAL LOW (ref 26.0–34.0)
MCHC: 29.4 g/dL — ABNORMAL LOW (ref 30.0–36.0)
MCV: 85.2 fL (ref 80.0–100.0)
Platelets: 208 10*3/uL (ref 150–400)
RBC: 3.59 MIL/uL — ABNORMAL LOW (ref 3.87–5.11)
RDW: 16.5 % — ABNORMAL HIGH (ref 11.5–15.5)
WBC: 6.8 10*3/uL (ref 4.0–10.5)
nRBC: 0 % (ref 0.0–0.2)

## 2018-10-04 LAB — BASIC METABOLIC PANEL
Anion gap: 18 — ABNORMAL HIGH (ref 5–15)
BUN: 71 mg/dL — ABNORMAL HIGH (ref 8–23)
CO2: 13 mmol/L — ABNORMAL LOW (ref 22–32)
Calcium: 10 mg/dL (ref 8.9–10.3)
Chloride: 105 mmol/L (ref 98–111)
Creatinine, Ser: 7.17 mg/dL — ABNORMAL HIGH (ref 0.44–1.00)
GFR calc Af Amer: 7 mL/min — ABNORMAL LOW (ref >60)
GFR calc non Af Amer: 6 mL/min — ABNORMAL LOW (ref >60)
Glucose, Bld: 174 mg/dL — ABNORMAL HIGH (ref 70–99)
Potassium: 4.1 mmol/L (ref 3.5–5.1)
Sodium: 136 mmol/L (ref 135–145)

## 2018-10-04 MED ORDER — SODIUM CHLORIDE 0.9% FLUSH: 3.0000 mL | Freq: Once | INTRAVENOUS | Status: DC

## 2018-10-04 NOTE — ED Triage Notes (Signed)
Pt arrives POV with reports of SOB onset today. Pt 100% on RA and talking in complete sentences. Hx CHF.

## 2018-10-05 LAB — TROPONIN I (HIGH SENSITIVITY)
Troponin I (High Sensitivity): 22 ng/L — ABNORMAL HIGH (ref <18)
Troponin I (High Sensitivity): 23 ng/L — ABNORMAL HIGH (ref <18)

## 2018-10-05 LAB — URINALYSIS, ROUTINE W REFLEX MICROSCOPIC
Bilirubin Urine: NEGATIVE
Glucose, UA: NEGATIVE mg/dL
Ketones, ur: NEGATIVE mg/dL
Nitrite: NEGATIVE
Protein, ur: 100 mg/dL — AB
Specific Gravity, Urine: 1.005 (ref 1.005–1.030)
pH: 6 (ref 5.0–8.0)

## 2018-10-05 LAB — BRAIN NATRIURETIC PEPTIDE: B Natriuretic Peptide: 207.8 pg/mL — ABNORMAL HIGH (ref 0.0–100.0)

## 2018-10-05 MED ORDER — CEPHALEXIN 500 MG PO CAPS: 1000.0000 mg | ORAL_CAPSULE | Freq: Two times a day (BID) | ORAL | 0 refills | Status: DC

## 2018-10-05 NOTE — Discharge Instructions (Addendum)
Return here as needed.  Follow-up with your doctor soon as possible.  Your x-rays and laboratory test did not show any significant abnormalities that were new.  You do have a urinary tract infection which we will treat for.

## 2018-10-05 NOTE — ED Notes (Signed)
Pt states her breathing is better and doesn't need oxygen. O2 sats 100% RA.

## 2018-10-05 NOTE — ED Notes (Signed)
Pt verbalized understanding of discharge paperwork, prescriptions and follow-up care 

## 2018-10-05 NOTE — ED Provider Notes (Addendum)
[facility] EMERGENCY DEPARTMENT Provider Note   CSN: [id number] Arrival date & time: 10/04/18  2032     History   Chief Complaint Chief Complaint  Patient presents with  . Shortness of Breath    HPI Chelsea Roberts is a 63 y.o. female.     HPI Patient presents to the emergency department with shortness of breath started 2 days ago.  The patient states that it seemed to get little bit worse yesterday and that is what brought her to the emergency department.  The patient states that she had similar episodes in the past.  She states she is due to start dialysis in the near future.  The patient states that her doctor was concerned she may be having some fluid overload and that is what brought her to the emergency department.  The patient states she is feeling better since she is been here.  She states she was placed on oxygen for short period of time and that made her improved.  The patient denies chest pain, headache,blurred vision, neck pain, fever, cough, weakness, numbness, dizziness, anorexia, edema, abdominal pain, nausea, vomiting, diarrhea, rash, back pain, dysuria, hematemesis, bloody stool, near syncope, or syncope. Past Medical History:  Diagnosis Date  . Anxiety   . Breast mass   . Chronic kidney disease   . Complication of anesthesia    severe htn-had to take 2 ativan preop  . Depression   . Gout   . Hyperlipidemia   . Hypertension   . Pre-diabetes   . Sleep apnea    NO CPAP use per pt., lost 70lbs  . Systolic CHF, chronic ([place]) 2013    Patient Active Problem List   Diagnosis Date Noted  . Acute renal failure superimposed on chronic kidney disease ([place]) 08/30/2018  . Prediabetes 05/31/2012  . Breast mass in female 03/20/2012  . Gout 11/05/2011  . Chest pain 04/03/2011  . HTN (hypertension) 04/03/2011  . Hyperlipidemia 04/03/2011  . Chronic kidney disease 04/03/2011  . Systolic CHF, chronic ([place]) 04/03/2011  . Obstructive sleep apnea  04/03/2011  . Hypothyroidism 04/03/2011    Past Surgical History:  Procedure Laterality Date  . ABDOMINAL HYSTERECTOMY  1999   partial  . AV FISTULA PLACEMENT Left 09/01/2018   Procedure: CREATION OF ARTERIOVENOUS (AV) FISTULA LEFT ARM;  Surgeon: [name], MD;  Location: [place];  Service: Vascular;  Laterality: Left;  . BREAST BIOPSY  01/01/2012   Procedure: BREAST BIOPSY WITH NEEDLE LOCALIZATION;  Surgeon: Haywood Lasso, MD;  Location: [place];  Service: General;  Laterality: Right;  Needle localization excision Right breast mass  . BREAST CYST EXCISION  12/2011  . BREAST LUMPECTOMY WITH RADIOACTIVE SEED LOCALIZATION Left 10/09/2017   Procedure: BREAST LUMPECTOMY WITH RADIOACTIVE SEED LOCALIZATION;  Surgeon: Jovita Kussmaul, MD;  Location: [place];  Service: General;  Laterality: Left;     OB History   No obstetric history on file.      Home Medications    Prior to Admission medications   Medication Sig Start Date End Date Taking? Authorizing Provider  amLODipine (NORVASC) 10 MG tablet Take 1 tablet (10 mg total) by mouth daily. 09/06/18   Monica Becton, MD  carvedilol (COREG) 25 MG tablet Take 25 mg by mouth 2 (two) times daily with a meal.     [provider]  Febuxostat (ULORIC) 80 MG TABS Take 1 tablet (80 mg total) by mouth daily. Patient taking differently: Take  40 mg by mouth daily.  04/15/12   Darlyne Russian, MD  furosemide (LASIX) 20 MG tablet Take 1 tablet (20 mg total) by mouth daily. 09/06/18 10/06/18  Finnigan, Izora Gala A, DO  furosemide (LASIX) 40 MG tablet Take 1 tablet (40 mg total) by mouth daily. 09/06/18 09/06/19  Monica Becton, MD  hydrALAZINE (APRESOLINE) 100 MG tablet Take 1 tablet (100 mg total) by mouth 3 (three) times daily. 09/06/18   Monica Becton, MD  HYDROcodone-acetaminophen (NORCO/VICODIN) 5-325 MG tablet Take 1-2 tablets by mouth every 6 (six) hours as needed for moderate pain or severe pain.  Patient not taking: Reported on 08/30/2018 10/09/17   Autumn Messing III, MD  LORazepam (ATIVAN) 1 MG tablet Take 1 tablet (1 mg total) by mouth at bedtime. 09/06/18   Monica Becton, MD  Multiple Vitamin (MULITIVITAMIN WITH MINERALS) TABS Take 1 tablet by mouth daily.    [provider]  sertraline (ZOLOFT) 100 MG tablet Take 1.5 tablets (150 mg total) by mouth daily. PATIENT NEEDS OFFICE VISIT FOR ADDITIONAL REFILLS Patient taking differently: Take 150 mg by mouth daily.  02/01/13   Theda Sers, PA-C  sevelamer carbonate (RENVELA) 800 MG tablet Take 1 tablet (800 mg total) by mouth 3 (three) times daily with meals. 09/06/18   Monica Becton, MD    Family History Family History  Problem Relation Age of Onset  . Heart failure Sister   . Diabetes Sister   . Hypertension Sister   . Kidney disease Sister   . Hypertension Sister   . Diabetes Sister   . Colon polyps Sister   . Hypertension Mother   . Hypertension Sister   . Cancer Father        lung cancer; smoker and asbestos exposure  . Diabetes Father   . Cancer Maternal Aunt        ovarian  . Cancer Maternal Grandmother        breast  . Colon cancer Maternal Grandmother 39  . Diabetes type II Sister     Social History Social History   Tobacco Use  . Smoking status: Never Smoker  . Smokeless tobacco: Never Used  Substance Use Topics  . Alcohol use: No  . Drug use: No     Allergies   Sulfa antibiotics, Tekturna [aliskiren], Lisinopril, Clonidine derivatives, Diovan [valsartan], Hctz [hydrochlorothiazide], Norvasc [amlodipine besylate], and Procardia [nifedipine]   Review of Systems Review of Systems  All other systems negative except as documented in the HPI. All pertinent positives and negatives as reviewed in the HPI.  Physical Exam Updated Vital Signs BP (!) 173/94   Pulse 78   Temp 97.7 F (36.5 C) (Oral)   Resp (!) 21   Ht 5\' 7"  (1.702 m)   Wt 86.6 kg   SpO2 100%   BMI 29.91 kg/m    Physical Exam Vitals signs and nursing note reviewed.  Constitutional:      General: She is not in acute distress.    Appearance: She is well-developed.  HENT:     Head: Normocephalic and atraumatic.  Eyes:     Pupils: Pupils are equal, round, and reactive to light.  Neck:     Musculoskeletal: Normal range of motion and neck supple.  Cardiovascular:     Rate and Rhythm: Normal rate and regular rhythm.     Heart sounds: Normal heart sounds. No murmur. No friction rub. No gallop.   Pulmonary:     Effort: Pulmonary effort is normal. No respiratory distress.  Breath sounds: Normal breath sounds. No decreased breath sounds, wheezing, rhonchi or rales.  Abdominal:     General: Bowel sounds are normal. There is no distension.     Palpations: Abdomen is soft.     Tenderness: There is no abdominal tenderness.  Skin:    General: Skin is warm and dry.     Capillary Refill: Capillary refill takes less than 2 seconds.     Findings: No erythema or rash.  Neurological:     Mental Status: She is alert and oriented to person, place, and time.     Motor: No abnormal muscle tone.     Coordination: Coordination normal.  Psychiatric:        Behavior: Behavior normal.      ED Treatments / Results  Labs (all labs ordered are listed, but only abnormal results are displayed) Labs Reviewed  BASIC METABOLIC PANEL - Abnormal; Notable for the following components:      Result Value   CO2 13 (*)    Glucose, Bld 174 (*)    BUN 71 (*)    Creatinine, Ser 7.17 (*)    GFR calc non Af Amer 6 (*)    GFR calc Af Amer 7 (*)    Anion gap 18 (*)    All other components within normal limits  CBC - Abnormal; Notable for the following components:   RBC 3.59 (*)    Hemoglobin 9.0 (*)    HCT 30.6 (*)    MCH 25.1 (*)    MCHC 29.4 (*)    RDW 16.5 (*)    All other components within normal limits  BRAIN NATRIURETIC PEPTIDE - Abnormal; Notable for the following components:   B Natriuretic Peptide 207.8  (*)    All other components within normal limits  URINALYSIS, ROUTINE W REFLEX MICROSCOPIC - Abnormal; Notable for the following components:   APPearance HAZY (*)    Hgb urine dipstick SMALL (*)    Protein, ur 100 (*)    Leukocytes,Ua MODERATE (*)    Bacteria, UA MANY (*)    All other components within normal limits  TROPONIN I (HIGH SENSITIVITY) - Abnormal; Notable for the following components:   Troponin I (High Sensitivity) 22 (*)    All other components within normal limits  TROPONIN I (HIGH SENSITIVITY)    EKG EKG Interpretation  Date/Time:  Monday October 04 2018 20:48:34 EDT Ventricular Rate:  92 PR Interval:  194 QRS Duration: 98 QT Interval:  386 QTC Calculation: 477 R Axis:   -12 Text Interpretation:  Normal sinus rhythm Left ventricular hypertrophy with repolarization abnormality Nonspecific ST abnormality No significant change since last tracing Confirmed by Lajean Saver [phone]) on 10/05/2018 10:19:25 AM   Radiology Dg Chest 2 View  Result Date: 10/04/2018 CLINICAL DATA:  Shortness of breath and chest pain beginning today. EXAM: CHEST - 2 VIEW COMPARISON:  08/30/2018 FINDINGS: Cardiomegaly appears mildly decreased since previous study. Stable tortuosity of thoracic aorta. There has been resolution of pulmonary airspace disease since previous study. Low lung volumes are noted, however both lungs are now clear. No evidence of pneumothorax or pleural effusion. IMPRESSION: Mild cardiomegaly.  No active lung disease. Electronically Signed   By: Marlaine Hind M.D.   On: 10/04/2018 21:18    Procedures Procedures (including critical care time)  Medications Ordered in ED Medications  sodium chloride flush (NS) 0.9 % injection 3 mL (3 mLs Intravenous Not Given 10/05/18 1021)     Initial Impression / Assessment and  Plan / ED Course  I have reviewed the triage vital signs and the nursing notes.  Pertinent labs & imaging results that were available during my care of the  patient were reviewed by me and considered in my medical decision making (see chart for details).      Return here as needed.  He had a urinary tract infection and will need to treat for this.  Patient has been otherwise stable.  She is not having any shortness of breath on my exam.  The patient is not showing any signs of distress at this point.  The patient's vital signs have remained stable other than her high blood pressure.    Final Clinical Impressions(s) / ED Diagnoses   Final diagnoses:  None    ED Discharge Orders    None       Dalia Heading, PA-C 10/05/18 1248    Synthia Fairbank, [place], PA-C 10/05/18 1502    Drenda Freeze, MD 10/06/18 [phone]

## 2018-10-06 ENCOUNTER — Other Ambulatory Visit (HOSPITAL_COMMUNITY): Payer: Self-pay | Admitting: Nephrology

## 2018-10-06 ENCOUNTER — Telehealth (HOSPITAL_COMMUNITY): Payer: Self-pay

## 2018-10-06 DIAGNOSIS — N186 End stage renal disease: Secondary | ICD-10-CM

## 2018-10-06 NOTE — Telephone Encounter (Signed)
Called to schedule TDC placement, no answer, left vm. AW

## 2018-10-11 ENCOUNTER — Other Ambulatory Visit: Payer: Self-pay | Admitting: Student

## 2018-10-11 ENCOUNTER — Other Ambulatory Visit: Payer: Self-pay

## 2018-10-11 DIAGNOSIS — Z992 Dependence on renal dialysis: Secondary | ICD-10-CM

## 2018-10-11 DIAGNOSIS — N186 End stage renal disease: Secondary | ICD-10-CM

## 2018-10-12 ENCOUNTER — Ambulatory Visit (HOSPITAL_COMMUNITY): Payer: Self-pay

## 2018-10-12 ENCOUNTER — Ambulatory Visit (HOSPITAL_COMMUNITY)
Admission: RE | Admit: 2018-10-12 | Discharge: 2018-10-12 | Disposition: A | Discharge status: Home / Self Care | Payer: Medicaid Other | Source: Ambulatory Visit | Attending: Nephrology | Admitting: Nephrology

## 2018-10-12 ENCOUNTER — Encounter (HOSPITAL_COMMUNITY): Payer: Self-pay

## 2018-10-12 ENCOUNTER — Other Ambulatory Visit (HOSPITAL_COMMUNITY): Payer: Self-pay | Admitting: Nephrology

## 2018-10-12 ENCOUNTER — Other Ambulatory Visit: Payer: Self-pay

## 2018-10-12 DIAGNOSIS — Z79899 Other long term (current) drug therapy: Secondary | ICD-10-CM | POA: Insufficient documentation

## 2018-10-12 DIAGNOSIS — F419 Anxiety disorder, unspecified: Secondary | ICD-10-CM | POA: Diagnosis not present

## 2018-10-12 DIAGNOSIS — I120 Hypertensive chronic kidney disease with stage 5 chronic kidney disease or end stage renal disease: Secondary | ICD-10-CM | POA: Diagnosis present

## 2018-10-12 DIAGNOSIS — E785 Hyperlipidemia, unspecified: Secondary | ICD-10-CM | POA: Diagnosis not present

## 2018-10-12 DIAGNOSIS — N186 End stage renal disease: Secondary | ICD-10-CM | POA: Insufficient documentation

## 2018-10-12 DIAGNOSIS — M109 Gout, unspecified: Secondary | ICD-10-CM | POA: Insufficient documentation

## 2018-10-12 DIAGNOSIS — G4730 Sleep apnea, unspecified: Secondary | ICD-10-CM | POA: Insufficient documentation

## 2018-10-12 DIAGNOSIS — R7303 Prediabetes: Secondary | ICD-10-CM | POA: Insufficient documentation

## 2018-10-12 DIAGNOSIS — F329 Major depressive disorder, single episode, unspecified: Secondary | ICD-10-CM | POA: Insufficient documentation

## 2018-10-12 HISTORY — PX: IR US GUIDE VASC ACCESS RIGHT: IMG2390

## 2018-10-12 HISTORY — PX: IR FLUORO GUIDE CV LINE RIGHT: IMG2283

## 2018-10-12 LAB — PROTIME-INR
INR: 1.3 — ABNORMAL HIGH (ref 0.8–1.2)
Prothrombin Time: 15.8 seconds — ABNORMAL HIGH (ref 11.4–15.2)

## 2018-10-12 LAB — CBC
HCT: 30.9 % — ABNORMAL LOW (ref 36.0–46.0)
Hemoglobin: 9.5 g/dL — ABNORMAL LOW (ref 12.0–15.0)
MCH: 25.1 pg — ABNORMAL LOW (ref 26.0–34.0)
MCHC: 30.7 g/dL (ref 30.0–36.0)
MCV: 81.5 fL (ref 80.0–100.0)
Platelets: 217 10*3/uL (ref 150–400)
RBC: 3.79 MIL/uL — ABNORMAL LOW (ref 3.87–5.11)
RDW: 16.9 % — ABNORMAL HIGH (ref 11.5–15.5)
WBC: 8.6 10*3/uL (ref 4.0–10.5)
nRBC: 0.2 % (ref 0.0–0.2)

## 2018-10-12 MED ORDER — FENTANYL CITRATE (PF) 100 MCG/2ML IJ SOLN
INTRAMUSCULAR | Status: AC | PRN
  Administered 2018-10-12: 50 ug via INTRAVENOUS

## 2018-10-12 MED ORDER — CEFAZOLIN SODIUM-DEXTROSE 2-4 GM/100ML-% IV SOLN
INTRAVENOUS | Status: AC
  Filled 2018-10-12: qty 100

## 2018-10-12 MED ORDER — SODIUM CHLORIDE 0.9 % IV SOLN: INTRAVENOUS | Status: DC

## 2018-10-12 MED ORDER — IOHEXOL 300 MG/ML  SOLN
50.0000 mL | Freq: Once | INTRAMUSCULAR | Status: AC | PRN
  Administered 2018-10-12: 11:00:00 10 mL via INTRAVENOUS

## 2018-10-12 MED ORDER — LIDOCAINE HCL 1 % IJ SOLN
INTRAMUSCULAR | Status: AC
  Filled 2018-10-12: qty 20

## 2018-10-12 MED ORDER — HEPARIN SODIUM (PORCINE) 1000 UNIT/ML IJ SOLN
INTRAMUSCULAR | Status: AC
  Filled 2018-10-12: qty 1

## 2018-10-12 MED ORDER — CEFAZOLIN SODIUM-DEXTROSE 2-4 GM/100ML-% IV SOLN
2.0000 g | Freq: Once | INTRAVENOUS | Status: AC
  Administered 2018-10-12: 2 g via INTRAVENOUS

## 2018-10-12 MED ORDER — MIDAZOLAM HCL 2 MG/2ML IJ SOLN
INTRAMUSCULAR | Status: AC
  Filled 2018-10-12: qty 2

## 2018-10-12 MED ORDER — LIDOCAINE HCL 1 % IJ SOLN
INTRAMUSCULAR | Status: AC | PRN
  Administered 2018-10-12: 8 mL

## 2018-10-12 MED ORDER — MIDAZOLAM HCL 2 MG/2ML IJ SOLN
INTRAMUSCULAR | Status: AC | PRN
  Administered 2018-10-12: 1 mg via INTRAVENOUS

## 2018-10-12 MED ORDER — FENTANYL CITRATE (PF) 100 MCG/2ML IJ SOLN
INTRAMUSCULAR | Status: AC
  Filled 2018-10-12: qty 2

## 2018-10-12 MED ORDER — HEPARIN SODIUM (PORCINE) 1000 UNIT/ML IJ SOLN
INTRAMUSCULAR | Status: AC | PRN
  Administered 2018-10-12: 3.2 mL via INTRAVENOUS

## 2018-10-12 NOTE — Discharge Instructions (Signed)
Central Line Dialysis Access Placement, Care After This sheet gives you information about how to care for yourself after your procedure. Your health care provider may also give you more specific instructions. If you have problems or questions, contact your health care provider. What can I expect after the procedure? After the procedure, it is common to have:  Mild pain or discomfort.  Mild redness, swelling, or bruising around your incision.  A small amount of blood or clear fluid coming from your incision. Follow these instructions at home: Incision care   Follow instructions from your health care provider about how to take care of your incision. Make sure you: ? Wash your hands with soap and water before you change your bandage (dressing). If soap and water are not available, use hand sanitizer. ? Change your dressing as told by your health care provider. ? Leave stitches (sutures) in place.  Check your incision area every day for signs of infection. Check for: ? More redness, swelling, or pain. ? More fluid or blood. ? Warmth. ? Pus or a bad smell.  If directed, put heat on the catheter site as often as told by your health care provider. Use the heat source that your health care provider recommends, such as a moist heat pack or a heating pad. ? Place a towel between your skin and the heat source. ? Leave the heat on for 20-30 minutes. ? Remove the heat if your skin turns bright red. This is especially important if you are unable to feel pain, heat, or cold. You may have a greater risk of getting burned.  If directed, put ice on the catheter site: ? Put ice in a plastic bag. ? Place a towel between your skin and the bag. ? Leave the ice on for 20 minutes, 2-3 times a day. Medicines  Take over-the-counter and prescription medicines only as told by your health care provider.  If you were prescribed an antibiotic medicine, use it as told by your health care provider. Do not stop  using the antibiotic even if you start to feel better. Activity  Return to your normal activities as told by your health care provider. Ask your health care provider what activities are safe for you.  Do not lift anything that is heavier than 10 lb (4.5 kg) until your health care provider says that this is safe. Driving  Do not drive for 24 hours if you were given a medicine to help you relax (sedative) during your procedure.  Do not drive or use heavy machinery while taking prescription pain medicine. Lifestyle  Limit alcohol intake to no more than 1 drink a day for nonpregnant women and 2 drinks a day for men. One drink equals 12 oz of beer, 5 oz of wine, or 1 oz of hard liquor.  Do not use any products that contain nicotine or tobacco, such as cigarettes and e-cigarettes. If you need help quitting, ask your health care provider. General instructions  Do not take baths or showers, swim, or use a hot tub until your health care provider approves. You may only be allowed to take sponge baths for bathing.  Wear compression stockings as told by your health care provider. These stockings help to prevent blood clots and reduce swelling in your legs.  Follow instructions from your health care provider about eating or drinking restrictions.  Keep all follow-up visits as told by your health care provider. This is important. Contact a health care provider if:  Your   catheter gets pulled out of place.  Your catheter site becomes itchy.  You develop a rash around your catheter site.  You have more redness, swelling, or pain around your incision.  You have more fluid or blood coming from your incision.  Your incision area feels warm to the touch.  You have pus or a bad smell coming from your incision.  You have a fever. Get help right away if:  You become light-headed or dizzy.  You faint.  You have difficulty breathing.  Your catheter gets pulled out completely. This  information is not intended to replace advice given to you by your health care provider. Make sure you discuss any questions you have with your health care provider. Document Released: 08/21/2003 Document Revised: 12/19/2016 Document Reviewed: 10/01/2015 Elsevier Patient Education  [place].   Moderate Conscious Sedation, Adult, Care After These instructions provide you with information about caring for yourself after your procedure. Your health care provider may also give you more specific instructions. Your treatment has been planned according to current medical practices, but problems sometimes occur. Call your health care provider if you have any problems or questions after your procedure. What can I expect after the procedure? After your procedure, it is common:  To feel sleepy for several hours.  To feel clumsy and have poor balance for several hours.  To have poor judgment for several hours.  To vomit if you eat too soon. Follow these instructions at home: For at least 24 hours after the procedure:   Do not: ? Participate in activities where you could fall or become injured. ? Drive. ? Use heavy machinery. ? Drink alcohol. ? Take sleeping pills or medicines that cause drowsiness. ? Make important decisions or sign legal documents. ? Take care of children on your own.  Rest. Eating and drinking  Follow the diet recommended by your health care provider.  If you vomit: ? Drink water, juice, or soup when you can drink without vomiting. ? Make sure you have little or no nausea before eating solid foods. General instructions  Have a responsible adult stay with you until you are awake and alert.  Take over-the-counter and prescription medicines only as told by your health care provider.  If you smoke, do not smoke without supervision.  Keep all follow-up visits as told by your health care provider. This is important. Contact a health care provider if:  You  keep feeling nauseous or you keep vomiting.  You feel light-headed.  You develop a rash.  You have a fever. Get help right away if:  You have trouble breathing. This information is not intended to replace advice given to you by your health care provider. Make sure you discuss any questions you have with your health care provider. Document Released: 10/27/2012 Document Revised: 12/19/2016 Document Reviewed: 04/28/2015 Elsevier Patient Education  2020 [employer].

## 2018-10-12 NOTE — Procedures (Signed)
Pre-procedure Diagnosis: ESRD Post-procedure Diagnosis: Same  Successful placement of tunneled HD catheter with tips terminating within the superior aspect of the right atrium.    Complications: None Immediate  EBL: Minimal   The catheter is ready for immediate use.   Jay Izaah Westman, MD Pager #: [phone]   

## 2018-10-12 NOTE — Progress Notes (Signed)
Instructed patient on use of blue clamps and sent home with patient.

## 2018-10-12 NOTE — Progress Notes (Signed)
Discharge instructions reviewed with Toney Rakes, sister and patient. Verbalized understanding.

## 2018-10-12 NOTE — H&P (Signed)
Chief Complaint: Patient was seen in consultation today for tunneled dialysis catheter placement at the request of [place] B  Referring Physician(s): [place] B  Supervising Physician: Sandi Mariscal  Patient Status: [facility] - Out-pt  History of Present Illness: Chelsea Roberts is a 63 y.o. female   ESRD Left arm AVF placed 08/2018 Not yet mature  Dr Joelyn Oms feels need to initiate dialysis now Uremic SOB  Scheduled for tunneled dialysis catheter placement  Awaiting maturation of AVF   Past Medical History:  Diagnosis Date  . Anxiety   . Breast mass   . Chronic kidney disease   . Complication of anesthesia    severe htn-had to take 2 ativan preop  . Depression   . Gout   . Hyperlipidemia   . Hypertension   . Pre-diabetes   . Sleep apnea    NO CPAP use per pt., lost 70lbs  . Systolic CHF, chronic ([place]) 2013    Past Surgical History:  Procedure Laterality Date  . ABDOMINAL HYSTERECTOMY  1999   partial  . AV FISTULA PLACEMENT Left 09/01/2018   Procedure: CREATION OF ARTERIOVENOUS (AV) FISTULA LEFT ARM;  Surgeon: [name], MD;  Location: [place];  Service: Vascular;  Laterality: Left;  . BREAST BIOPSY  01/01/2012   Procedure: BREAST BIOPSY WITH NEEDLE LOCALIZATION;  Surgeon: Haywood Lasso, MD;  Location: [place];  Service: General;  Laterality: Right;  Needle localization excision Right breast mass  . BREAST CYST EXCISION  12/2011  . BREAST LUMPECTOMY WITH RADIOACTIVE SEED LOCALIZATION Left 10/09/2017   Procedure: BREAST LUMPECTOMY WITH RADIOACTIVE SEED LOCALIZATION;  Surgeon: Jovita Kussmaul, MD;  Location: [place];  Service: General;  Laterality: Left;    Allergies: Sulfa antibiotics, Tekturna [aliskiren], Lisinopril, Clonidine derivatives, Diovan [valsartan], Hctz [hydrochlorothiazide], Norvasc [amlodipine besylate], and Procardia [nifedipine]  Medications: Prior to Admission medications   Medication Sig  Start Date End Date Taking? Authorizing Provider  amLODipine (NORVASC) 10 MG tablet Take 1 tablet (10 mg total) by mouth daily. 09/06/18  Yes Vasireddy, Grier Mitts, MD  carvedilol (COREG) 25 MG tablet Take 25 mg by mouth 2 (two) times daily with a meal.    Yes [provider]  cephALEXin (KEFLEX) 500 MG capsule Take 2 capsules (1,000 mg total) by mouth 2 (two) times daily. 10/05/18  Yes Lawyer, Harrell Gave, PA-C  doxazosin (CARDURA) 4 MG tablet Take 4 mg by mouth daily.   Yes [provider]  hydrALAZINE (APRESOLINE) 100 MG tablet Take 1 tablet (100 mg total) by mouth 3 (three) times daily. 09/06/18  Yes Vasireddy, Grier Mitts, MD  LORazepam (ATIVAN) 1 MG tablet Take 1 tablet (1 mg total) by mouth at bedtime. Patient taking differently: Take 1 mg by mouth daily as needed.  09/06/18  Yes Vasireddy, Grier Mitts, MD  Multiple Vitamin (MULITIVITAMIN WITH MINERALS) TABS Take 1 tablet by mouth daily.   Yes [provider]  sertraline (ZOLOFT) 100 MG tablet Take 1.5 tablets (150 mg total) by mouth daily. PATIENT NEEDS OFFICE VISIT FOR ADDITIONAL REFILLS Patient taking differently: Take 150 mg by mouth daily.  02/01/13  Yes Bailey Mech E, PA-C  sevelamer carbonate (RENVELA) 800 MG tablet Take 1 tablet (800 mg total) by mouth 3 (three) times daily with meals. 09/06/18  Yes Vasireddy, Grier Mitts, MD  Febuxostat (ULORIC) 80 MG TABS Take 1 tablet (80 mg total) by mouth daily. Patient not taking: Reported on 10/07/2018 04/15/12   Darlyne Russian, MD     Family  History  Problem Relation Age of Onset  . Heart failure Sister   . Diabetes Sister   . Hypertension Sister   . Kidney disease Sister   . Hypertension Sister   . Diabetes Sister   . Colon polyps Sister   . Hypertension Mother   . Hypertension Sister   . Cancer Father        lung cancer; smoker and asbestos exposure  . Diabetes Father   . Cancer Maternal Aunt        ovarian  . Cancer Maternal Grandmother        breast  . Colon cancer  Maternal Grandmother 74  . Diabetes type II Sister     Social History   Socioeconomic History  . Marital status: Married    Spouse name: n/a  . Number of children: 0  . Years of education: Not on file  . Highest education level: Not on file  Occupational History  . Occupation: Animator: Therapist, nutritional  . Occupation: Scientist, water quality: [employer]  Social Needs  . Financial resource strain: Not on file  . Food insecurity    Worry: Not on file    Inability: Not on file  . Transportation needs    Medical: Not on file    Non-medical: Not on file  Tobacco Use  . Smoking status: Never Smoker  . Smokeless tobacco: Never Used  Substance and Sexual Activity  . Alcohol use: No  . Drug use: No  . Sexual activity: Not on file  Lifestyle  . Physical activity    Days per week: Not on file    Minutes per session: Not on file  . Stress: Not on file  Relationships  . Social Herbalist on phone: Not on file    Gets together: Not on file    Attends religious service: Not on file    Active member of club or organization: Not on file    Attends meetings of clubs or organizations: Not on file    Relationship status: Not on file  Other Topics Concern  . Not on file  Social History Narrative   Marital:  Divorced; dating.     Children: none     Lives in [place], [place] alone.      Employment:  [employer] [place].      Tobacco: none     Review of Systems: A 12 point ROS discussed and pertinent positives are indicated in the HPI above.  All other systems are negative.  Review of Systems  Constitutional: Positive for fatigue.  Respiratory: Positive for shortness of breath. Negative for cough.   Cardiovascular: Negative for chest pain.  Gastrointestinal: Positive for nausea.  Neurological: Positive for weakness.  Psychiatric/Behavioral: Negative for behavioral problems and confusion.    Vital Signs: BP (!) 159/80 (BP  Location: Right Arm)   Pulse 88   Temp (!) 97.5 F (36.4 C) (Skin)   Resp 16   Ht 5\' 7"  (1.702 m)   Wt 188 lb (85.3 kg)   SpO2 100%   BMI 29.44 kg/m   Physical Exam Vitals signs reviewed.  Cardiovascular:     Rate and Rhythm: Normal rate and regular rhythm.     Heart sounds: Normal heart sounds.  Pulmonary:     Breath sounds: Normal breath sounds.  Abdominal:     Palpations: Abdomen is soft.  Musculoskeletal: Normal range of motion.  Comments: Left arm AVF: +pulses/thrill  Skin:    General: Skin is warm and dry.  Neurological:     Mental Status: She is alert and oriented to person, place, and time.  Psychiatric:        Mood and Affect: Mood normal.        Behavior: Behavior normal.        Thought Content: Thought content normal.        Judgment: Judgment normal.     Imaging: Dg Chest 2 View  Result Date: 10/04/2018 CLINICAL DATA:  Shortness of breath and chest pain beginning today. EXAM: CHEST - 2 VIEW COMPARISON:  08/30/2018 FINDINGS: Cardiomegaly appears mildly decreased since previous study. Stable tortuosity of thoracic aorta. There has been resolution of pulmonary airspace disease since previous study. Low lung volumes are noted, however both lungs are now clear. No evidence of pneumothorax or pleural effusion. IMPRESSION: Mild cardiomegaly.  No active lung disease. Electronically Signed   By: Marlaine Hind M.D.   On: 10/04/2018 21:18    Labs:  CBC: Recent Labs    08/30/18 0949 08/31/18 0229 10/04/18 2044 10/12/18 0807  WBC 7.7 6.5 6.8 8.6  HGB 7.7* 8.7* 9.0* 9.5*  HCT 25.7* 28.2* 30.6* 30.9*  PLT 213 289 208 217    COAGS: Recent Labs    10/12/18 0807  INR 1.3*    BMP: Recent Labs    09/04/18 0317 09/05/18 0327 09/06/18 0429 10/04/18 2044  NA 142 140 140 136  K 3.9 3.6 3.6 4.1  CL 106 105 108 105  CO2 22 21* 20* 13*  GLUCOSE 110* 92 95 174*  BUN 86* 86* 84* 71*  CALCIUM 8.9 8.8* 8.9 10.0  CREATININE 7.72* 7.58* 7.64* 7.17*  GFRNONAA  5* 5* 5* 6*  GFRAA 6* 6* 6* 7*    LIVER FUNCTION TESTS: Recent Labs    09/03/18 0356 09/04/18 0317 09/05/18 0327 09/06/18 0429  ALBUMIN 2.8* 2.9* 2.7* 2.9*    TUMOR MARKERS: No results for input(s): AFPTM, CEA, CA199, CHROMGRNA in the last 8760 hours.  Assessment and Plan:  ESRD Left arm AVF placed 09/01/18-- not yet mature Uremic; SOB Fatigue Scheduled to initiate dialysis asap per Dr Joelyn Oms Need tunneled dialysis catheter now Risks and benefits discussed with the patient including, but not limited to bleeding, infection, vascular injury, pneumothorax which may require chest tube placement, air embolism or even death  All of the patient's questions were answered, patient is agreeable to proceed. Consent signed and in chart.   Thank you for this interesting consult.  I greatly enjoyed meeting Chelsea Roberts and look forward to participating in their care.  A copy of this report was sent to the requesting provider on this date.  Electronically Signed: Lavonia Drafts, PA-C 10/12/2018, 9:04 AM   I spent a total of  30 Minutes   in face to face in clinical consultation, greater than 50% of which was counseling/coordinating care for tunneled dialysis catheter placement

## 2018-12-08 ENCOUNTER — Other Ambulatory Visit: Payer: Self-pay

## 2018-12-08 DIAGNOSIS — Z992 Dependence on renal dialysis: Secondary | ICD-10-CM

## 2018-12-08 DIAGNOSIS — N186 End stage renal disease: Secondary | ICD-10-CM

## 2018-12-09 ENCOUNTER — Other Ambulatory Visit: Payer: Self-pay | Admitting: *Deleted

## 2018-12-09 ENCOUNTER — Ambulatory Visit (INDEPENDENT_AMBULATORY_CARE_PROVIDER_SITE_OTHER): Payer: Medicaid Other | Admitting: Physician Assistant

## 2018-12-09 ENCOUNTER — Other Ambulatory Visit: Payer: Self-pay

## 2018-12-09 ENCOUNTER — Ambulatory Visit (HOSPITAL_COMMUNITY)
Admission: RE | Admit: 2018-12-09 | Discharge: 2018-12-09 | Disposition: A | Discharge status: Home / Self Care | Payer: Medicaid Other | Source: Ambulatory Visit | Attending: Family | Admitting: Family

## 2018-12-09 ENCOUNTER — Encounter: Payer: Self-pay | Admitting: *Deleted

## 2018-12-09 DIAGNOSIS — N186 End stage renal disease: Secondary | ICD-10-CM | POA: Insufficient documentation

## 2018-12-09 DIAGNOSIS — Z992 Dependence on renal dialysis: Secondary | ICD-10-CM

## 2018-12-09 NOTE — Progress Notes (Signed)
Established Dialysis Access   History of Present Illness   Chelsea Roberts is a 63 y.o. (Jan 22, 1955) female who presents for re-evaluation of permanent access.  She is status post left brachiocephalic fistula creation by Dr. Donnetta Hutching in August of this year.  She was since started on hemodialysis in September via right IJ tunneled dialysis catheter.  She denies signs or symptoms of steal syndrome in left hand.  She was referred back to our office by Dr. Freddrick March to evaluate readiness of left arm fistula for use during hemodialysis.  She is dialyzing on a Monday Wednesday Friday schedule.  The patient's PMH, PSH, SH, and FamHx were reviewed and are unchanged from prior visit.  Current Outpatient Medications  Medication Sig Dispense Refill  . amLODipine (NORVASC) 10 MG tablet Take 1 tablet (10 mg total) by mouth daily. 30 tablet 2  . b complex-vitamin c-folic acid (NEPHRO-VITE) 0.8 MG TABS tablet Take 1 tablet by mouth at bedtime.    . carvedilol (COREG) 25 MG tablet Take 25 mg by mouth daily.     . Febuxostat (ULORIC) 80 MG TABS Take 1 tablet (80 mg total) by mouth daily. 30 tablet 5  . hydrALAZINE (APRESOLINE) 100 MG tablet Take 1 tablet (100 mg total) by mouth 3 (three) times daily. 90 tablet 2  . LORazepam (ATIVAN) 1 MG tablet Take 1 tablet (1 mg total) by mouth at bedtime. (Patient taking differently: Take 1 mg by mouth daily as needed. ) 30 tablet 0  . predniSONE (DELTASONE) 20 MG tablet Take 20 mg by mouth as needed.    . sertraline (ZOLOFT) 100 MG tablet Take 1.5 tablets (150 mg total) by mouth daily. PATIENT NEEDS OFFICE VISIT FOR ADDITIONAL REFILLS (Patient taking differently: Take 150 mg by mouth daily. ) 45 tablet 0  . sevelamer carbonate (RENVELA) 800 MG tablet Take 1 tablet (800 mg total) by mouth 3 (three) times daily with meals. 90 tablet 0   No current facility-administered medications for this visit.     On ROS today: 10 system ROS is negative unless otherwise noted in HPI    Physical Examination   Vitals:   12/09/18 1516  BP: (!) 147/78  Pulse: 75  Resp: 12  Temp: 97.9 F (36.6 C)  TempSrc: Temporal  SpO2: 97%  Weight: 188 lb 3.2 oz (85.4 kg)  Height: 5\' 7"  (1.702 m)   Body mass index is 29.48 kg/m.  General Alert, O x 3, WD, NAD  Pulmonary Sym exp, good B air movt, CTA B  Cardiac RRR, Nl S1, S2,  Vascular Vessel Right Left  Radial Palpable Palpable  Brachial Palpable Palpable  Ulnar Not palpable Not palpable    Musculo- skeletal  palpable left radial pulse; easily palpable thrill fistula near anastomosis and distal upper arm; thrill more difficult to feel in mid to upper arm past area of narrowing  Neurologic A&O; CN grossly intact     Non-invasive Vascular Imaging   Patent left brachiocephalic fistula Stenotic area in distal upper arm with low flow volume beyond narrowing    Medical Decision Making   Chelsea Roberts is a 63 y.o. female who presents with ESRD requiring hemodialysis.    Based on fistula duplex diameter of fistula is adequate however there is a stenotic area in distal upper arm with low flow volumes beyond this area  On physical exam there is a palpable change in quality of thrill from distal upper arm to fistula beyond area of stenosis  Plan will be for left arm fistulogram plus or minus intervention next available  Continue HD via right IJ [facility] for now Risk, benefits, and alternatives to access surgery were discussed.   The patient is aware the risks include but are not limited to: bleeding, infection, thrombosis, failure to mature, and need for additional procedures.   The patient agrees to proceed with the procedure.   Dagoberto Ligas PA-C [facility] Office: [phone]  Clinic MD: Dr. Oneida Alar

## 2018-12-11 ENCOUNTER — Other Ambulatory Visit (HOSPITAL_COMMUNITY)
Admission: RE | Admit: 2018-12-11 | Discharge: 2018-12-11 | Disposition: A | Discharge status: Home / Self Care | Payer: Medicaid Other | Source: Ambulatory Visit | Attending: Vascular Surgery | Admitting: Vascular Surgery

## 2018-12-11 DIAGNOSIS — Z20828 Contact with and (suspected) exposure to other viral communicable diseases: Secondary | ICD-10-CM | POA: Diagnosis not present

## 2018-12-11 DIAGNOSIS — Z01812 Encounter for preprocedural laboratory examination: Secondary | ICD-10-CM | POA: Diagnosis not present

## 2018-12-11 LAB — SARS CORONAVIRUS 2 (TAT 6-24 HRS): SARS Coronavirus 2: NEGATIVE

## 2018-12-13 ENCOUNTER — Other Ambulatory Visit: Payer: Self-pay

## 2018-12-13 ENCOUNTER — Other Ambulatory Visit: Payer: Self-pay | Admitting: *Deleted

## 2018-12-13 ENCOUNTER — Ambulatory Visit (HOSPITAL_COMMUNITY)
Admission: RE | Admit: 2018-12-13 | Discharge: 2018-12-13 | Disposition: A | Discharge status: Home / Self Care | Payer: Medicaid Other | Attending: Vascular Surgery | Admitting: Vascular Surgery

## 2018-12-13 ENCOUNTER — Encounter (HOSPITAL_COMMUNITY)
Admission: RE | Disposition: A | Discharge status: Home / Self Care | Payer: Self-pay | Source: Home / Self Care | Attending: Vascular Surgery

## 2018-12-13 DIAGNOSIS — Z79899 Other long term (current) drug therapy: Secondary | ICD-10-CM | POA: Diagnosis not present

## 2018-12-13 DIAGNOSIS — T82898A Other specified complication of vascular prosthetic devices, implants and grafts, initial encounter: Secondary | ICD-10-CM

## 2018-12-13 DIAGNOSIS — Y841 Kidney dialysis as the cause of abnormal reaction of the patient, or of later complication, without mention of misadventure at the time of the procedure: Secondary | ICD-10-CM | POA: Insufficient documentation

## 2018-12-13 DIAGNOSIS — Z992 Dependence on renal dialysis: Secondary | ICD-10-CM | POA: Insufficient documentation

## 2018-12-13 DIAGNOSIS — T82510A Breakdown (mechanical) of surgically created arteriovenous fistula, initial encounter: Secondary | ICD-10-CM | POA: Diagnosis present

## 2018-12-13 DIAGNOSIS — N186 End stage renal disease: Secondary | ICD-10-CM | POA: Insufficient documentation

## 2018-12-13 HISTORY — PX: A/V FISTULAGRAM: CATH118298

## 2018-12-13 LAB — POCT I-STAT, CHEM 8
BUN: 33 mg/dL — ABNORMAL HIGH (ref 8–23)
Calcium, Ion: 1.2 mmol/L (ref 1.15–1.40)
Chloride: 95 mmol/L — ABNORMAL LOW (ref 98–111)
Creatinine, Ser: 6.3 mg/dL — ABNORMAL HIGH (ref 0.44–1.00)
Glucose, Bld: 105 mg/dL — ABNORMAL HIGH (ref 70–99)
HCT: 37 % (ref 36.0–46.0)
Hemoglobin: 12.6 g/dL (ref 12.0–15.0)
Potassium: 3.7 mmol/L (ref 3.5–5.1)
Sodium: 134 mmol/L — ABNORMAL LOW (ref 135–145)
TCO2: 29 mmol/L (ref 22–32)

## 2018-12-13 SURGERY — A/V FISTULAGRAM
Anesthesia: LOCAL | Laterality: Left

## 2018-12-13 MED ORDER — IODIXANOL 320 MG/ML IV SOLN
INTRAVENOUS | Status: DC | PRN
  Administered 2018-12-13: 20 mL via INTRAVENOUS

## 2018-12-13 MED ORDER — SODIUM CHLORIDE 0.9% FLUSH: 3.0000 mL | Freq: Two times a day (BID) | INTRAVENOUS | Status: DC

## 2018-12-13 MED ORDER — HEPARIN (PORCINE) IN NACL 1000-0.9 UT/500ML-% IV SOLN
INTRAVENOUS | Status: DC | PRN
  Administered 2018-12-13: 500 mL

## 2018-12-13 MED ORDER — SODIUM CHLORIDE 0.9 % IV SOLN: 250.0000 mL | INTRAVENOUS | Status: DC | PRN

## 2018-12-13 MED ORDER — LIDOCAINE HCL (PF) 1 % IJ SOLN
INTRAMUSCULAR | Status: AC
  Filled 2018-12-13: qty 30

## 2018-12-13 MED ORDER — SODIUM CHLORIDE 0.9% FLUSH: 3.0000 mL | INTRAVENOUS | Status: DC | PRN

## 2018-12-13 MED ORDER — HEPARIN (PORCINE) IN NACL 1000-0.9 UT/500ML-% IV SOLN
INTRAVENOUS | Status: AC
  Filled 2018-12-13: qty 500

## 2018-12-13 MED ORDER — LIDOCAINE HCL (PF) 1 % IJ SOLN
INTRAMUSCULAR | Status: DC | PRN
  Administered 2018-12-13: 2 mL via INTRADERMAL

## 2018-12-13 SURGICAL SUPPLY — 8 items
COVER DOME SNAP 22 D (MISCELLANEOUS) ×2 IMPLANT
KIT MICROPUNCTURE NIT STIFF (SHEATH) ×1 IMPLANT
PROTECTION STATION PRESSURIZED (MISCELLANEOUS) ×2
SHEATH PROBE COVER 6X72 (BAG) ×1 IMPLANT
STATION PROTECTION PRESSURIZED (MISCELLANEOUS) ×1 IMPLANT
STOPCOCK MORSE 400PSI 3WAY (MISCELLANEOUS) ×2 IMPLANT
TRAY PV CATH (CUSTOM PROCEDURE TRAY) ×2 IMPLANT
TUBING CIL FLEX 10 FLL-RA (TUBING) ×2 IMPLANT

## 2018-12-13 NOTE — Op Note (Signed)
    Patient name: Chelsea Roberts MRN: [id number] DOB: August 02, 1955 Sex: female  12/13/2018 Pre-operative Diagnosis: esrd Post-operative diagnosis:  Same Surgeon:  Erlene Quan C. Donzetta Matters, MD Procedure Performed: 1.  US guided cannulation of left arm avf 2.  Left upper extremity fistulogram  Indications: 63 year old female with end-stage renal disease.  Currently on dialysis via right IJ tunneled catheter.  She has left arm AV fistula that is failed to mature.  Findings: There is a tight stenosis just after the anastomosis.  Fistula is also somewhat deep by ultrasound evaluation in the upper arm.  She also has some side branches.  We will plan for superficial cessation with revision of the stenosis just past the previous arteriovenous anastomosis.   Procedure:  The patient was identified in the holding area and taken to room 8.  The patient was then placed supine on the table and prepped and draped in the usual sterile fashion.  A time out was called.  Ultrasound was used to evaluate the left arm AV fistula.  This was cannulated with direct ultrasound visualization with micropuncture needle followed by wire and sheath.  And images saved the permanent record.  We performed fistulogram of left upper extremity as well as retrograde images with compression.  With the above findings we will plan to revise this fistula in the operating room on a nondialysis day in the near future.  She tolerated procedure well immediate complication.  Contrast: 20cc   Britian Jentz C. Donzetta Matters, MD [facility] Office: [phone] Pager: [phone]

## 2018-12-13 NOTE — H&P (Signed)
   History and Physical Update  The patient was interviewed and re-examined.  The patient's previous History and Physical has been reviewed and is unchanged from recent office visit. Plan for left arm fistulogram today.   Chelsea Roberts C. Donzetta Matters, MD [facility] [place] Office: [phone] Pager: [phone]  12/13/2018, 11:01 AM

## 2018-12-13 NOTE — Discharge Instructions (Signed)

## 2018-12-13 NOTE — Progress Notes (Signed)
Discharge instructions reviewed with patient. Verbalized understanding. Waiting for family member to call back.

## 2018-12-14 ENCOUNTER — Other Ambulatory Visit: Payer: Self-pay

## 2018-12-14 ENCOUNTER — Encounter (HOSPITAL_COMMUNITY): Payer: Self-pay | Admitting: Vascular Surgery

## 2018-12-15 ENCOUNTER — Ambulatory Visit (HOSPITAL_COMMUNITY): Payer: Medicaid Other | Admitting: Certified Registered Nurse Anesthetist

## 2018-12-15 ENCOUNTER — Encounter (HOSPITAL_COMMUNITY)
Admission: RE | Disposition: A | Discharge status: Home / Self Care | Payer: Self-pay | Source: Home / Self Care | Attending: Vascular Surgery

## 2018-12-15 ENCOUNTER — Ambulatory Visit (HOSPITAL_COMMUNITY)
Admission: RE | Admit: 2018-12-15 | Discharge: 2018-12-15 | Disposition: A | Discharge status: Home / Self Care | Payer: Medicaid Other | Attending: Vascular Surgery | Admitting: Vascular Surgery

## 2018-12-15 ENCOUNTER — Encounter (HOSPITAL_COMMUNITY): Payer: Self-pay

## 2018-12-15 DIAGNOSIS — Z79899 Other long term (current) drug therapy: Secondary | ICD-10-CM | POA: Insufficient documentation

## 2018-12-15 DIAGNOSIS — N186 End stage renal disease: Secondary | ICD-10-CM | POA: Insufficient documentation

## 2018-12-15 DIAGNOSIS — T82898A Other specified complication of vascular prosthetic devices, implants and grafts, initial encounter: Secondary | ICD-10-CM

## 2018-12-15 DIAGNOSIS — Z992 Dependence on renal dialysis: Secondary | ICD-10-CM

## 2018-12-15 HISTORY — DX: Unspecified osteoarthritis, unspecified site: M19.90

## 2018-12-15 HISTORY — DX: Type 2 diabetes mellitus without complications: E11.9

## 2018-12-15 HISTORY — DX: Unspecified asthma, uncomplicated: J45.909

## 2018-12-15 HISTORY — PX: FISTULA SUPERFICIALIZATION: SHX6341

## 2018-12-15 LAB — POCT I-STAT, CHEM 8
BUN: 20 mg/dL (ref 8–23)
Calcium, Ion: 1.12 mmol/L — ABNORMAL LOW (ref 1.15–1.40)
Chloride: 96 mmol/L — ABNORMAL LOW (ref 98–111)
Creatinine, Ser: 4.9 mg/dL — ABNORMAL HIGH (ref 0.44–1.00)
Glucose, Bld: 109 mg/dL — ABNORMAL HIGH (ref 70–99)
HCT: 36 % (ref 36.0–46.0)
Hemoglobin: 12.2 g/dL (ref 12.0–15.0)
Potassium: 3.4 mmol/L — ABNORMAL LOW (ref 3.5–5.1)
Sodium: 136 mmol/L (ref 135–145)
TCO2: 28 mmol/L (ref 22–32)

## 2018-12-15 LAB — GLUCOSE, CAPILLARY: Glucose-Capillary: 129 mg/dL — ABNORMAL HIGH (ref 70–99)

## 2018-12-15 SURGERY — FISTULA SUPERFICIALIZATION
Anesthesia: General | Site: Arm Upper | Laterality: Left

## 2018-12-15 MED ORDER — FENTANYL CITRATE (PF) 100 MCG/2ML IJ SOLN
INTRAMUSCULAR | Status: DC | PRN
  Administered 2018-12-15 (×2): 50 ug via INTRAVENOUS
  Administered 2018-12-15 (×2): 25 ug via INTRAVENOUS

## 2018-12-15 MED ORDER — MIDAZOLAM HCL 2 MG/2ML IJ SOLN
INTRAMUSCULAR | Status: AC
  Filled 2018-12-15: qty 2

## 2018-12-15 MED ORDER — DEXAMETHASONE SODIUM PHOSPHATE 10 MG/ML IJ SOLN
INTRAMUSCULAR | Status: DC | PRN
  Administered 2018-12-15: 4 mg via INTRAVENOUS

## 2018-12-15 MED ORDER — LIDOCAINE 2% (20 MG/ML) 5 ML SYRINGE
INTRAMUSCULAR | Status: DC | PRN
  Administered 2018-12-15: 20 mg via INTRAVENOUS
  Administered 2018-12-15: 60 mg via INTRAVENOUS
  Administered 2018-12-15: 40 mg via INTRAVENOUS

## 2018-12-15 MED ORDER — CEFAZOLIN SODIUM-DEXTROSE 2-4 GM/100ML-% IV SOLN
2.0000 g | INTRAVENOUS | Status: AC
  Administered 2018-12-15: 2 g via INTRAVENOUS
  Filled 2018-12-15: qty 100

## 2018-12-15 MED ORDER — 0.9 % SODIUM CHLORIDE (POUR BTL) OPTIME
TOPICAL | Status: DC | PRN
  Administered 2018-12-15: 1000 mL

## 2018-12-15 MED ORDER — SODIUM CHLORIDE 0.9 % IV SOLN
INTRAVENOUS | Status: DC
  Administered 2018-12-15: 08:00:00 via INTRAVENOUS

## 2018-12-15 MED ORDER — ONDANSETRON HCL 4 MG/2ML IJ SOLN
INTRAMUSCULAR | Status: AC
  Filled 2018-12-15: qty 2

## 2018-12-15 MED ORDER — PROPOFOL 10 MG/ML IV BOLUS
INTRAVENOUS | Status: DC | PRN
  Administered 2018-12-15: 60 mg via INTRAVENOUS
  Administered 2018-12-15: 200 mg via INTRAVENOUS

## 2018-12-15 MED ORDER — FENTANYL CITRATE (PF) 250 MCG/5ML IJ SOLN
INTRAMUSCULAR | Status: AC
  Filled 2018-12-15: qty 5

## 2018-12-15 MED ORDER — SUCCINYLCHOLINE CHLORIDE 200 MG/10ML IV SOSY
PREFILLED_SYRINGE | INTRAVENOUS | Status: DC | PRN
  Administered 2018-12-15: 140 mg via INTRAVENOUS

## 2018-12-15 MED ORDER — SODIUM CHLORIDE 0.9 % IV SOLN
INTRAVENOUS | Status: AC
  Filled 2018-12-15: qty 1.2

## 2018-12-15 MED ORDER — OXYCODONE-ACETAMINOPHEN 5-325 MG PO TABS
1.0000 | ORAL_TABLET | Freq: Four times a day (QID) | ORAL | Status: DC | PRN
  Administered 2018-12-15: 1 via ORAL

## 2018-12-15 MED ORDER — HYDROCODONE-ACETAMINOPHEN 5-325 MG PO TABS: 1.0000 | ORAL_TABLET | Freq: Four times a day (QID) | ORAL | 0 refills | Status: DC | PRN

## 2018-12-15 MED ORDER — LIDOCAINE 2% (20 MG/ML) 5 ML SYRINGE
INTRAMUSCULAR | Status: AC
  Filled 2018-12-15: qty 5

## 2018-12-15 MED ORDER — PROPOFOL 10 MG/ML IV BOLUS
INTRAVENOUS | Status: AC
  Filled 2018-12-15: qty 20

## 2018-12-15 MED ORDER — PHENYLEPHRINE HCL-NACL 10-0.9 MG/250ML-% IV SOLN
INTRAVENOUS | Status: DC | PRN
  Administered 2018-12-15: 40 ug/min via INTRAVENOUS

## 2018-12-15 MED ORDER — DEXAMETHASONE SODIUM PHOSPHATE 10 MG/ML IJ SOLN
INTRAMUSCULAR | Status: AC
  Filled 2018-12-15: qty 1

## 2018-12-15 MED ORDER — OXYCODONE-ACETAMINOPHEN 5-325 MG PO TABS
ORAL_TABLET | ORAL | Status: AC
  Filled 2018-12-15: qty 1

## 2018-12-15 MED ORDER — PHENYLEPHRINE 40 MCG/ML (10ML) SYRINGE FOR IV PUSH (FOR BLOOD PRESSURE SUPPORT)
PREFILLED_SYRINGE | INTRAVENOUS | Status: DC | PRN
  Administered 2018-12-15 (×2): 120 ug via INTRAVENOUS
  Administered 2018-12-15: 80 ug via INTRAVENOUS

## 2018-12-15 MED ORDER — ONDANSETRON HCL 4 MG/2ML IJ SOLN
INTRAMUSCULAR | Status: DC | PRN
  Administered 2018-12-15: 4 mg via INTRAVENOUS

## 2018-12-15 MED ORDER — SODIUM CHLORIDE 0.9 % IV SOLN
INTRAVENOUS | Status: DC | PRN
  Administered 2018-12-15: 500 mL

## 2018-12-15 SURGICAL SUPPLY — 30 items
ADH SKN CLS APL DERMABOND .7 (GAUZE/BANDAGES/DRESSINGS) ×1
ARMBAND PINK RESTRICT EXTREMIT (MISCELLANEOUS) ×2 IMPLANT
CANISTER SUCT 3000ML PPV (MISCELLANEOUS) ×2 IMPLANT
CLIP VESOCCLUDE MED 6/CT (CLIP) ×3 IMPLANT
CLIP VESOCCLUDE SM WIDE 6/CT (CLIP) ×2 IMPLANT
COVER PROBE W GEL 5X96 (DRAPES) ×2 IMPLANT
COVER WAND RF STERILE (DRAPES) ×1 IMPLANT
DERMABOND ADVANCED (GAUZE/BANDAGES/DRESSINGS) ×1
DERMABOND ADVANCED .7 DNX12 (GAUZE/BANDAGES/DRESSINGS) ×1 IMPLANT
ELECT REM PT RETURN 9FT ADLT (ELECTROSURGICAL) ×2
ELECTRODE REM PT RTRN 9FT ADLT (ELECTROSURGICAL) ×1 IMPLANT
GAUZE 4X4 16PLY RFD (DISPOSABLE) ×1 IMPLANT
GLOVE BIO SURGEON STRL SZ7.5 (GLOVE) ×2 IMPLANT
GOWN STRL REUS W/ TWL LRG LVL3 (GOWN DISPOSABLE) ×2 IMPLANT
GOWN STRL REUS W/ TWL XL LVL3 (GOWN DISPOSABLE) ×1 IMPLANT
GOWN STRL REUS W/TWL LRG LVL3 (GOWN DISPOSABLE) ×4
GOWN STRL REUS W/TWL XL LVL3 (GOWN DISPOSABLE) ×2
KIT BASIN OR (CUSTOM PROCEDURE TRAY) ×2 IMPLANT
KIT TURNOVER KIT B (KITS) ×2 IMPLANT
NS IRRIG 1000ML POUR BTL (IV SOLUTION) ×2 IMPLANT
PACK CV ACCESS (CUSTOM PROCEDURE TRAY) ×2 IMPLANT
PAD ARMBOARD 7.5X6 YLW CONV (MISCELLANEOUS) ×4 IMPLANT
SUT MNCRL AB 4-0 PS2 18 (SUTURE) ×4 IMPLANT
SUT PROLENE 6 0 BV (SUTURE) ×4 IMPLANT
SUT SILK 2 0 SH (SUTURE) ×1 IMPLANT
SUT VIC AB 3-0 SH 27 (SUTURE) ×4
SUT VIC AB 3-0 SH 27X BRD (SUTURE) ×1 IMPLANT
TOWEL GREEN STERILE (TOWEL DISPOSABLE) ×2 IMPLANT
UNDERPAD 30X30 (UNDERPADS AND DIAPERS) ×2 IMPLANT
WATER STERILE IRR 1000ML POUR (IV SOLUTION) ×2 IMPLANT

## 2018-12-15 NOTE — Anesthesia Preprocedure Evaluation (Signed)
Anesthesia Evaluation  Patient identified by MRN, date of birth, ID band Patient awake    Reviewed: Allergy & Precautions, NPO status , Patient's Chart, lab work & pertinent test results  Airway Mallampati: II  TM Distance: >3 FB     Dental   Pulmonary    breath sounds clear to auscultation       Cardiovascular hypertension, +CHF   Rhythm:Regular Rate:Normal     Neuro/Psych    GI/Hepatic negative GI ROS, Neg liver ROS,   Endo/Other  diabetesHypothyroidism   Renal/GU Renal disease     Musculoskeletal   Abdominal   Peds  Hematology   Anesthesia Other Findings   Reproductive/Obstetrics                             Anesthesia Physical Anesthesia Plan  ASA: III  Anesthesia Plan: General   Post-op Pain Management:    Induction:   PONV Risk Score and Plan: 3 and Ondansetron, Dexamethasone and Midazolam  Airway Management Planned: LMA  Additional Equipment:   Intra-op Plan:   Post-operative Plan: Extubation in OR  Informed Consent: I have reviewed the patients History and Physical, chart, labs and discussed the procedure including the risks, benefits and alternatives for the proposed anesthesia with the patient or authorized representative who has indicated his/her understanding and acceptance.     Dental advisory given  Plan Discussed with:   Anesthesia Plan Comments:         Anesthesia Quick Evaluation

## 2018-12-15 NOTE — Anesthesia Postprocedure Evaluation (Signed)
Anesthesia Post Note  Patient: Chelsea Roberts  Procedure(s) Performed: FISTULA SUPERFICIALIZATION LEFT ARM (Left Arm Upper)     Patient location during evaluation: PACU Anesthesia Type: General Level of consciousness: awake Pain management: pain level controlled Vital Signs Assessment: post-procedure vital signs reviewed and stable Respiratory status: spontaneous breathing Cardiovascular status: stable Postop Assessment: no apparent nausea or vomiting Anesthetic complications: no    Last Vitals:  Vitals:   12/15/18 0716 12/15/18 1135  BP: (!) 156/75 (!) 115/56  Pulse: 68 80  Resp: 17 (!) 21  Temp: 36.9 C 37.2 C  SpO2: 100% 100%    Last Pain:  Vitals:   12/15/18 1135  TempSrc:   PainSc: Asleep                 Archer Vise

## 2018-12-15 NOTE — Anesthesia Procedure Notes (Signed)
Procedure Name: Intubation Performed by: Milford Cage, CRNA Pre-anesthesia Checklist: Patient identified, Emergency Drugs available, Suction available and Patient being monitored Patient Re-evaluated:Patient Re-evaluated prior to induction Oxygen Delivery Method: Circle System Utilized Preoxygenation: Pre-oxygenation with 100% oxygen Induction Type: IV induction Ventilation: Oral airway inserted - appropriate to patient size and Mask ventilation without difficulty Laryngoscope Size: Mac and 4 Grade View: Grade I Tube type: Oral Tube size: 7.5 mm Number of attempts: 1 Airway Equipment and Method: Stylet and Oral airway Placement Confirmation: ETT inserted through vocal cords under direct vision,  positive ETCO2 and breath sounds checked- equal and bilateral Secured at: 24 cm Tube secured with: Tape Dental Injury: Teeth and Oropharynx as per pre-operative assessment

## 2018-12-15 NOTE — Op Note (Signed)
    Patient name: Chelsea Roberts MRN: [id number] DOB: 12-20-1955 Sex: female  12/15/2018 Pre-operative Diagnosis: End-stage renal disease, malfunction left arm AV fistula Post-operative diagnosis:  Same Surgeon:  Eda Paschal. Donzetta Matters, MD Assistant: Arlee Muslim, PA Procedure Performed:  Revision of left upper arm AV fistula with transposition cephalic vein  Indications: 63 year old female with history of end-stage renal disease.  Currently dialyzing Monday Wednesday Friday via catheter.  She is undergone fistulogram there is a tight stenosis proximally the fistula is also deep.  She is indicated for revision with probable transposition.  Findings: Fistula was healthy throughout its course.  This was transposed medially on her arm.  There is a tight stenosis this was transected spatulated.  Completion there was a strong thrill palpable radial pulse the wrist.   Procedure:  The patient was identified in the holding area and taken to the operating room where she is placed upon operative general anesthesia induced.  She was sterilely prepped and draped in the left upper extremity usual fashion antibiotics were administered timeout was called.  We began by opening her previous incision for we dissected down to the proximal fistula placed Vesseloops around this.  We made 2 separate skip incisions to the axilla.  We dissected out the vein marked for orientation.  Branches taken between clips and ties.  We clamped it near the arterial anastomosis and transected it.  We then tunneled it medially.  We completely excised the previously disease part and spatulated both ends.  They were sewn end-to-end with 6-0 Prolene suture.  Prior completion without flushing all direction.  Upon completion there was a strong thrill confirmed with Doppler.  There was palp radial pulse the wrist.  We irrigated the wounds closed in layers of Vicryl and Monocryl.  Dermabond placed to level skin.  She was awakened anesthesia having  tolerated procedure well there may complication.  All counts were correct at completion.  EBL: 100 cc    Jaydee Conran C. Donzetta Matters, MD [facility] [place] Office: [phone] Pager: [phone]

## 2018-12-15 NOTE — H&P (Signed)
    Established Dialysis Access   History of Present Illness   Chelsea Roberts is a 63 y.o. (04/21/1955) female who presents for re-evaluation of permanent access.  She is status post left brachiocephalic fistula creation by Dr. Donnetta Hutching in August of this year.  She was since started on hemodialysis in September via right IJ tunneled dialysis catheter.  She denies signs or symptoms of steal syndrome in left hand.  She was referred back to our office by Dr. Freddrick March to evaluate readiness of left arm fistula for use during hemodialysis.  She is dialyzing on a Monday Wednesday Friday schedule.  The patient's PMH, PSH, SH, and FamHx were reviewed and are unchanged from prior visit.        Current Outpatient Medications  Medication Sig Dispense Refill  . amLODipine (NORVASC) 10 MG tablet Take 1 tablet (10 mg total) by mouth daily. 30 tablet 2  . b complex-vitamin c-folic acid (NEPHRO-VITE) 0.8 MG TABS tablet Take 1 tablet by mouth at bedtime.    . carvedilol (COREG) 25 MG tablet Take 25 mg by mouth daily.     . Febuxostat (ULORIC) 80 MG TABS Take 1 tablet (80 mg total) by mouth daily. 30 tablet 5  . hydrALAZINE (APRESOLINE) 100 MG tablet Take 1 tablet (100 mg total) by mouth 3 (three) times daily. 90 tablet 2  . LORazepam (ATIVAN) 1 MG tablet Take 1 tablet (1 mg total) by mouth at bedtime. (Patient taking differently: Take 1 mg by mouth daily as needed. ) 30 tablet 0  . predniSONE (DELTASONE) 20 MG tablet Take 20 mg by mouth as needed.    . sertraline (ZOLOFT) 100 MG tablet Take 1.5 tablets (150 mg total) by mouth daily. PATIENT NEEDS OFFICE VISIT FOR ADDITIONAL REFILLS (Patient taking differently: Take 150 mg by mouth daily. ) 45 tablet 0  . sevelamer carbonate (RENVELA) 800 MG tablet Take 1 tablet (800 mg total) by mouth 3 (three) times daily with meals. 90 tablet 0   No current facility-administered medications for this visit.     On ROS today: 10 system ROS is negative unless  otherwise noted in HPI   Physical Examination      Vitals:   12/09/18 1516  BP: (!) 147/78  Pulse: 75  Resp: 12  Temp: 97.9 F (36.6 C)  TempSrc: Temporal  SpO2: 97%  Weight: 188 lb 3.2 oz (85.4 kg)  Height: 5\' 7"  (1.702 m)   Body mass index is 29.48 kg/m.  General Alert, O x 3, WD, NAD  Pulmonary Sym exp, good B air movt, CTA B  Cardiac RRR, Nl S1, S2,  Vascular Vessel Right Left  Radial Palpable Palpable  Brachial Palpable Palpable  Ulnar Not palpable Not palpable    Musculo- skeletal  palpable left radial pulse; easily palpable thrill fistula near anastomosis and distal upper arm; thrill more difficult to feel in mid to upper arm past area of narrowing  Neurologic A&O; CN grossly intact     Non-invasive Vascular Imaging   Patent left brachiocephalic fistula Stenotic area in distal upper arm with low flow volume beyond narrowing    Medical Decision Making   Chelsea Roberts is a 63 y.o. female who presents with ESRD requiring hemodialysis. She is need of left arm open revision. Will proceed in OR today.  Isabelle Matt C. Donzetta Matters, MD [facility] Office: [phone] Pager: [phone]

## 2018-12-15 NOTE — Discharge Instructions (Signed)
° °  [facility] ° °Discharge Instructions ° °AV Fistula or Graft Surgery for Dialysis Access ° °Please refer to the following instructions for your post-procedure care. Your surgeon or physician assistant will discuss any changes with you. ° °Activity ° °You may drive the day following your surgery, if you are comfortable and no longer taking prescription pain medication. Resume full activity as the soreness in your incision resolves. ° °Bathing/Showering ° °You may shower after you go home. Keep your incision dry for 48 hours. Do not soak in a bathtub, hot tub, or swim until the incision heals completely. You may not shower if you have a hemodialysis catheter. ° °Incision Care ° °Clean your incision with mild soap and water after 48 hours. Pat the area dry with a clean towel. You do not need a bandage unless otherwise instructed. Do not apply any ointments or creams to your incision. You may have skin glue on your incision. Do not peel it off. It will come off on its own in about one week. Your arm may swell a bit after surgery. To reduce swelling use pillows to elevate your arm so it is above your heart. Your doctor will tell you if you need to lightly wrap your arm with an ACE bandage. ° °Diet ° °Resume your normal diet. There are not special food restrictions following this procedure. In order to heal from your surgery, it is CRITICAL to get adequate nutrition. Your body requires vitamins, minerals, and protein. Vegetables are the best source of vitamins and minerals. Vegetables also provide the perfect balance of protein. Processed food has little nutritional value, so try to avoid this. ° °Medications ° °Resume taking all of your medications. If your incision is causing pain, you may take over-the counter pain relievers such as acetaminophen (Tylenol). If you were prescribed a stronger pain medication, please be aware these medications can cause nausea and constipation. Prevent  nausea by taking the medication with a snack or meal. Avoid constipation by drinking plenty of fluids and eating foods with high amount of fiber, such as fruits, vegetables, and grains. Do not take Tylenol if you are taking prescription pain medications. ° ° ° ° °Follow up °Your surgeon may want to see you in the office following your access surgery. If so, this will be arranged at the time of your surgery. ° °Please call us immediately for any of the following conditions: ° °Increased pain, redness, drainage (pus) from your incision site °Fever of 101 degrees or higher °Severe or worsening pain at your incision site °Hand pain or numbness. ° °Reduce your risk of vascular disease: ° °Stop smoking. If you would like help, call [facility] at 1-800-QUIT-NOW (1-800-784-8669) or [place] at 336-586-4000 ° °Manage your cholesterol °Maintain a desired weight °Control your diabetes °Keep your blood pressure down ° °Dialysis ° °It will take several weeks to several months for your new dialysis access to be ready for use. Your surgeon will determine when it is OK to use it. Your nephrologist will continue to direct your dialysis. You can continue to use your Permcath until your new access is ready for use. ° °If you have any questions, please call the office at [phone]. ° °

## 2018-12-15 NOTE — Transfer of Care (Signed)
Immediate Anesthesia Transfer of Care Note  Patient: Chelsea Roberts  Procedure(s) Performed: FISTULA SUPERFICIALIZATION LEFT ARM (Left Arm Upper)  Patient Location: PACU  Anesthesia Type:General  Level of Consciousness: drowsy, patient cooperative and responds to stimulation  Airway & Oxygen Therapy: Patient Spontanous Breathing and Patient connected to face mask oxygen  Post-op Assessment: Report given to RN and Post -op Vital signs reviewed and stable  Post vital signs: Reviewed and stable  Last Vitals:  Vitals Value Taken Time  BP 115/56 12/15/18 1135  Temp    Pulse 82 12/15/18 1137  Resp 21 12/15/18 1137  SpO2 95 % 12/15/18 1137  Vitals shown include unvalidated device data.  Last Pain:  Vitals:   12/15/18 0726  TempSrc:   PainSc: 0-No pain         Complications: No apparent anesthesia complications

## 2018-12-16 ENCOUNTER — Encounter (HOSPITAL_COMMUNITY): Payer: Self-pay | Admitting: Vascular Surgery

## 2019-01-07 ENCOUNTER — Ambulatory Visit (INDEPENDENT_AMBULATORY_CARE_PROVIDER_SITE_OTHER): Payer: Self-pay | Admitting: Physician Assistant

## 2019-01-07 ENCOUNTER — Other Ambulatory Visit: Payer: Self-pay

## 2019-01-07 VITALS — BP 147/83 | HR 85 | Temp 97.1°F | Ht 67.0 in | Wt 186.0 lb

## 2019-01-07 DIAGNOSIS — N186 End stage renal disease: Secondary | ICD-10-CM

## 2019-01-07 DIAGNOSIS — Z992 Dependence on renal dialysis: Secondary | ICD-10-CM

## 2019-01-07 NOTE — Progress Notes (Signed)
    Postoperative Access Visit   History of Present Illness   Chelsea Roberts is a 63 y.o. year old female who presents for postoperative follow-up for: left brachiocephalic fistula superficialization by Dr. Donzetta Matters on 12/15/2018.  Brachiocephalic fistula was initially created by Dr. Donnetta Hutching in August 2020.  She states left arm incisions are well-healed.  She denies any signs or symptoms of steal syndrome in left hand.  She is currently dialyzing via right IJ [facility] since September of this year.  She is dialyzing at [facility] kidney center on a Monday Wednesday Friday under the care of Dr. Jimmy Footman.    Physical Examination   Vitals:   01/07/19 1029  BP: (!) 147/83  Pulse: 85  Temp: (!) 97.1 F (36.2 C)  TempSrc: Temporal  SpO2: 100%  Weight: 186 lb (84.4 kg)  Height: 5\' 7"  (1.702 m)   Body mass index is 29.13 kg/m.  left arm Incisions are healed, radial pulse, hand grip is 5/5, sensation in digits is  intact, palpable thrill, bruit can be auscultated     Medical Decision Making   Chelsea Roberts is a 63 y.o. year old female who presents s/p left brachiocephalic fistula superficialization   Patent brachiocephalic fistula without signs or symptoms of steal syndrome  The patient's access will be ready for use 01/21/2019  The patient's tunneled dialysis catheter can be removed when Nephrology is comfortable with the performance of the fistula  The patient may follow up on a prn basis   Dagoberto Ligas PA-C [facility] Office: [phone]  Clinic MD: Dr. Donzetta Matters

## 2019-02-21 DIAGNOSIS — D689 Coagulation defect, unspecified: Secondary | ICD-10-CM | POA: Diagnosis not present

## 2019-02-21 DIAGNOSIS — Z992 Dependence on renal dialysis: Secondary | ICD-10-CM | POA: Diagnosis not present

## 2019-02-21 DIAGNOSIS — D509 Iron deficiency anemia, unspecified: Secondary | ICD-10-CM | POA: Diagnosis not present

## 2019-02-21 DIAGNOSIS — N2581 Secondary hyperparathyroidism of renal origin: Secondary | ICD-10-CM | POA: Diagnosis not present

## 2019-02-21 DIAGNOSIS — N186 End stage renal disease: Secondary | ICD-10-CM | POA: Diagnosis not present

## 2019-02-21 DIAGNOSIS — T8249XA Other complication of vascular dialysis catheter, initial encounter: Secondary | ICD-10-CM | POA: Diagnosis not present

## 2019-02-21 DIAGNOSIS — E1129 Type 2 diabetes mellitus with other diabetic kidney complication: Secondary | ICD-10-CM | POA: Diagnosis not present

## 2019-02-21 DIAGNOSIS — D631 Anemia in chronic kidney disease: Secondary | ICD-10-CM | POA: Diagnosis not present

## 2019-02-23 DIAGNOSIS — T8249XA Other complication of vascular dialysis catheter, initial encounter: Secondary | ICD-10-CM | POA: Diagnosis not present

## 2019-02-23 DIAGNOSIS — E1129 Type 2 diabetes mellitus with other diabetic kidney complication: Secondary | ICD-10-CM | POA: Diagnosis not present

## 2019-02-23 DIAGNOSIS — N186 End stage renal disease: Secondary | ICD-10-CM | POA: Diagnosis not present

## 2019-02-23 DIAGNOSIS — N2581 Secondary hyperparathyroidism of renal origin: Secondary | ICD-10-CM | POA: Diagnosis not present

## 2019-02-23 DIAGNOSIS — D509 Iron deficiency anemia, unspecified: Secondary | ICD-10-CM | POA: Diagnosis not present

## 2019-02-23 DIAGNOSIS — Z992 Dependence on renal dialysis: Secondary | ICD-10-CM | POA: Diagnosis not present

## 2019-02-23 DIAGNOSIS — D631 Anemia in chronic kidney disease: Secondary | ICD-10-CM | POA: Diagnosis not present

## 2019-02-23 DIAGNOSIS — D689 Coagulation defect, unspecified: Secondary | ICD-10-CM | POA: Diagnosis not present

## 2019-02-25 DIAGNOSIS — I120 Hypertensive chronic kidney disease with stage 5 chronic kidney disease or end stage renal disease: Secondary | ICD-10-CM | POA: Diagnosis not present

## 2019-02-25 DIAGNOSIS — M130 Polyarthritis, unspecified: Secondary | ICD-10-CM | POA: Diagnosis not present

## 2019-02-25 DIAGNOSIS — I10 Essential (primary) hypertension: Secondary | ICD-10-CM | POA: Diagnosis not present

## 2019-02-26 DIAGNOSIS — M25561 Pain in right knee: Secondary | ICD-10-CM | POA: Diagnosis not present

## 2019-02-28 DIAGNOSIS — E1129 Type 2 diabetes mellitus with other diabetic kidney complication: Secondary | ICD-10-CM | POA: Diagnosis not present

## 2019-02-28 DIAGNOSIS — Z992 Dependence on renal dialysis: Secondary | ICD-10-CM | POA: Diagnosis not present

## 2019-02-28 DIAGNOSIS — D689 Coagulation defect, unspecified: Secondary | ICD-10-CM | POA: Diagnosis not present

## 2019-02-28 DIAGNOSIS — N186 End stage renal disease: Secondary | ICD-10-CM | POA: Diagnosis not present

## 2019-02-28 DIAGNOSIS — D631 Anemia in chronic kidney disease: Secondary | ICD-10-CM | POA: Diagnosis not present

## 2019-02-28 DIAGNOSIS — T8249XA Other complication of vascular dialysis catheter, initial encounter: Secondary | ICD-10-CM | POA: Diagnosis not present

## 2019-02-28 DIAGNOSIS — N2581 Secondary hyperparathyroidism of renal origin: Secondary | ICD-10-CM | POA: Diagnosis not present

## 2019-02-28 DIAGNOSIS — D509 Iron deficiency anemia, unspecified: Secondary | ICD-10-CM | POA: Diagnosis not present

## 2019-03-02 DIAGNOSIS — D509 Iron deficiency anemia, unspecified: Secondary | ICD-10-CM | POA: Diagnosis not present

## 2019-03-02 DIAGNOSIS — Z992 Dependence on renal dialysis: Secondary | ICD-10-CM | POA: Diagnosis not present

## 2019-03-02 DIAGNOSIS — N2581 Secondary hyperparathyroidism of renal origin: Secondary | ICD-10-CM | POA: Diagnosis not present

## 2019-03-02 DIAGNOSIS — N186 End stage renal disease: Secondary | ICD-10-CM | POA: Diagnosis not present

## 2019-03-02 DIAGNOSIS — D631 Anemia in chronic kidney disease: Secondary | ICD-10-CM | POA: Diagnosis not present

## 2019-03-02 DIAGNOSIS — D689 Coagulation defect, unspecified: Secondary | ICD-10-CM | POA: Diagnosis not present

## 2019-03-02 DIAGNOSIS — T8249XA Other complication of vascular dialysis catheter, initial encounter: Secondary | ICD-10-CM | POA: Diagnosis not present

## 2019-03-02 DIAGNOSIS — E1129 Type 2 diabetes mellitus with other diabetic kidney complication: Secondary | ICD-10-CM | POA: Diagnosis not present

## 2019-03-04 DIAGNOSIS — E1129 Type 2 diabetes mellitus with other diabetic kidney complication: Secondary | ICD-10-CM | POA: Diagnosis not present

## 2019-03-04 DIAGNOSIS — T8249XA Other complication of vascular dialysis catheter, initial encounter: Secondary | ICD-10-CM | POA: Diagnosis not present

## 2019-03-04 DIAGNOSIS — D689 Coagulation defect, unspecified: Secondary | ICD-10-CM | POA: Diagnosis not present

## 2019-03-04 DIAGNOSIS — N186 End stage renal disease: Secondary | ICD-10-CM | POA: Diagnosis not present

## 2019-03-04 DIAGNOSIS — Z992 Dependence on renal dialysis: Secondary | ICD-10-CM | POA: Diagnosis not present

## 2019-03-04 DIAGNOSIS — D631 Anemia in chronic kidney disease: Secondary | ICD-10-CM | POA: Diagnosis not present

## 2019-03-04 DIAGNOSIS — N2581 Secondary hyperparathyroidism of renal origin: Secondary | ICD-10-CM | POA: Diagnosis not present

## 2019-03-04 DIAGNOSIS — D509 Iron deficiency anemia, unspecified: Secondary | ICD-10-CM | POA: Diagnosis not present

## 2019-03-07 DIAGNOSIS — Z992 Dependence on renal dialysis: Secondary | ICD-10-CM | POA: Diagnosis not present

## 2019-03-07 DIAGNOSIS — D509 Iron deficiency anemia, unspecified: Secondary | ICD-10-CM | POA: Diagnosis not present

## 2019-03-07 DIAGNOSIS — N186 End stage renal disease: Secondary | ICD-10-CM | POA: Diagnosis not present

## 2019-03-07 DIAGNOSIS — D689 Coagulation defect, unspecified: Secondary | ICD-10-CM | POA: Diagnosis not present

## 2019-03-07 DIAGNOSIS — E1129 Type 2 diabetes mellitus with other diabetic kidney complication: Secondary | ICD-10-CM | POA: Diagnosis not present

## 2019-03-07 DIAGNOSIS — T8249XA Other complication of vascular dialysis catheter, initial encounter: Secondary | ICD-10-CM | POA: Diagnosis not present

## 2019-03-07 DIAGNOSIS — N2581 Secondary hyperparathyroidism of renal origin: Secondary | ICD-10-CM | POA: Diagnosis not present

## 2019-03-07 DIAGNOSIS — D631 Anemia in chronic kidney disease: Secondary | ICD-10-CM | POA: Diagnosis not present

## 2019-03-09 DIAGNOSIS — N2581 Secondary hyperparathyroidism of renal origin: Secondary | ICD-10-CM | POA: Diagnosis not present

## 2019-03-09 DIAGNOSIS — E1129 Type 2 diabetes mellitus with other diabetic kidney complication: Secondary | ICD-10-CM | POA: Diagnosis not present

## 2019-03-09 DIAGNOSIS — D509 Iron deficiency anemia, unspecified: Secondary | ICD-10-CM | POA: Diagnosis not present

## 2019-03-09 DIAGNOSIS — T8249XA Other complication of vascular dialysis catheter, initial encounter: Secondary | ICD-10-CM | POA: Diagnosis not present

## 2019-03-09 DIAGNOSIS — D689 Coagulation defect, unspecified: Secondary | ICD-10-CM | POA: Diagnosis not present

## 2019-03-09 DIAGNOSIS — N186 End stage renal disease: Secondary | ICD-10-CM | POA: Diagnosis not present

## 2019-03-09 DIAGNOSIS — Z992 Dependence on renal dialysis: Secondary | ICD-10-CM | POA: Diagnosis not present

## 2019-03-09 DIAGNOSIS — D631 Anemia in chronic kidney disease: Secondary | ICD-10-CM | POA: Diagnosis not present

## 2019-03-11 DIAGNOSIS — T8249XA Other complication of vascular dialysis catheter, initial encounter: Secondary | ICD-10-CM | POA: Diagnosis not present

## 2019-03-11 DIAGNOSIS — D509 Iron deficiency anemia, unspecified: Secondary | ICD-10-CM | POA: Diagnosis not present

## 2019-03-11 DIAGNOSIS — N2581 Secondary hyperparathyroidism of renal origin: Secondary | ICD-10-CM | POA: Diagnosis not present

## 2019-03-11 DIAGNOSIS — E1129 Type 2 diabetes mellitus with other diabetic kidney complication: Secondary | ICD-10-CM | POA: Diagnosis not present

## 2019-03-11 DIAGNOSIS — N186 End stage renal disease: Secondary | ICD-10-CM | POA: Diagnosis not present

## 2019-03-11 DIAGNOSIS — D631 Anemia in chronic kidney disease: Secondary | ICD-10-CM | POA: Diagnosis not present

## 2019-03-11 DIAGNOSIS — Z992 Dependence on renal dialysis: Secondary | ICD-10-CM | POA: Diagnosis not present

## 2019-03-11 DIAGNOSIS — D689 Coagulation defect, unspecified: Secondary | ICD-10-CM | POA: Diagnosis not present

## 2019-03-14 DIAGNOSIS — D631 Anemia in chronic kidney disease: Secondary | ICD-10-CM | POA: Diagnosis not present

## 2019-03-14 DIAGNOSIS — D509 Iron deficiency anemia, unspecified: Secondary | ICD-10-CM | POA: Diagnosis not present

## 2019-03-14 DIAGNOSIS — E1129 Type 2 diabetes mellitus with other diabetic kidney complication: Secondary | ICD-10-CM | POA: Diagnosis not present

## 2019-03-14 DIAGNOSIS — T8249XA Other complication of vascular dialysis catheter, initial encounter: Secondary | ICD-10-CM | POA: Diagnosis not present

## 2019-03-14 DIAGNOSIS — Z992 Dependence on renal dialysis: Secondary | ICD-10-CM | POA: Diagnosis not present

## 2019-03-14 DIAGNOSIS — D689 Coagulation defect, unspecified: Secondary | ICD-10-CM | POA: Diagnosis not present

## 2019-03-14 DIAGNOSIS — N186 End stage renal disease: Secondary | ICD-10-CM | POA: Diagnosis not present

## 2019-03-14 DIAGNOSIS — N2581 Secondary hyperparathyroidism of renal origin: Secondary | ICD-10-CM | POA: Diagnosis not present

## 2019-03-16 DIAGNOSIS — T8249XA Other complication of vascular dialysis catheter, initial encounter: Secondary | ICD-10-CM | POA: Diagnosis not present

## 2019-03-16 DIAGNOSIS — D631 Anemia in chronic kidney disease: Secondary | ICD-10-CM | POA: Diagnosis not present

## 2019-03-16 DIAGNOSIS — N2581 Secondary hyperparathyroidism of renal origin: Secondary | ICD-10-CM | POA: Diagnosis not present

## 2019-03-16 DIAGNOSIS — N186 End stage renal disease: Secondary | ICD-10-CM | POA: Diagnosis not present

## 2019-03-16 DIAGNOSIS — E1129 Type 2 diabetes mellitus with other diabetic kidney complication: Secondary | ICD-10-CM | POA: Diagnosis not present

## 2019-03-16 DIAGNOSIS — D509 Iron deficiency anemia, unspecified: Secondary | ICD-10-CM | POA: Diagnosis not present

## 2019-03-16 DIAGNOSIS — Z992 Dependence on renal dialysis: Secondary | ICD-10-CM | POA: Diagnosis not present

## 2019-03-16 DIAGNOSIS — D689 Coagulation defect, unspecified: Secondary | ICD-10-CM | POA: Diagnosis not present

## 2019-03-18 DIAGNOSIS — D689 Coagulation defect, unspecified: Secondary | ICD-10-CM | POA: Diagnosis not present

## 2019-03-18 DIAGNOSIS — D631 Anemia in chronic kidney disease: Secondary | ICD-10-CM | POA: Diagnosis not present

## 2019-03-18 DIAGNOSIS — T8249XA Other complication of vascular dialysis catheter, initial encounter: Secondary | ICD-10-CM | POA: Diagnosis not present

## 2019-03-18 DIAGNOSIS — D509 Iron deficiency anemia, unspecified: Secondary | ICD-10-CM | POA: Diagnosis not present

## 2019-03-18 DIAGNOSIS — E1129 Type 2 diabetes mellitus with other diabetic kidney complication: Secondary | ICD-10-CM | POA: Diagnosis not present

## 2019-03-18 DIAGNOSIS — N2581 Secondary hyperparathyroidism of renal origin: Secondary | ICD-10-CM | POA: Diagnosis not present

## 2019-03-18 DIAGNOSIS — N186 End stage renal disease: Secondary | ICD-10-CM | POA: Diagnosis not present

## 2019-03-18 DIAGNOSIS — Z992 Dependence on renal dialysis: Secondary | ICD-10-CM | POA: Diagnosis not present

## 2019-03-20 DIAGNOSIS — N186 End stage renal disease: Secondary | ICD-10-CM | POA: Diagnosis not present

## 2019-03-20 DIAGNOSIS — I129 Hypertensive chronic kidney disease with stage 1 through stage 4 chronic kidney disease, or unspecified chronic kidney disease: Secondary | ICD-10-CM | POA: Diagnosis not present

## 2019-03-20 DIAGNOSIS — Z992 Dependence on renal dialysis: Secondary | ICD-10-CM | POA: Diagnosis not present

## 2019-03-21 DIAGNOSIS — D631 Anemia in chronic kidney disease: Secondary | ICD-10-CM | POA: Diagnosis not present

## 2019-03-21 DIAGNOSIS — Z992 Dependence on renal dialysis: Secondary | ICD-10-CM | POA: Diagnosis not present

## 2019-03-21 DIAGNOSIS — N2581 Secondary hyperparathyroidism of renal origin: Secondary | ICD-10-CM | POA: Diagnosis not present

## 2019-03-21 DIAGNOSIS — D509 Iron deficiency anemia, unspecified: Secondary | ICD-10-CM | POA: Diagnosis not present

## 2019-03-21 DIAGNOSIS — D689 Coagulation defect, unspecified: Secondary | ICD-10-CM | POA: Diagnosis not present

## 2019-03-21 DIAGNOSIS — T8249XA Other complication of vascular dialysis catheter, initial encounter: Secondary | ICD-10-CM | POA: Diagnosis not present

## 2019-03-21 DIAGNOSIS — N186 End stage renal disease: Secondary | ICD-10-CM | POA: Diagnosis not present

## 2019-03-21 DIAGNOSIS — E1129 Type 2 diabetes mellitus with other diabetic kidney complication: Secondary | ICD-10-CM | POA: Diagnosis not present

## 2019-03-23 DIAGNOSIS — Z992 Dependence on renal dialysis: Secondary | ICD-10-CM | POA: Diagnosis not present

## 2019-03-23 DIAGNOSIS — T8249XA Other complication of vascular dialysis catheter, initial encounter: Secondary | ICD-10-CM | POA: Diagnosis not present

## 2019-03-23 DIAGNOSIS — D631 Anemia in chronic kidney disease: Secondary | ICD-10-CM | POA: Diagnosis not present

## 2019-03-23 DIAGNOSIS — D689 Coagulation defect, unspecified: Secondary | ICD-10-CM | POA: Diagnosis not present

## 2019-03-23 DIAGNOSIS — N2581 Secondary hyperparathyroidism of renal origin: Secondary | ICD-10-CM | POA: Diagnosis not present

## 2019-03-23 DIAGNOSIS — E1129 Type 2 diabetes mellitus with other diabetic kidney complication: Secondary | ICD-10-CM | POA: Diagnosis not present

## 2019-03-23 DIAGNOSIS — N186 End stage renal disease: Secondary | ICD-10-CM | POA: Diagnosis not present

## 2019-03-23 DIAGNOSIS — D509 Iron deficiency anemia, unspecified: Secondary | ICD-10-CM | POA: Diagnosis not present

## 2019-03-24 DIAGNOSIS — I10 Essential (primary) hypertension: Secondary | ICD-10-CM | POA: Diagnosis not present

## 2019-03-25 DIAGNOSIS — D689 Coagulation defect, unspecified: Secondary | ICD-10-CM | POA: Diagnosis not present

## 2019-03-25 DIAGNOSIS — T8249XA Other complication of vascular dialysis catheter, initial encounter: Secondary | ICD-10-CM | POA: Diagnosis not present

## 2019-03-25 DIAGNOSIS — E1129 Type 2 diabetes mellitus with other diabetic kidney complication: Secondary | ICD-10-CM | POA: Diagnosis not present

## 2019-03-25 DIAGNOSIS — N2581 Secondary hyperparathyroidism of renal origin: Secondary | ICD-10-CM | POA: Diagnosis not present

## 2019-03-25 DIAGNOSIS — Z992 Dependence on renal dialysis: Secondary | ICD-10-CM | POA: Diagnosis not present

## 2019-03-25 DIAGNOSIS — D509 Iron deficiency anemia, unspecified: Secondary | ICD-10-CM | POA: Diagnosis not present

## 2019-03-25 DIAGNOSIS — D631 Anemia in chronic kidney disease: Secondary | ICD-10-CM | POA: Diagnosis not present

## 2019-03-25 DIAGNOSIS — N186 End stage renal disease: Secondary | ICD-10-CM | POA: Diagnosis not present

## 2019-03-29 DIAGNOSIS — Z452 Encounter for adjustment and management of vascular access device: Secondary | ICD-10-CM | POA: Diagnosis not present

## 2019-03-30 DIAGNOSIS — D631 Anemia in chronic kidney disease: Secondary | ICD-10-CM | POA: Diagnosis not present

## 2019-03-30 DIAGNOSIS — T8249XA Other complication of vascular dialysis catheter, initial encounter: Secondary | ICD-10-CM | POA: Diagnosis not present

## 2019-03-30 DIAGNOSIS — D509 Iron deficiency anemia, unspecified: Secondary | ICD-10-CM | POA: Diagnosis not present

## 2019-03-30 DIAGNOSIS — D689 Coagulation defect, unspecified: Secondary | ICD-10-CM | POA: Diagnosis not present

## 2019-03-30 DIAGNOSIS — N2581 Secondary hyperparathyroidism of renal origin: Secondary | ICD-10-CM | POA: Diagnosis not present

## 2019-03-30 DIAGNOSIS — E1129 Type 2 diabetes mellitus with other diabetic kidney complication: Secondary | ICD-10-CM | POA: Diagnosis not present

## 2019-03-30 DIAGNOSIS — Z992 Dependence on renal dialysis: Secondary | ICD-10-CM | POA: Diagnosis not present

## 2019-03-30 DIAGNOSIS — N186 End stage renal disease: Secondary | ICD-10-CM | POA: Diagnosis not present

## 2019-04-01 DIAGNOSIS — D631 Anemia in chronic kidney disease: Secondary | ICD-10-CM | POA: Diagnosis not present

## 2019-04-01 DIAGNOSIS — N2581 Secondary hyperparathyroidism of renal origin: Secondary | ICD-10-CM | POA: Diagnosis not present

## 2019-04-01 DIAGNOSIS — N186 End stage renal disease: Secondary | ICD-10-CM | POA: Diagnosis not present

## 2019-04-01 DIAGNOSIS — T8249XA Other complication of vascular dialysis catheter, initial encounter: Secondary | ICD-10-CM | POA: Diagnosis not present

## 2019-04-01 DIAGNOSIS — E1129 Type 2 diabetes mellitus with other diabetic kidney complication: Secondary | ICD-10-CM | POA: Diagnosis not present

## 2019-04-01 DIAGNOSIS — D689 Coagulation defect, unspecified: Secondary | ICD-10-CM | POA: Diagnosis not present

## 2019-04-01 DIAGNOSIS — Z992 Dependence on renal dialysis: Secondary | ICD-10-CM | POA: Diagnosis not present

## 2019-04-01 DIAGNOSIS — D509 Iron deficiency anemia, unspecified: Secondary | ICD-10-CM | POA: Diagnosis not present

## 2019-04-04 DIAGNOSIS — N186 End stage renal disease: Secondary | ICD-10-CM | POA: Diagnosis not present

## 2019-04-04 DIAGNOSIS — E1129 Type 2 diabetes mellitus with other diabetic kidney complication: Secondary | ICD-10-CM | POA: Diagnosis not present

## 2019-04-04 DIAGNOSIS — D509 Iron deficiency anemia, unspecified: Secondary | ICD-10-CM | POA: Diagnosis not present

## 2019-04-04 DIAGNOSIS — Z992 Dependence on renal dialysis: Secondary | ICD-10-CM | POA: Diagnosis not present

## 2019-04-04 DIAGNOSIS — T8249XA Other complication of vascular dialysis catheter, initial encounter: Secondary | ICD-10-CM | POA: Diagnosis not present

## 2019-04-04 DIAGNOSIS — D631 Anemia in chronic kidney disease: Secondary | ICD-10-CM | POA: Diagnosis not present

## 2019-04-04 DIAGNOSIS — D689 Coagulation defect, unspecified: Secondary | ICD-10-CM | POA: Diagnosis not present

## 2019-04-04 DIAGNOSIS — N2581 Secondary hyperparathyroidism of renal origin: Secondary | ICD-10-CM | POA: Diagnosis not present

## 2019-04-06 DIAGNOSIS — D631 Anemia in chronic kidney disease: Secondary | ICD-10-CM | POA: Diagnosis not present

## 2019-04-06 DIAGNOSIS — N186 End stage renal disease: Secondary | ICD-10-CM | POA: Diagnosis not present

## 2019-04-06 DIAGNOSIS — N2581 Secondary hyperparathyroidism of renal origin: Secondary | ICD-10-CM | POA: Diagnosis not present

## 2019-04-06 DIAGNOSIS — E1129 Type 2 diabetes mellitus with other diabetic kidney complication: Secondary | ICD-10-CM | POA: Diagnosis not present

## 2019-04-06 DIAGNOSIS — D689 Coagulation defect, unspecified: Secondary | ICD-10-CM | POA: Diagnosis not present

## 2019-04-06 DIAGNOSIS — D509 Iron deficiency anemia, unspecified: Secondary | ICD-10-CM | POA: Diagnosis not present

## 2019-04-06 DIAGNOSIS — Z992 Dependence on renal dialysis: Secondary | ICD-10-CM | POA: Diagnosis not present

## 2019-04-06 DIAGNOSIS — T8249XA Other complication of vascular dialysis catheter, initial encounter: Secondary | ICD-10-CM | POA: Diagnosis not present

## 2019-04-07 DIAGNOSIS — M25561 Pain in right knee: Secondary | ICD-10-CM | POA: Diagnosis not present

## 2019-04-08 DIAGNOSIS — N2581 Secondary hyperparathyroidism of renal origin: Secondary | ICD-10-CM | POA: Diagnosis not present

## 2019-04-08 DIAGNOSIS — D631 Anemia in chronic kidney disease: Secondary | ICD-10-CM | POA: Diagnosis not present

## 2019-04-08 DIAGNOSIS — N186 End stage renal disease: Secondary | ICD-10-CM | POA: Diagnosis not present

## 2019-04-08 DIAGNOSIS — D689 Coagulation defect, unspecified: Secondary | ICD-10-CM | POA: Diagnosis not present

## 2019-04-08 DIAGNOSIS — T8249XA Other complication of vascular dialysis catheter, initial encounter: Secondary | ICD-10-CM | POA: Diagnosis not present

## 2019-04-08 DIAGNOSIS — Z992 Dependence on renal dialysis: Secondary | ICD-10-CM | POA: Diagnosis not present

## 2019-04-08 DIAGNOSIS — D509 Iron deficiency anemia, unspecified: Secondary | ICD-10-CM | POA: Diagnosis not present

## 2019-04-08 DIAGNOSIS — E1129 Type 2 diabetes mellitus with other diabetic kidney complication: Secondary | ICD-10-CM | POA: Diagnosis not present

## 2019-04-11 DIAGNOSIS — D631 Anemia in chronic kidney disease: Secondary | ICD-10-CM | POA: Diagnosis not present

## 2019-04-11 DIAGNOSIS — E1129 Type 2 diabetes mellitus with other diabetic kidney complication: Secondary | ICD-10-CM | POA: Diagnosis not present

## 2019-04-11 DIAGNOSIS — T8249XA Other complication of vascular dialysis catheter, initial encounter: Secondary | ICD-10-CM | POA: Diagnosis not present

## 2019-04-11 DIAGNOSIS — D689 Coagulation defect, unspecified: Secondary | ICD-10-CM | POA: Diagnosis not present

## 2019-04-11 DIAGNOSIS — D509 Iron deficiency anemia, unspecified: Secondary | ICD-10-CM | POA: Diagnosis not present

## 2019-04-11 DIAGNOSIS — N186 End stage renal disease: Secondary | ICD-10-CM | POA: Diagnosis not present

## 2019-04-11 DIAGNOSIS — N2581 Secondary hyperparathyroidism of renal origin: Secondary | ICD-10-CM | POA: Diagnosis not present

## 2019-04-11 DIAGNOSIS — Z992 Dependence on renal dialysis: Secondary | ICD-10-CM | POA: Diagnosis not present

## 2019-04-12 DIAGNOSIS — N186 End stage renal disease: Secondary | ICD-10-CM | POA: Diagnosis not present

## 2019-04-12 DIAGNOSIS — Z01812 Encounter for preprocedural laboratory examination: Secondary | ICD-10-CM | POA: Diagnosis not present

## 2019-04-13 DIAGNOSIS — Z992 Dependence on renal dialysis: Secondary | ICD-10-CM | POA: Diagnosis not present

## 2019-04-13 DIAGNOSIS — D509 Iron deficiency anemia, unspecified: Secondary | ICD-10-CM | POA: Diagnosis not present

## 2019-04-13 DIAGNOSIS — D631 Anemia in chronic kidney disease: Secondary | ICD-10-CM | POA: Diagnosis not present

## 2019-04-13 DIAGNOSIS — N2581 Secondary hyperparathyroidism of renal origin: Secondary | ICD-10-CM | POA: Diagnosis not present

## 2019-04-13 DIAGNOSIS — N186 End stage renal disease: Secondary | ICD-10-CM | POA: Diagnosis not present

## 2019-04-13 DIAGNOSIS — T8249XA Other complication of vascular dialysis catheter, initial encounter: Secondary | ICD-10-CM | POA: Diagnosis not present

## 2019-04-13 DIAGNOSIS — D689 Coagulation defect, unspecified: Secondary | ICD-10-CM | POA: Diagnosis not present

## 2019-04-13 DIAGNOSIS — E1129 Type 2 diabetes mellitus with other diabetic kidney complication: Secondary | ICD-10-CM | POA: Diagnosis not present

## 2019-04-15 DIAGNOSIS — N186 End stage renal disease: Secondary | ICD-10-CM | POA: Diagnosis not present

## 2019-04-15 DIAGNOSIS — Z992 Dependence on renal dialysis: Secondary | ICD-10-CM | POA: Diagnosis not present

## 2019-04-15 DIAGNOSIS — D631 Anemia in chronic kidney disease: Secondary | ICD-10-CM | POA: Diagnosis not present

## 2019-04-15 DIAGNOSIS — D509 Iron deficiency anemia, unspecified: Secondary | ICD-10-CM | POA: Diagnosis not present

## 2019-04-15 DIAGNOSIS — E1129 Type 2 diabetes mellitus with other diabetic kidney complication: Secondary | ICD-10-CM | POA: Diagnosis not present

## 2019-04-15 DIAGNOSIS — N2581 Secondary hyperparathyroidism of renal origin: Secondary | ICD-10-CM | POA: Diagnosis not present

## 2019-04-15 DIAGNOSIS — T8249XA Other complication of vascular dialysis catheter, initial encounter: Secondary | ICD-10-CM | POA: Diagnosis not present

## 2019-04-15 DIAGNOSIS — D689 Coagulation defect, unspecified: Secondary | ICD-10-CM | POA: Diagnosis not present

## 2019-04-18 DIAGNOSIS — E1129 Type 2 diabetes mellitus with other diabetic kidney complication: Secondary | ICD-10-CM | POA: Diagnosis not present

## 2019-04-18 DIAGNOSIS — N2581 Secondary hyperparathyroidism of renal origin: Secondary | ICD-10-CM | POA: Diagnosis not present

## 2019-04-18 DIAGNOSIS — D631 Anemia in chronic kidney disease: Secondary | ICD-10-CM | POA: Diagnosis not present

## 2019-04-18 DIAGNOSIS — Z992 Dependence on renal dialysis: Secondary | ICD-10-CM | POA: Diagnosis not present

## 2019-04-18 DIAGNOSIS — N186 End stage renal disease: Secondary | ICD-10-CM | POA: Diagnosis not present

## 2019-04-18 DIAGNOSIS — D689 Coagulation defect, unspecified: Secondary | ICD-10-CM | POA: Diagnosis not present

## 2019-04-18 DIAGNOSIS — D509 Iron deficiency anemia, unspecified: Secondary | ICD-10-CM | POA: Diagnosis not present

## 2019-04-18 DIAGNOSIS — T8249XA Other complication of vascular dialysis catheter, initial encounter: Secondary | ICD-10-CM | POA: Diagnosis not present

## 2019-04-19 DIAGNOSIS — M25571 Pain in right ankle and joints of right foot: Secondary | ICD-10-CM | POA: Diagnosis not present

## 2019-04-20 DIAGNOSIS — I129 Hypertensive chronic kidney disease with stage 1 through stage 4 chronic kidney disease, or unspecified chronic kidney disease: Secondary | ICD-10-CM | POA: Diagnosis not present

## 2019-04-20 DIAGNOSIS — Z992 Dependence on renal dialysis: Secondary | ICD-10-CM | POA: Diagnosis not present

## 2019-04-20 DIAGNOSIS — D631 Anemia in chronic kidney disease: Secondary | ICD-10-CM | POA: Diagnosis not present

## 2019-04-20 DIAGNOSIS — N186 End stage renal disease: Secondary | ICD-10-CM | POA: Diagnosis not present

## 2019-04-20 DIAGNOSIS — D509 Iron deficiency anemia, unspecified: Secondary | ICD-10-CM | POA: Diagnosis not present

## 2019-04-20 DIAGNOSIS — T8249XA Other complication of vascular dialysis catheter, initial encounter: Secondary | ICD-10-CM | POA: Diagnosis not present

## 2019-04-20 DIAGNOSIS — D689 Coagulation defect, unspecified: Secondary | ICD-10-CM | POA: Diagnosis not present

## 2019-04-20 DIAGNOSIS — E1129 Type 2 diabetes mellitus with other diabetic kidney complication: Secondary | ICD-10-CM | POA: Diagnosis not present

## 2019-04-20 DIAGNOSIS — N2581 Secondary hyperparathyroidism of renal origin: Secondary | ICD-10-CM | POA: Diagnosis not present

## 2019-04-21 DIAGNOSIS — M25571 Pain in right ankle and joints of right foot: Secondary | ICD-10-CM | POA: Diagnosis not present

## 2019-04-22 DIAGNOSIS — N186 End stage renal disease: Secondary | ICD-10-CM | POA: Diagnosis not present

## 2019-04-22 DIAGNOSIS — D689 Coagulation defect, unspecified: Secondary | ICD-10-CM | POA: Diagnosis not present

## 2019-04-22 DIAGNOSIS — R52 Pain, unspecified: Secondary | ICD-10-CM | POA: Diagnosis not present

## 2019-04-22 DIAGNOSIS — E1129 Type 2 diabetes mellitus with other diabetic kidney complication: Secondary | ICD-10-CM | POA: Diagnosis not present

## 2019-04-22 DIAGNOSIS — D509 Iron deficiency anemia, unspecified: Secondary | ICD-10-CM | POA: Diagnosis not present

## 2019-04-22 DIAGNOSIS — Z992 Dependence on renal dialysis: Secondary | ICD-10-CM | POA: Diagnosis not present

## 2019-04-22 DIAGNOSIS — N2581 Secondary hyperparathyroidism of renal origin: Secondary | ICD-10-CM | POA: Diagnosis not present

## 2019-04-25 DIAGNOSIS — E1129 Type 2 diabetes mellitus with other diabetic kidney complication: Secondary | ICD-10-CM | POA: Diagnosis not present

## 2019-04-25 DIAGNOSIS — D689 Coagulation defect, unspecified: Secondary | ICD-10-CM | POA: Diagnosis not present

## 2019-04-25 DIAGNOSIS — Z992 Dependence on renal dialysis: Secondary | ICD-10-CM | POA: Diagnosis not present

## 2019-04-25 DIAGNOSIS — N186 End stage renal disease: Secondary | ICD-10-CM | POA: Diagnosis not present

## 2019-04-25 DIAGNOSIS — R52 Pain, unspecified: Secondary | ICD-10-CM | POA: Diagnosis not present

## 2019-04-25 DIAGNOSIS — N2581 Secondary hyperparathyroidism of renal origin: Secondary | ICD-10-CM | POA: Diagnosis not present

## 2019-04-25 DIAGNOSIS — D509 Iron deficiency anemia, unspecified: Secondary | ICD-10-CM | POA: Diagnosis not present

## 2019-04-27 DIAGNOSIS — Z992 Dependence on renal dialysis: Secondary | ICD-10-CM | POA: Diagnosis not present

## 2019-04-27 DIAGNOSIS — N186 End stage renal disease: Secondary | ICD-10-CM | POA: Diagnosis not present

## 2019-04-27 DIAGNOSIS — N2581 Secondary hyperparathyroidism of renal origin: Secondary | ICD-10-CM | POA: Diagnosis not present

## 2019-04-27 DIAGNOSIS — R52 Pain, unspecified: Secondary | ICD-10-CM | POA: Diagnosis not present

## 2019-04-27 DIAGNOSIS — D509 Iron deficiency anemia, unspecified: Secondary | ICD-10-CM | POA: Diagnosis not present

## 2019-04-27 DIAGNOSIS — D689 Coagulation defect, unspecified: Secondary | ICD-10-CM | POA: Diagnosis not present

## 2019-04-27 DIAGNOSIS — E1129 Type 2 diabetes mellitus with other diabetic kidney complication: Secondary | ICD-10-CM | POA: Diagnosis not present

## 2019-04-29 DIAGNOSIS — D689 Coagulation defect, unspecified: Secondary | ICD-10-CM | POA: Diagnosis not present

## 2019-04-29 DIAGNOSIS — D509 Iron deficiency anemia, unspecified: Secondary | ICD-10-CM | POA: Diagnosis not present

## 2019-04-29 DIAGNOSIS — E1129 Type 2 diabetes mellitus with other diabetic kidney complication: Secondary | ICD-10-CM | POA: Diagnosis not present

## 2019-04-29 DIAGNOSIS — N186 End stage renal disease: Secondary | ICD-10-CM | POA: Diagnosis not present

## 2019-04-29 DIAGNOSIS — Z992 Dependence on renal dialysis: Secondary | ICD-10-CM | POA: Diagnosis not present

## 2019-04-29 DIAGNOSIS — N2581 Secondary hyperparathyroidism of renal origin: Secondary | ICD-10-CM | POA: Diagnosis not present

## 2019-04-29 DIAGNOSIS — R52 Pain, unspecified: Secondary | ICD-10-CM | POA: Diagnosis not present

## 2019-05-02 DIAGNOSIS — E1129 Type 2 diabetes mellitus with other diabetic kidney complication: Secondary | ICD-10-CM | POA: Diagnosis not present

## 2019-05-02 DIAGNOSIS — N2581 Secondary hyperparathyroidism of renal origin: Secondary | ICD-10-CM | POA: Diagnosis not present

## 2019-05-02 DIAGNOSIS — D509 Iron deficiency anemia, unspecified: Secondary | ICD-10-CM | POA: Diagnosis not present

## 2019-05-02 DIAGNOSIS — D689 Coagulation defect, unspecified: Secondary | ICD-10-CM | POA: Diagnosis not present

## 2019-05-02 DIAGNOSIS — N186 End stage renal disease: Secondary | ICD-10-CM | POA: Diagnosis not present

## 2019-05-02 DIAGNOSIS — R52 Pain, unspecified: Secondary | ICD-10-CM | POA: Diagnosis not present

## 2019-05-02 DIAGNOSIS — Z992 Dependence on renal dialysis: Secondary | ICD-10-CM | POA: Diagnosis not present

## 2019-05-04 DIAGNOSIS — D689 Coagulation defect, unspecified: Secondary | ICD-10-CM | POA: Diagnosis not present

## 2019-05-04 DIAGNOSIS — D509 Iron deficiency anemia, unspecified: Secondary | ICD-10-CM | POA: Diagnosis not present

## 2019-05-04 DIAGNOSIS — E1129 Type 2 diabetes mellitus with other diabetic kidney complication: Secondary | ICD-10-CM | POA: Diagnosis not present

## 2019-05-04 DIAGNOSIS — Z992 Dependence on renal dialysis: Secondary | ICD-10-CM | POA: Diagnosis not present

## 2019-05-04 DIAGNOSIS — R52 Pain, unspecified: Secondary | ICD-10-CM | POA: Diagnosis not present

## 2019-05-04 DIAGNOSIS — N2581 Secondary hyperparathyroidism of renal origin: Secondary | ICD-10-CM | POA: Diagnosis not present

## 2019-05-04 DIAGNOSIS — N186 End stage renal disease: Secondary | ICD-10-CM | POA: Diagnosis not present

## 2019-05-05 DIAGNOSIS — R922 Inconclusive mammogram: Secondary | ICD-10-CM | POA: Diagnosis not present

## 2019-05-06 DIAGNOSIS — Z992 Dependence on renal dialysis: Secondary | ICD-10-CM | POA: Diagnosis not present

## 2019-05-06 DIAGNOSIS — R52 Pain, unspecified: Secondary | ICD-10-CM | POA: Diagnosis not present

## 2019-05-06 DIAGNOSIS — E1129 Type 2 diabetes mellitus with other diabetic kidney complication: Secondary | ICD-10-CM | POA: Diagnosis not present

## 2019-05-06 DIAGNOSIS — N186 End stage renal disease: Secondary | ICD-10-CM | POA: Diagnosis not present

## 2019-05-06 DIAGNOSIS — D689 Coagulation defect, unspecified: Secondary | ICD-10-CM | POA: Diagnosis not present

## 2019-05-06 DIAGNOSIS — N2581 Secondary hyperparathyroidism of renal origin: Secondary | ICD-10-CM | POA: Diagnosis not present

## 2019-05-06 DIAGNOSIS — D509 Iron deficiency anemia, unspecified: Secondary | ICD-10-CM | POA: Diagnosis not present

## 2019-05-09 DIAGNOSIS — D509 Iron deficiency anemia, unspecified: Secondary | ICD-10-CM | POA: Diagnosis not present

## 2019-05-09 DIAGNOSIS — Z992 Dependence on renal dialysis: Secondary | ICD-10-CM | POA: Diagnosis not present

## 2019-05-09 DIAGNOSIS — D689 Coagulation defect, unspecified: Secondary | ICD-10-CM | POA: Diagnosis not present

## 2019-05-09 DIAGNOSIS — N186 End stage renal disease: Secondary | ICD-10-CM | POA: Diagnosis not present

## 2019-05-09 DIAGNOSIS — E1129 Type 2 diabetes mellitus with other diabetic kidney complication: Secondary | ICD-10-CM | POA: Diagnosis not present

## 2019-05-09 DIAGNOSIS — R52 Pain, unspecified: Secondary | ICD-10-CM | POA: Diagnosis not present

## 2019-05-09 DIAGNOSIS — N2581 Secondary hyperparathyroidism of renal origin: Secondary | ICD-10-CM | POA: Diagnosis not present

## 2019-05-11 DIAGNOSIS — N186 End stage renal disease: Secondary | ICD-10-CM | POA: Diagnosis not present

## 2019-05-11 DIAGNOSIS — Z992 Dependence on renal dialysis: Secondary | ICD-10-CM | POA: Diagnosis not present

## 2019-05-11 DIAGNOSIS — E1129 Type 2 diabetes mellitus with other diabetic kidney complication: Secondary | ICD-10-CM | POA: Diagnosis not present

## 2019-05-11 DIAGNOSIS — D689 Coagulation defect, unspecified: Secondary | ICD-10-CM | POA: Diagnosis not present

## 2019-05-11 DIAGNOSIS — R52 Pain, unspecified: Secondary | ICD-10-CM | POA: Diagnosis not present

## 2019-05-11 DIAGNOSIS — D509 Iron deficiency anemia, unspecified: Secondary | ICD-10-CM | POA: Diagnosis not present

## 2019-05-11 DIAGNOSIS — N2581 Secondary hyperparathyroidism of renal origin: Secondary | ICD-10-CM | POA: Diagnosis not present

## 2019-05-13 DIAGNOSIS — D689 Coagulation defect, unspecified: Secondary | ICD-10-CM | POA: Diagnosis not present

## 2019-05-13 DIAGNOSIS — E1129 Type 2 diabetes mellitus with other diabetic kidney complication: Secondary | ICD-10-CM | POA: Diagnosis not present

## 2019-05-13 DIAGNOSIS — D509 Iron deficiency anemia, unspecified: Secondary | ICD-10-CM | POA: Diagnosis not present

## 2019-05-13 DIAGNOSIS — N2581 Secondary hyperparathyroidism of renal origin: Secondary | ICD-10-CM | POA: Diagnosis not present

## 2019-05-13 DIAGNOSIS — R52 Pain, unspecified: Secondary | ICD-10-CM | POA: Diagnosis not present

## 2019-05-13 DIAGNOSIS — Z992 Dependence on renal dialysis: Secondary | ICD-10-CM | POA: Diagnosis not present

## 2019-05-13 DIAGNOSIS — N186 End stage renal disease: Secondary | ICD-10-CM | POA: Diagnosis not present

## 2019-05-16 DIAGNOSIS — D509 Iron deficiency anemia, unspecified: Secondary | ICD-10-CM | POA: Diagnosis not present

## 2019-05-16 DIAGNOSIS — R52 Pain, unspecified: Secondary | ICD-10-CM | POA: Diagnosis not present

## 2019-05-16 DIAGNOSIS — N186 End stage renal disease: Secondary | ICD-10-CM | POA: Diagnosis not present

## 2019-05-16 DIAGNOSIS — E1129 Type 2 diabetes mellitus with other diabetic kidney complication: Secondary | ICD-10-CM | POA: Diagnosis not present

## 2019-05-16 DIAGNOSIS — Z992 Dependence on renal dialysis: Secondary | ICD-10-CM | POA: Diagnosis not present

## 2019-05-16 DIAGNOSIS — D689 Coagulation defect, unspecified: Secondary | ICD-10-CM | POA: Diagnosis not present

## 2019-05-16 DIAGNOSIS — N2581 Secondary hyperparathyroidism of renal origin: Secondary | ICD-10-CM | POA: Diagnosis not present

## 2019-05-18 DIAGNOSIS — Z992 Dependence on renal dialysis: Secondary | ICD-10-CM | POA: Diagnosis not present

## 2019-05-18 DIAGNOSIS — R52 Pain, unspecified: Secondary | ICD-10-CM | POA: Diagnosis not present

## 2019-05-18 DIAGNOSIS — N186 End stage renal disease: Secondary | ICD-10-CM | POA: Diagnosis not present

## 2019-05-18 DIAGNOSIS — D509 Iron deficiency anemia, unspecified: Secondary | ICD-10-CM | POA: Diagnosis not present

## 2019-05-18 DIAGNOSIS — E1129 Type 2 diabetes mellitus with other diabetic kidney complication: Secondary | ICD-10-CM | POA: Diagnosis not present

## 2019-05-18 DIAGNOSIS — N2581 Secondary hyperparathyroidism of renal origin: Secondary | ICD-10-CM | POA: Diagnosis not present

## 2019-05-18 DIAGNOSIS — D689 Coagulation defect, unspecified: Secondary | ICD-10-CM | POA: Diagnosis not present

## 2019-05-20 DIAGNOSIS — E1129 Type 2 diabetes mellitus with other diabetic kidney complication: Secondary | ICD-10-CM | POA: Diagnosis not present

## 2019-05-20 DIAGNOSIS — N2581 Secondary hyperparathyroidism of renal origin: Secondary | ICD-10-CM | POA: Diagnosis not present

## 2019-05-20 DIAGNOSIS — N186 End stage renal disease: Secondary | ICD-10-CM | POA: Diagnosis not present

## 2019-05-20 DIAGNOSIS — D689 Coagulation defect, unspecified: Secondary | ICD-10-CM | POA: Diagnosis not present

## 2019-05-20 DIAGNOSIS — D509 Iron deficiency anemia, unspecified: Secondary | ICD-10-CM | POA: Diagnosis not present

## 2019-05-20 DIAGNOSIS — R52 Pain, unspecified: Secondary | ICD-10-CM | POA: Diagnosis not present

## 2019-05-20 DIAGNOSIS — Z992 Dependence on renal dialysis: Secondary | ICD-10-CM | POA: Diagnosis not present

## 2019-05-20 DIAGNOSIS — I129 Hypertensive chronic kidney disease with stage 1 through stage 4 chronic kidney disease, or unspecified chronic kidney disease: Secondary | ICD-10-CM | POA: Diagnosis not present

## 2019-05-23 DIAGNOSIS — D509 Iron deficiency anemia, unspecified: Secondary | ICD-10-CM | POA: Diagnosis not present

## 2019-05-23 DIAGNOSIS — N186 End stage renal disease: Secondary | ICD-10-CM | POA: Diagnosis not present

## 2019-05-23 DIAGNOSIS — D689 Coagulation defect, unspecified: Secondary | ICD-10-CM | POA: Diagnosis not present

## 2019-05-23 DIAGNOSIS — Z23 Encounter for immunization: Secondary | ICD-10-CM | POA: Diagnosis not present

## 2019-05-23 DIAGNOSIS — E1129 Type 2 diabetes mellitus with other diabetic kidney complication: Secondary | ICD-10-CM | POA: Diagnosis not present

## 2019-05-23 DIAGNOSIS — Z992 Dependence on renal dialysis: Secondary | ICD-10-CM | POA: Diagnosis not present

## 2019-05-23 DIAGNOSIS — N2581 Secondary hyperparathyroidism of renal origin: Secondary | ICD-10-CM | POA: Diagnosis not present

## 2019-05-24 ENCOUNTER — Ambulatory Visit (INDEPENDENT_AMBULATORY_CARE_PROVIDER_SITE_OTHER): Payer: Medicare Other | Admitting: Ophthalmology

## 2019-05-24 ENCOUNTER — Encounter (INDEPENDENT_AMBULATORY_CARE_PROVIDER_SITE_OTHER): Payer: Self-pay | Admitting: Ophthalmology

## 2019-05-24 DIAGNOSIS — H25813 Combined forms of age-related cataract, bilateral: Secondary | ICD-10-CM

## 2019-05-24 DIAGNOSIS — H43811 Vitreous degeneration, right eye: Secondary | ICD-10-CM | POA: Diagnosis not present

## 2019-05-24 DIAGNOSIS — I10 Essential (primary) hypertension: Secondary | ICD-10-CM

## 2019-05-24 DIAGNOSIS — H31001 Unspecified chorioretinal scars, right eye: Secondary | ICD-10-CM | POA: Diagnosis not present

## 2019-05-24 DIAGNOSIS — H3581 Retinal edema: Secondary | ICD-10-CM

## 2019-05-24 DIAGNOSIS — H2513 Age-related nuclear cataract, bilateral: Secondary | ICD-10-CM | POA: Diagnosis not present

## 2019-05-24 DIAGNOSIS — H4311 Vitreous hemorrhage, right eye: Secondary | ICD-10-CM | POA: Diagnosis not present

## 2019-05-24 DIAGNOSIS — H35033 Hypertensive retinopathy, bilateral: Secondary | ICD-10-CM

## 2019-05-24 DIAGNOSIS — H31091 Other chorioretinal scars, right eye: Secondary | ICD-10-CM | POA: Diagnosis not present

## 2019-05-24 NOTE — Progress Notes (Signed)
[facility] Note  05/24/2019     CHIEF COMPLAINT Patient presents for Retina Follow Up  HISTORY OF PRESENT ILLNESS: Chelsea Roberts is a 64 y.o. female who presents to the clinic today for:   HPI    Retina Follow Up    Patient presents with  PVD.  In right eye.  This started 1 week ago.  Severity is moderate.  Duration of weeks.  Since onset it is stable.  I, the attending physician,  performed the HPI with the patient and updated documentation appropriately.          Comments    Pt states her vision is stable and has no trouble seeing but complains of new floater OD since last Wednesday--Denies fol OD.  Patient denies eye pain or discomfort.  Patient denies any new or worsening floaters or fol OS.  Pt has Dialysis Tx M,W,Fri  Pt states shes "only diabetic when she has a gout flare-up"       Last edited by Bernarda Caffey, MD on 05/24/2019  1:22 PM. (History)    pt is here on the referral of Theodosia Quay, PA-C for concern of vitreous heme, pt states she has been seeing floaters for a week, and went to see Lundquist this morning, pt states this was her first visit with him, pt states she is on dialysis (since 09.25.20) for kidney failure caused by high blood pressure and chronic gout, pt denies flashes of light  Referring physician: Shirleen Schirmer, PA-C [address],  [place] [address]  HISTORICAL INFORMATION:   Selected notes from the [place]: No current outpatient medications on file. (Ophthalmic Drugs)   No current facility-administered medications for this visit. (Ophthalmic Drugs)   Current Outpatient Medications (Other)  Medication Sig  . amLODipine (NORVASC) 10 MG tablet Take 1 tablet (10 mg total) by mouth daily. (Patient taking differently: Take 10 mg by mouth at bedtime. )  . carvedilol (COREG) 25 MG tablet Take 25 mg by mouth 2 (two) times daily with a meal.   . Febuxostat (ULORIC)  80 MG TABS Take 1 tablet (80 mg total) by mouth daily.  . hydrALAZINE (APRESOLINE) 100 MG tablet Take 1 tablet (100 mg total) by mouth 3 (three) times daily. (Patient taking differently: Take 50 mg by mouth 2 (two) times daily. )  . HYDROcodone-acetaminophen (NORCO) 5-325 MG tablet Take 1 tablet by mouth every 6 (six) hours as needed for moderate pain.  Marland Kitchen LORazepam (ATIVAN) 1 MG tablet Take 1 tablet (1 mg total) by mouth at bedtime. (Patient taking differently: Take 1 mg by mouth 2 (two) times daily as needed for anxiety. )  . multivitamin (RENA-VIT) TABS tablet Take 1 tablet by mouth daily.  . predniSONE (DELTASONE) 20 MG tablet Take 20 mg by mouth as needed (Gout).   . sertraline (ZOLOFT) 100 MG tablet Take 1.5 tablets (150 mg total) by mouth daily. PATIENT NEEDS OFFICE VISIT FOR ADDITIONAL REFILLS (Patient taking differently: Take 100 mg by mouth every evening. )  . sevelamer carbonate (RENVELA) 800 MG tablet Take 1 tablet (800 mg total) by mouth 3 (three) times daily with meals. (Patient taking differently: Take 2,400 mg by mouth 3 (three) times daily with meals. )   No current facility-administered medications for this visit. (Other)      REVIEW OF SYSTEMS: ROS    Positive for: Eyes   Negative for: Constitutional, Gastrointestinal, Neurological,  Skin, Genitourinary, Musculoskeletal, HENT, Endocrine, Cardiovascular, Respiratory, Psychiatric, Allergic/Imm, Heme/Lymph   Last edited by Doneen Poisson on 05/24/2019 12:56 PM. (History)       ALLERGIES Allergies  Allergen Reactions  . Sulfa Antibiotics Swelling  . Tekturna [Aliskiren] Swelling  . Lisinopril Cough  . Clonidine Derivatives Other (See Comments)    Extreme dry mouth  . Diovan [Valsartan] Other (See Comments)    headache  . Hctz [Hydrochlorothiazide] Other (See Comments)    gout  . Norvasc [Amlodipine Besylate] Swelling    In legs and feet  . Procardia [Nifedipine] Swelling    In legs and feet    PAST MEDICAL  HISTORY Past Medical History:  Diagnosis Date  . Anxiety   . Arthritis   . Asthma    due to sesonal allergies  . Breast mass   . Chronic kidney disease    MWF  . Complication of anesthesia    severe htn-had to take 2 ativan preop  . Depression   . Diabetes mellitus without complication ([place])    during gout attack  . Gout   . Hyperlipidemia   . Hypertension   . Pre-diabetes   . Sleep apnea    NO CPAP use per pt., lost 70lbs  . Stroke [facility]) 2013   "several  small"   . Systolic CHF, chronic ([place]) 2013   Past Surgical History:  Procedure Laterality Date  . A/V FISTULAGRAM Left 12/13/2018   Procedure: A/V FISTULAGRAM;  Surgeon: Waynetta Sandy, MD;  Location: [place];  Service: Cardiovascular;  Laterality: Left;  . ABDOMINAL HYSTERECTOMY  1999   partial  . AV FISTULA PLACEMENT Left 09/01/2018   Procedure: CREATION OF ARTERIOVENOUS (AV) FISTULA LEFT ARM;  Surgeon: [name], MD;  Location: [facility];  Service: Vascular;  Laterality: Left;  . BREAST BIOPSY  01/01/2012   Procedure: Left BREAST BIOPSY WITH NEEDLE LOCALIZATION;  Surgeon: Haywood Lasso, MD;  Location: [place];  Service: General;  Laterality: Right;  Needle localization excision Right breast mass  . BREAST BIOPSY Right 2019  . BREAST LUMPECTOMY WITH RADIOACTIVE SEED LOCALIZATION Left 10/09/2017   Procedure: BREAST LUMPECTOMY WITH RADIOACTIVE SEED LOCALIZATION;  Surgeon: Jovita Kussmaul, MD;  Location: [place];  Service: General;  Laterality: Left;  . COLONOSCOPY  2019  . FISTULA SUPERFICIALIZATION Left 12/15/2018   Procedure: FISTULA SUPERFICIALIZATION LEFT ARM;  Surgeon: Waynetta Sandy, MD;  Location: [facility];  Service: Vascular;  Laterality: Left;  . IR FLUORO GUIDE CV LINE RIGHT  10/12/2018  . IR US GUIDE VASC ACCESS RIGHT  10/12/2018    FAMILY HISTORY Family History  Problem Relation Age of Onset  . Heart failure Sister   . Diabetes Sister   .  Hypertension Sister   . Kidney disease Sister   . Hypertension Sister   . Diabetes Sister   . Colon polyps Sister   . Hypertension Mother   . Hypertension Sister   . Cancer Father        lung cancer; smoker and asbestos exposure  . Diabetes Father   . Cancer Maternal Aunt        ovarian  . Cancer Maternal Grandmother        breast  . Colon cancer Maternal Grandmother 65  . Diabetes type II Sister     SOCIAL HISTORY Social History   Tobacco Use  . Smoking status: Never Smoker  . Smokeless tobacco: Never Used  Substance Use  Topics  . Alcohol use: No  . Drug use: No         OPHTHALMIC EXAM:  Base Eye Exam    Visual Acuity (Snellen - Linear)      Right Left   Dist [place] 20/30 -2 20/20 -2   Dist ph [place] 20/20 -2        Tonometry (Tonopen, 1:00 PM)      Right Left   Pressure 17 15       Pupils      Dark Light Shape React APD   Right 8 8 Round Dilated 0   Left 8 8 Round Dilated 0       Visual Fields      Left Right    Full Full       Extraocular Movement      Right Left    Full Full       Neuro/Psych    Oriented x3: Yes   Mood/Affect: Normal       Dilation    Both eyes: 1.0% Mydriacyl, 2.5% Phenylephrine @ 1:00 PM        Slit Lamp and Fundus Exam    Slit Lamp Exam      Right Left   Lids/Lashes Dermatochalasis - upper lid Dermatochalasis - upper lid   Conjunctiva/Sclera white and quiet, mild Melanosis white and quiet, mild Melanosis   Cornea trace Punctate epithelial erosions, midl Arcus trace Punctate epithelial erosions, midl Arcus   Anterior Chamber Deep and quiet Deep and quiet   Iris Round and dilated Round and dilated   Lens 2+ Nuclear sclerosis, 2-3+ Cortical cataract 2+ Nuclear sclerosis, 2-3+ Cortical cataract   Vitreous Vitreous syneresis, Posterior vitreous detachment, Weiss ring, vitreous condensations w/ mild blood staining and mild VH Vitreous syneresis       Fundus Exam      Right Left   Disc Pink and Sharp Pink and Sharp   C/D  Ratio 0.3 0.2   Macula Flat, Good foveal reflex, trace Epiretinal membrane, No heme or edema Flat, Good foveal reflex, trace Epiretinal membrane, No heme or edema   Vessels Vascular attenuation, Tortuous, mild Copper wiring Vascular attenuation, Tortuous   Periphery Attached, pigmented CR scar at 0730 equator, ?CHRPE with lacuna     Attached             IMAGING AND PROCEDURES  Imaging and Procedures for @TODAY @  OCT, Retina - OU - Both Eyes       Right Eye Quality was good. Central Foveal Thickness: 232. Progression has no prior data. Findings include normal foveal contour, no IRF, no SRF (Vitreous opacities; peripheral drusen caught on widefield).   Left Eye Quality was good. Central Foveal Thickness: 234. Progression has no prior data. Findings include normal foveal contour, no IRF, no SRF, vitreomacular adhesion .   Notes *Images captured and stored on drive  Diagnosis / Impression:  NFP, no IRF/SRF  Clinical management:  See below  Abbreviations: NFP - Normal foveal profile. CME - cystoid macular edema. PED - pigment epithelial detachment. IRF - intraretinal fluid. SRF - subretinal fluid. EZ - ellipsoid zone. ERM - epiretinal membrane. ORA - outer retinal atrophy. ORT - outer retinal tubulation. SRHM - subretinal hyper-reflective material        Color Fundus Photography Optos - OU - Both Eyes       Right Eye Progression has no prior data. Disc findings include normal observations. Macula : normal observations, flat. Vessels : attenuated, tortuous vessels.  Periphery : RPE abnormality.   Left Eye Progression has no prior data. Disc findings include normal observations. Macula : normal observations, flat. Vessels : tortuous vessels, attenuated. Periphery : normal observations.   Notes **Images stored on drive**  Impression: OD: focal pigmented CR scar -- ?CHRPE w/ lacunae                  ASSESSMENT/PLAN:    ICD-10-CM   1. Posterior vitreous  detachment of right eye  H43.811   2. Vitreous hemorrhage of right eye ([place])  H43.11   3. Retinal edema  H35.81 OCT, Retina - OU - Both Eyes  4. Essential hypertension  I10   5. Hypertensive retinopathy of both eyes  H35.033   6. Chorioretinal scar of right eye  H31.001 Color Fundus Photography Optos - OU - Both Eyes  7. Combined forms of age-related cataract of both eyes  H25.813     1,2. Hemorrhagic PVD OD  - Onset ~1 wk -- presented to Shirleen Schirmer, PA-C on 05.04.21  - Discussed findings and prognosis  - No RT or RD on 360 scleral depressed exam, just very mild VH  - Reviewed s/s of RT/RD  - Strict return precautions for any such RT/RD signs/symptoms  - VH precautions reviewed -- minimize activities, keep head elevated, avoid ASA/NSAIDs/blood thinners as able  - F/U 4-6 weeks, sooner prn  3. No retinal edema on exam or OCT  4,5. Hypertensive retinopathy OU  - discussed importance of tight BP control  - monitor  6. CR scar / CHRPE  7. Mixed cataracts OU  - The symptoms of cataract, surgical options, and treatments and risks were discussed with patient.  - discussed diagnosis and progression  - not yet visually significant  - monitor for now  Ophthalmic Meds Ordered this visit:  No orders of the defined types were placed in this encounter.      Return for f/u 4-6 weeks, hemorrhagic PVD OD, DFE, OCT.  There are no Patient Instructions on file for this visit.   Explained the diagnoses, plan, and follow up with the patient and they expressed understanding.  Patient expressed understanding of the importance of proper follow up care.   This document serves as a record of services personally performed by Gardiner Sleeper, MD, PhD. It was created on their behalf by Ernest Mallick, OA, an ophthalmic assistant. The creation of this record is the provider's dictation and/or activities during the visit.    Electronically signed by: Ernest Mallick, OA 05.04.2021 2:03 PM   Gardiner Sleeper, M.D., Ph.D. Diseases & Surgery of the Retina and Vitreous [facility]  I have reviewed the above documentation for accuracy and completeness, and I agree with the above. Gardiner Sleeper, M.D., Ph.D. 05/24/19 2:03 PM   Abbreviations: M myopia (nearsighted); A astigmatism; H hyperopia (farsighted); P presbyopia; Mrx spectacle prescription;  CTL contact lenses; OD right eye; OS left eye; OU both eyes  XT exotropia; ET esotropia; PEK punctate epithelial keratitis; PEE punctate epithelial erosions; DES dry eye syndrome; MGD meibomian gland dysfunction; ATs artificial tears; PFAT's preservative free artificial tears; [place] nuclear sclerotic cataract; PSC posterior subcapsular cataract; ERM epi-retinal membrane; PVD posterior vitreous detachment; RD retinal detachment; DM diabetes mellitus; DR diabetic retinopathy; NPDR non-proliferative diabetic retinopathy; PDR proliferative diabetic retinopathy; CSME clinically significant macular edema; DME diabetic macular edema; dbh dot blot hemorrhages; CWS cotton wool spot; POAG primary open angle glaucoma; C/D cup-to-disc ratio; HVF humphrey visual  field; GVF goldmann visual field; OCT optical coherence tomography; IOP intraocular pressure; BRVO Branch retinal vein occlusion; CRVO central retinal vein occlusion; CRAO central retinal artery occlusion; BRAO branch retinal artery occlusion; RT retinal tear; SB scleral buckle; PPV pars plana vitrectomy; VH Vitreous hemorrhage; PRP panretinal laser photocoagulation; IVK intravitreal kenalog; VMT vitreomacular traction; MH Macular hole;  NVD neovascularization of the disc; NVE neovascularization elsewhere; AREDS age related eye disease study; ARMD age related macular degeneration; POAG primary open angle glaucoma; EBMD epithelial/anterior basement membrane dystrophy; ACIOL anterior chamber intraocular lens; IOL intraocular lens; PCIOL posterior chamber intraocular lens; Phaco/IOL  phacoemulsification with intraocular lens placement; [place] photorefractive keratectomy; LASIK laser assisted in situ keratomileusis; HTN hypertension; DM diabetes mellitus; COPD chronic obstructive pulmonary disease

## 2019-05-25 DIAGNOSIS — Z23 Encounter for immunization: Secondary | ICD-10-CM | POA: Diagnosis not present

## 2019-05-25 DIAGNOSIS — D689 Coagulation defect, unspecified: Secondary | ICD-10-CM | POA: Diagnosis not present

## 2019-05-25 DIAGNOSIS — N186 End stage renal disease: Secondary | ICD-10-CM | POA: Diagnosis not present

## 2019-05-25 DIAGNOSIS — Z992 Dependence on renal dialysis: Secondary | ICD-10-CM | POA: Diagnosis not present

## 2019-05-25 DIAGNOSIS — D509 Iron deficiency anemia, unspecified: Secondary | ICD-10-CM | POA: Diagnosis not present

## 2019-05-25 DIAGNOSIS — E1129 Type 2 diabetes mellitus with other diabetic kidney complication: Secondary | ICD-10-CM | POA: Diagnosis not present

## 2019-05-25 DIAGNOSIS — N2581 Secondary hyperparathyroidism of renal origin: Secondary | ICD-10-CM | POA: Diagnosis not present

## 2019-05-27 DIAGNOSIS — E1129 Type 2 diabetes mellitus with other diabetic kidney complication: Secondary | ICD-10-CM | POA: Diagnosis not present

## 2019-05-27 DIAGNOSIS — Z992 Dependence on renal dialysis: Secondary | ICD-10-CM | POA: Diagnosis not present

## 2019-05-27 DIAGNOSIS — N186 End stage renal disease: Secondary | ICD-10-CM | POA: Diagnosis not present

## 2019-05-27 DIAGNOSIS — N2581 Secondary hyperparathyroidism of renal origin: Secondary | ICD-10-CM | POA: Diagnosis not present

## 2019-05-27 DIAGNOSIS — D689 Coagulation defect, unspecified: Secondary | ICD-10-CM | POA: Diagnosis not present

## 2019-05-27 DIAGNOSIS — D509 Iron deficiency anemia, unspecified: Secondary | ICD-10-CM | POA: Diagnosis not present

## 2019-05-27 DIAGNOSIS — Z23 Encounter for immunization: Secondary | ICD-10-CM | POA: Diagnosis not present

## 2019-05-30 DIAGNOSIS — D689 Coagulation defect, unspecified: Secondary | ICD-10-CM | POA: Diagnosis not present

## 2019-05-30 DIAGNOSIS — D509 Iron deficiency anemia, unspecified: Secondary | ICD-10-CM | POA: Diagnosis not present

## 2019-05-30 DIAGNOSIS — E1129 Type 2 diabetes mellitus with other diabetic kidney complication: Secondary | ICD-10-CM | POA: Diagnosis not present

## 2019-05-30 DIAGNOSIS — Z992 Dependence on renal dialysis: Secondary | ICD-10-CM | POA: Diagnosis not present

## 2019-05-30 DIAGNOSIS — Z23 Encounter for immunization: Secondary | ICD-10-CM | POA: Diagnosis not present

## 2019-05-30 DIAGNOSIS — N186 End stage renal disease: Secondary | ICD-10-CM | POA: Diagnosis not present

## 2019-05-30 DIAGNOSIS — N2581 Secondary hyperparathyroidism of renal origin: Secondary | ICD-10-CM | POA: Diagnosis not present

## 2019-06-01 DIAGNOSIS — D689 Coagulation defect, unspecified: Secondary | ICD-10-CM | POA: Diagnosis not present

## 2019-06-01 DIAGNOSIS — E1129 Type 2 diabetes mellitus with other diabetic kidney complication: Secondary | ICD-10-CM | POA: Diagnosis not present

## 2019-06-01 DIAGNOSIS — N2581 Secondary hyperparathyroidism of renal origin: Secondary | ICD-10-CM | POA: Diagnosis not present

## 2019-06-01 DIAGNOSIS — D509 Iron deficiency anemia, unspecified: Secondary | ICD-10-CM | POA: Diagnosis not present

## 2019-06-01 DIAGNOSIS — Z992 Dependence on renal dialysis: Secondary | ICD-10-CM | POA: Diagnosis not present

## 2019-06-01 DIAGNOSIS — N186 End stage renal disease: Secondary | ICD-10-CM | POA: Diagnosis not present

## 2019-06-01 DIAGNOSIS — Z23 Encounter for immunization: Secondary | ICD-10-CM | POA: Diagnosis not present

## 2019-06-03 DIAGNOSIS — D689 Coagulation defect, unspecified: Secondary | ICD-10-CM | POA: Diagnosis not present

## 2019-06-03 DIAGNOSIS — Z992 Dependence on renal dialysis: Secondary | ICD-10-CM | POA: Diagnosis not present

## 2019-06-03 DIAGNOSIS — E1129 Type 2 diabetes mellitus with other diabetic kidney complication: Secondary | ICD-10-CM | POA: Diagnosis not present

## 2019-06-03 DIAGNOSIS — D509 Iron deficiency anemia, unspecified: Secondary | ICD-10-CM | POA: Diagnosis not present

## 2019-06-03 DIAGNOSIS — N2581 Secondary hyperparathyroidism of renal origin: Secondary | ICD-10-CM | POA: Diagnosis not present

## 2019-06-03 DIAGNOSIS — N186 End stage renal disease: Secondary | ICD-10-CM | POA: Diagnosis not present

## 2019-06-03 DIAGNOSIS — Z23 Encounter for immunization: Secondary | ICD-10-CM | POA: Diagnosis not present

## 2019-06-06 DIAGNOSIS — D509 Iron deficiency anemia, unspecified: Secondary | ICD-10-CM | POA: Diagnosis not present

## 2019-06-06 DIAGNOSIS — E1129 Type 2 diabetes mellitus with other diabetic kidney complication: Secondary | ICD-10-CM | POA: Diagnosis not present

## 2019-06-06 DIAGNOSIS — Z23 Encounter for immunization: Secondary | ICD-10-CM | POA: Diagnosis not present

## 2019-06-06 DIAGNOSIS — N186 End stage renal disease: Secondary | ICD-10-CM | POA: Diagnosis not present

## 2019-06-06 DIAGNOSIS — N2581 Secondary hyperparathyroidism of renal origin: Secondary | ICD-10-CM | POA: Diagnosis not present

## 2019-06-06 DIAGNOSIS — Z992 Dependence on renal dialysis: Secondary | ICD-10-CM | POA: Diagnosis not present

## 2019-06-06 DIAGNOSIS — D689 Coagulation defect, unspecified: Secondary | ICD-10-CM | POA: Diagnosis not present

## 2019-06-08 DIAGNOSIS — Z23 Encounter for immunization: Secondary | ICD-10-CM | POA: Diagnosis not present

## 2019-06-08 DIAGNOSIS — Z992 Dependence on renal dialysis: Secondary | ICD-10-CM | POA: Diagnosis not present

## 2019-06-08 DIAGNOSIS — D509 Iron deficiency anemia, unspecified: Secondary | ICD-10-CM | POA: Diagnosis not present

## 2019-06-08 DIAGNOSIS — E1129 Type 2 diabetes mellitus with other diabetic kidney complication: Secondary | ICD-10-CM | POA: Diagnosis not present

## 2019-06-08 DIAGNOSIS — D689 Coagulation defect, unspecified: Secondary | ICD-10-CM | POA: Diagnosis not present

## 2019-06-08 DIAGNOSIS — N186 End stage renal disease: Secondary | ICD-10-CM | POA: Diagnosis not present

## 2019-06-08 DIAGNOSIS — N2581 Secondary hyperparathyroidism of renal origin: Secondary | ICD-10-CM | POA: Diagnosis not present

## 2019-06-09 DIAGNOSIS — H2513 Age-related nuclear cataract, bilateral: Secondary | ICD-10-CM | POA: Diagnosis not present

## 2019-06-09 DIAGNOSIS — H43811 Vitreous degeneration, right eye: Secondary | ICD-10-CM | POA: Diagnosis not present

## 2019-06-09 DIAGNOSIS — Q141 Congenital malformation of retina: Secondary | ICD-10-CM | POA: Diagnosis not present

## 2019-06-09 DIAGNOSIS — H52223 Regular astigmatism, bilateral: Secondary | ICD-10-CM | POA: Diagnosis not present

## 2019-06-09 DIAGNOSIS — H5213 Myopia, bilateral: Secondary | ICD-10-CM | POA: Diagnosis not present

## 2019-06-10 DIAGNOSIS — N2581 Secondary hyperparathyroidism of renal origin: Secondary | ICD-10-CM | POA: Diagnosis not present

## 2019-06-10 DIAGNOSIS — N186 End stage renal disease: Secondary | ICD-10-CM | POA: Diagnosis not present

## 2019-06-10 DIAGNOSIS — D689 Coagulation defect, unspecified: Secondary | ICD-10-CM | POA: Diagnosis not present

## 2019-06-10 DIAGNOSIS — Z992 Dependence on renal dialysis: Secondary | ICD-10-CM | POA: Diagnosis not present

## 2019-06-10 DIAGNOSIS — E1129 Type 2 diabetes mellitus with other diabetic kidney complication: Secondary | ICD-10-CM | POA: Diagnosis not present

## 2019-06-10 DIAGNOSIS — Z23 Encounter for immunization: Secondary | ICD-10-CM | POA: Diagnosis not present

## 2019-06-10 DIAGNOSIS — D509 Iron deficiency anemia, unspecified: Secondary | ICD-10-CM | POA: Diagnosis not present

## 2019-06-13 DIAGNOSIS — Z992 Dependence on renal dialysis: Secondary | ICD-10-CM | POA: Diagnosis not present

## 2019-06-13 DIAGNOSIS — D509 Iron deficiency anemia, unspecified: Secondary | ICD-10-CM | POA: Diagnosis not present

## 2019-06-13 DIAGNOSIS — N186 End stage renal disease: Secondary | ICD-10-CM | POA: Diagnosis not present

## 2019-06-13 DIAGNOSIS — E1129 Type 2 diabetes mellitus with other diabetic kidney complication: Secondary | ICD-10-CM | POA: Diagnosis not present

## 2019-06-13 DIAGNOSIS — D689 Coagulation defect, unspecified: Secondary | ICD-10-CM | POA: Diagnosis not present

## 2019-06-13 DIAGNOSIS — Z23 Encounter for immunization: Secondary | ICD-10-CM | POA: Diagnosis not present

## 2019-06-13 DIAGNOSIS — N2581 Secondary hyperparathyroidism of renal origin: Secondary | ICD-10-CM | POA: Diagnosis not present

## 2019-06-15 DIAGNOSIS — Z992 Dependence on renal dialysis: Secondary | ICD-10-CM | POA: Diagnosis not present

## 2019-06-15 DIAGNOSIS — E1129 Type 2 diabetes mellitus with other diabetic kidney complication: Secondary | ICD-10-CM | POA: Diagnosis not present

## 2019-06-15 DIAGNOSIS — D509 Iron deficiency anemia, unspecified: Secondary | ICD-10-CM | POA: Diagnosis not present

## 2019-06-15 DIAGNOSIS — Z23 Encounter for immunization: Secondary | ICD-10-CM | POA: Diagnosis not present

## 2019-06-15 DIAGNOSIS — N2581 Secondary hyperparathyroidism of renal origin: Secondary | ICD-10-CM | POA: Diagnosis not present

## 2019-06-15 DIAGNOSIS — N186 End stage renal disease: Secondary | ICD-10-CM | POA: Diagnosis not present

## 2019-06-15 DIAGNOSIS — D689 Coagulation defect, unspecified: Secondary | ICD-10-CM | POA: Diagnosis not present

## 2019-06-17 DIAGNOSIS — E1129 Type 2 diabetes mellitus with other diabetic kidney complication: Secondary | ICD-10-CM | POA: Diagnosis not present

## 2019-06-17 DIAGNOSIS — D689 Coagulation defect, unspecified: Secondary | ICD-10-CM | POA: Diagnosis not present

## 2019-06-17 DIAGNOSIS — Z23 Encounter for immunization: Secondary | ICD-10-CM | POA: Diagnosis not present

## 2019-06-17 DIAGNOSIS — N186 End stage renal disease: Secondary | ICD-10-CM | POA: Diagnosis not present

## 2019-06-17 DIAGNOSIS — Z992 Dependence on renal dialysis: Secondary | ICD-10-CM | POA: Diagnosis not present

## 2019-06-17 DIAGNOSIS — N2581 Secondary hyperparathyroidism of renal origin: Secondary | ICD-10-CM | POA: Diagnosis not present

## 2019-06-17 DIAGNOSIS — D509 Iron deficiency anemia, unspecified: Secondary | ICD-10-CM | POA: Diagnosis not present

## 2019-06-20 DIAGNOSIS — I129 Hypertensive chronic kidney disease with stage 1 through stage 4 chronic kidney disease, or unspecified chronic kidney disease: Secondary | ICD-10-CM | POA: Diagnosis not present

## 2019-06-20 DIAGNOSIS — N186 End stage renal disease: Secondary | ICD-10-CM | POA: Diagnosis not present

## 2019-06-20 DIAGNOSIS — D509 Iron deficiency anemia, unspecified: Secondary | ICD-10-CM | POA: Diagnosis not present

## 2019-06-20 DIAGNOSIS — Z992 Dependence on renal dialysis: Secondary | ICD-10-CM | POA: Diagnosis not present

## 2019-06-20 DIAGNOSIS — E1129 Type 2 diabetes mellitus with other diabetic kidney complication: Secondary | ICD-10-CM | POA: Diagnosis not present

## 2019-06-20 DIAGNOSIS — Z23 Encounter for immunization: Secondary | ICD-10-CM | POA: Diagnosis not present

## 2019-06-20 DIAGNOSIS — N2581 Secondary hyperparathyroidism of renal origin: Secondary | ICD-10-CM | POA: Diagnosis not present

## 2019-06-20 DIAGNOSIS — D689 Coagulation defect, unspecified: Secondary | ICD-10-CM | POA: Diagnosis not present

## 2019-06-22 DIAGNOSIS — N2581 Secondary hyperparathyroidism of renal origin: Secondary | ICD-10-CM | POA: Diagnosis not present

## 2019-06-22 DIAGNOSIS — R52 Pain, unspecified: Secondary | ICD-10-CM | POA: Diagnosis not present

## 2019-06-22 DIAGNOSIS — D631 Anemia in chronic kidney disease: Secondary | ICD-10-CM | POA: Diagnosis not present

## 2019-06-22 DIAGNOSIS — D509 Iron deficiency anemia, unspecified: Secondary | ICD-10-CM | POA: Diagnosis not present

## 2019-06-22 DIAGNOSIS — E1129 Type 2 diabetes mellitus with other diabetic kidney complication: Secondary | ICD-10-CM | POA: Diagnosis not present

## 2019-06-22 DIAGNOSIS — Z992 Dependence on renal dialysis: Secondary | ICD-10-CM | POA: Diagnosis not present

## 2019-06-22 DIAGNOSIS — N186 End stage renal disease: Secondary | ICD-10-CM | POA: Diagnosis not present

## 2019-06-22 DIAGNOSIS — D689 Coagulation defect, unspecified: Secondary | ICD-10-CM | POA: Diagnosis not present

## 2019-06-23 DIAGNOSIS — E785 Hyperlipidemia, unspecified: Secondary | ICD-10-CM | POA: Diagnosis not present

## 2019-06-23 DIAGNOSIS — E7800 Pure hypercholesterolemia, unspecified: Secondary | ICD-10-CM | POA: Diagnosis not present

## 2019-06-23 DIAGNOSIS — E1169 Type 2 diabetes mellitus with other specified complication: Secondary | ICD-10-CM | POA: Diagnosis not present

## 2019-06-23 DIAGNOSIS — I120 Hypertensive chronic kidney disease with stage 5 chronic kidney disease or end stage renal disease: Secondary | ICD-10-CM | POA: Diagnosis not present

## 2019-06-23 DIAGNOSIS — I10 Essential (primary) hypertension: Secondary | ICD-10-CM | POA: Diagnosis not present

## 2019-06-24 DIAGNOSIS — Z992 Dependence on renal dialysis: Secondary | ICD-10-CM | POA: Diagnosis not present

## 2019-06-24 DIAGNOSIS — D509 Iron deficiency anemia, unspecified: Secondary | ICD-10-CM | POA: Diagnosis not present

## 2019-06-24 DIAGNOSIS — D689 Coagulation defect, unspecified: Secondary | ICD-10-CM | POA: Diagnosis not present

## 2019-06-24 DIAGNOSIS — R52 Pain, unspecified: Secondary | ICD-10-CM | POA: Diagnosis not present

## 2019-06-24 DIAGNOSIS — N2581 Secondary hyperparathyroidism of renal origin: Secondary | ICD-10-CM | POA: Diagnosis not present

## 2019-06-24 DIAGNOSIS — E1129 Type 2 diabetes mellitus with other diabetic kidney complication: Secondary | ICD-10-CM | POA: Diagnosis not present

## 2019-06-24 DIAGNOSIS — D631 Anemia in chronic kidney disease: Secondary | ICD-10-CM | POA: Diagnosis not present

## 2019-06-24 DIAGNOSIS — N186 End stage renal disease: Secondary | ICD-10-CM | POA: Diagnosis not present

## 2019-06-27 DIAGNOSIS — Z992 Dependence on renal dialysis: Secondary | ICD-10-CM | POA: Diagnosis not present

## 2019-06-27 DIAGNOSIS — N186 End stage renal disease: Secondary | ICD-10-CM | POA: Diagnosis not present

## 2019-06-27 DIAGNOSIS — R52 Pain, unspecified: Secondary | ICD-10-CM | POA: Diagnosis not present

## 2019-06-27 DIAGNOSIS — D509 Iron deficiency anemia, unspecified: Secondary | ICD-10-CM | POA: Diagnosis not present

## 2019-06-27 DIAGNOSIS — D631 Anemia in chronic kidney disease: Secondary | ICD-10-CM | POA: Diagnosis not present

## 2019-06-27 DIAGNOSIS — D689 Coagulation defect, unspecified: Secondary | ICD-10-CM | POA: Diagnosis not present

## 2019-06-27 DIAGNOSIS — N2581 Secondary hyperparathyroidism of renal origin: Secondary | ICD-10-CM | POA: Diagnosis not present

## 2019-06-27 DIAGNOSIS — E1129 Type 2 diabetes mellitus with other diabetic kidney complication: Secondary | ICD-10-CM | POA: Diagnosis not present

## 2019-06-29 DIAGNOSIS — Z992 Dependence on renal dialysis: Secondary | ICD-10-CM | POA: Diagnosis not present

## 2019-06-29 DIAGNOSIS — D689 Coagulation defect, unspecified: Secondary | ICD-10-CM | POA: Diagnosis not present

## 2019-06-29 DIAGNOSIS — D509 Iron deficiency anemia, unspecified: Secondary | ICD-10-CM | POA: Diagnosis not present

## 2019-06-29 DIAGNOSIS — D631 Anemia in chronic kidney disease: Secondary | ICD-10-CM | POA: Diagnosis not present

## 2019-06-29 DIAGNOSIS — E1129 Type 2 diabetes mellitus with other diabetic kidney complication: Secondary | ICD-10-CM | POA: Diagnosis not present

## 2019-06-29 DIAGNOSIS — N186 End stage renal disease: Secondary | ICD-10-CM | POA: Diagnosis not present

## 2019-06-29 DIAGNOSIS — N2581 Secondary hyperparathyroidism of renal origin: Secondary | ICD-10-CM | POA: Diagnosis not present

## 2019-06-29 DIAGNOSIS — R52 Pain, unspecified: Secondary | ICD-10-CM | POA: Diagnosis not present

## 2019-07-01 DIAGNOSIS — Z992 Dependence on renal dialysis: Secondary | ICD-10-CM | POA: Diagnosis not present

## 2019-07-01 DIAGNOSIS — D689 Coagulation defect, unspecified: Secondary | ICD-10-CM | POA: Diagnosis not present

## 2019-07-01 DIAGNOSIS — D509 Iron deficiency anemia, unspecified: Secondary | ICD-10-CM | POA: Diagnosis not present

## 2019-07-01 DIAGNOSIS — N186 End stage renal disease: Secondary | ICD-10-CM | POA: Diagnosis not present

## 2019-07-01 DIAGNOSIS — N2581 Secondary hyperparathyroidism of renal origin: Secondary | ICD-10-CM | POA: Diagnosis not present

## 2019-07-01 DIAGNOSIS — D631 Anemia in chronic kidney disease: Secondary | ICD-10-CM | POA: Diagnosis not present

## 2019-07-01 DIAGNOSIS — R52 Pain, unspecified: Secondary | ICD-10-CM | POA: Diagnosis not present

## 2019-07-01 DIAGNOSIS — E1129 Type 2 diabetes mellitus with other diabetic kidney complication: Secondary | ICD-10-CM | POA: Diagnosis not present

## 2019-07-04 DIAGNOSIS — D509 Iron deficiency anemia, unspecified: Secondary | ICD-10-CM | POA: Diagnosis not present

## 2019-07-04 DIAGNOSIS — Z992 Dependence on renal dialysis: Secondary | ICD-10-CM | POA: Diagnosis not present

## 2019-07-04 DIAGNOSIS — N2581 Secondary hyperparathyroidism of renal origin: Secondary | ICD-10-CM | POA: Diagnosis not present

## 2019-07-04 DIAGNOSIS — N186 End stage renal disease: Secondary | ICD-10-CM | POA: Diagnosis not present

## 2019-07-04 DIAGNOSIS — E1129 Type 2 diabetes mellitus with other diabetic kidney complication: Secondary | ICD-10-CM | POA: Diagnosis not present

## 2019-07-04 DIAGNOSIS — D689 Coagulation defect, unspecified: Secondary | ICD-10-CM | POA: Diagnosis not present

## 2019-07-04 DIAGNOSIS — D631 Anemia in chronic kidney disease: Secondary | ICD-10-CM | POA: Diagnosis not present

## 2019-07-04 DIAGNOSIS — R52 Pain, unspecified: Secondary | ICD-10-CM | POA: Diagnosis not present

## 2019-07-06 DIAGNOSIS — R52 Pain, unspecified: Secondary | ICD-10-CM | POA: Diagnosis not present

## 2019-07-06 DIAGNOSIS — E1129 Type 2 diabetes mellitus with other diabetic kidney complication: Secondary | ICD-10-CM | POA: Diagnosis not present

## 2019-07-06 DIAGNOSIS — D631 Anemia in chronic kidney disease: Secondary | ICD-10-CM | POA: Diagnosis not present

## 2019-07-06 DIAGNOSIS — Z992 Dependence on renal dialysis: Secondary | ICD-10-CM | POA: Diagnosis not present

## 2019-07-06 DIAGNOSIS — N2581 Secondary hyperparathyroidism of renal origin: Secondary | ICD-10-CM | POA: Diagnosis not present

## 2019-07-06 DIAGNOSIS — D689 Coagulation defect, unspecified: Secondary | ICD-10-CM | POA: Diagnosis not present

## 2019-07-06 DIAGNOSIS — D509 Iron deficiency anemia, unspecified: Secondary | ICD-10-CM | POA: Diagnosis not present

## 2019-07-06 DIAGNOSIS — N186 End stage renal disease: Secondary | ICD-10-CM | POA: Diagnosis not present

## 2019-07-07 ENCOUNTER — Other Ambulatory Visit (HOSPITAL_COMMUNITY)
Admission: RE | Admit: 2019-07-07 | Discharge: 2019-07-07 | Disposition: A | Discharge status: Home / Self Care | Payer: Medicare Other | Source: Ambulatory Visit | Attending: Physician Assistant | Admitting: Physician Assistant

## 2019-07-07 DIAGNOSIS — E1169 Type 2 diabetes mellitus with other specified complication: Secondary | ICD-10-CM | POA: Diagnosis not present

## 2019-07-07 DIAGNOSIS — Z1151 Encounter for screening for human papillomavirus (HPV): Secondary | ICD-10-CM | POA: Insufficient documentation

## 2019-07-07 DIAGNOSIS — R1900 Intra-abdominal and pelvic swelling, mass and lump, unspecified site: Secondary | ICD-10-CM | POA: Diagnosis not present

## 2019-07-07 DIAGNOSIS — Z124 Encounter for screening for malignant neoplasm of cervix: Secondary | ICD-10-CM | POA: Diagnosis present

## 2019-07-08 DIAGNOSIS — R52 Pain, unspecified: Secondary | ICD-10-CM | POA: Diagnosis not present

## 2019-07-08 DIAGNOSIS — D509 Iron deficiency anemia, unspecified: Secondary | ICD-10-CM | POA: Diagnosis not present

## 2019-07-08 DIAGNOSIS — N186 End stage renal disease: Secondary | ICD-10-CM | POA: Diagnosis not present

## 2019-07-08 DIAGNOSIS — Z992 Dependence on renal dialysis: Secondary | ICD-10-CM | POA: Diagnosis not present

## 2019-07-08 DIAGNOSIS — D689 Coagulation defect, unspecified: Secondary | ICD-10-CM | POA: Diagnosis not present

## 2019-07-08 DIAGNOSIS — D631 Anemia in chronic kidney disease: Secondary | ICD-10-CM | POA: Diagnosis not present

## 2019-07-08 DIAGNOSIS — E1129 Type 2 diabetes mellitus with other diabetic kidney complication: Secondary | ICD-10-CM | POA: Diagnosis not present

## 2019-07-08 DIAGNOSIS — N2581 Secondary hyperparathyroidism of renal origin: Secondary | ICD-10-CM | POA: Diagnosis not present

## 2019-07-11 DIAGNOSIS — R52 Pain, unspecified: Secondary | ICD-10-CM | POA: Diagnosis not present

## 2019-07-11 DIAGNOSIS — N186 End stage renal disease: Secondary | ICD-10-CM | POA: Diagnosis not present

## 2019-07-11 DIAGNOSIS — D509 Iron deficiency anemia, unspecified: Secondary | ICD-10-CM | POA: Diagnosis not present

## 2019-07-11 DIAGNOSIS — N2581 Secondary hyperparathyroidism of renal origin: Secondary | ICD-10-CM | POA: Diagnosis not present

## 2019-07-11 DIAGNOSIS — D631 Anemia in chronic kidney disease: Secondary | ICD-10-CM | POA: Diagnosis not present

## 2019-07-11 DIAGNOSIS — Z992 Dependence on renal dialysis: Secondary | ICD-10-CM | POA: Diagnosis not present

## 2019-07-11 DIAGNOSIS — D689 Coagulation defect, unspecified: Secondary | ICD-10-CM | POA: Diagnosis not present

## 2019-07-11 DIAGNOSIS — E1129 Type 2 diabetes mellitus with other diabetic kidney complication: Secondary | ICD-10-CM | POA: Diagnosis not present

## 2019-07-11 NOTE — Progress Notes (Signed)
[facility] Note  07/12/2019     CHIEF COMPLAINT Patient presents for Retina Follow Up  HISTORY OF PRESENT ILLNESS: Chelsea Roberts is a 64 y.o. female who presents to the clinic today for:   HPI    Retina Follow Up    Patient presents with  PVD.  In right eye.  This started 7 weeks ago.  Severity is moderate.  I, the attending physician,  performed the HPI with the patient and updated documentation appropriately.          Comments    Patient here for 7 weeks retina follow up for hemorrhagic PVD OD. Patient states vision doing better. No eye pain.        Last edited by Bernarda Caffey, MD on 07/15/2019  2:43 PM. (History)    pt states she still has one remaining floater in her right eye  Referring physician: Shirleen Schirmer, PA-C [address] [place],  [place] [address]  HISTORICAL INFORMATION:   Selected notes from the [place]: No current outpatient medications on file. (Ophthalmic Drugs)   No current facility-administered medications for this visit. (Ophthalmic Drugs)   Current Outpatient Medications (Other)  Medication Sig  . amLODipine (NORVASC) 10 MG tablet Take 1 tablet (10 mg total) by mouth daily. (Patient taking differently: Take 10 mg by mouth at bedtime. )  . carvedilol (COREG) 25 MG tablet Take 25 mg by mouth 2 (two) times daily with a meal.   . Febuxostat (ULORIC) 80 MG TABS Take 1 tablet (80 mg total) by mouth daily.  . hydrALAZINE (APRESOLINE) 100 MG tablet Take 1 tablet (100 mg total) by mouth 3 (three) times daily. (Patient taking differently: Take 50 mg by mouth 2 (two) times daily. )  . HYDROcodone-acetaminophen (NORCO) 5-325 MG tablet Take 1 tablet by mouth every 6 (six) hours as needed for moderate pain.  Marland Kitchen LORazepam (ATIVAN) 1 MG tablet Take 1 tablet (1 mg total) by mouth at bedtime. (Patient taking differently: Take 1 mg by mouth 2 (two) times daily as needed for anxiety. )  .  multivitamin (RENA-VIT) TABS tablet Take 1 tablet by mouth daily.  . predniSONE (DELTASONE) 20 MG tablet Take 20 mg by mouth as needed (Gout).   . sertraline (ZOLOFT) 100 MG tablet Take 1.5 tablets (150 mg total) by mouth daily. PATIENT NEEDS OFFICE VISIT FOR ADDITIONAL REFILLS (Patient taking differently: Take 100 mg by mouth every evening. )  . sevelamer carbonate (RENVELA) 800 MG tablet Take 1 tablet (800 mg total) by mouth 3 (three) times daily with meals. (Patient taking differently: Take 2,400 mg by mouth 3 (three) times daily with meals. )   No current facility-administered medications for this visit. (Other)      REVIEW OF SYSTEMS: ROS    Positive for: Eyes   Negative for: Constitutional, Gastrointestinal, Neurological, Skin, Genitourinary, Musculoskeletal, HENT, Endocrine, Cardiovascular, Respiratory, Psychiatric, Allergic/Imm, Heme/Lymph   Last edited by Theodore Demark, COA on 07/12/2019  9:23 AM. (History)       ALLERGIES Allergies  Allergen Reactions  . Sulfa Antibiotics Swelling  . Tekturna [Aliskiren] Swelling  . Lisinopril Cough  . Clonidine Derivatives Other (See Comments)    Extreme dry mouth  . Diovan [Valsartan] Other (See Comments)    headache  . Hctz [Hydrochlorothiazide] Other (See Comments)    gout  . Norvasc [Amlodipine Besylate] Swelling    In legs and feet  .  Procardia [Nifedipine] Swelling    In legs and feet    PAST MEDICAL HISTORY Past Medical History:  Diagnosis Date  . Anxiety   . Arthritis   . Asthma    due to sesonal allergies  . Breast mass   . Chronic kidney disease    MWF  . Complication of anesthesia    severe htn-had to take 2 ativan preop  . Depression   . Diabetes mellitus without complication ([place])    during gout attack  . Gout   . Hyperlipidemia   . Hypertension   . Pre-diabetes   . Sleep apnea    NO CPAP use per pt., lost 70lbs  . Stroke [facility]) 2013   "several  small"   . Systolic CHF, chronic ([place]) 2013   Past  Surgical History:  Procedure Laterality Date  . A/V FISTULAGRAM Left 12/13/2018   Procedure: A/V FISTULAGRAM;  Surgeon: Waynetta Sandy, MD;  Location: [facility];  Service: Cardiovascular;  Laterality: Left;  . ABDOMINAL HYSTERECTOMY  1999   partial  . AV FISTULA PLACEMENT Left 09/01/2018   Procedure: CREATION OF ARTERIOVENOUS (AV) FISTULA LEFT ARM;  Surgeon: [name], MD;  Location: [facility];  Service: Vascular;  Laterality: Left;  . BREAST BIOPSY  01/01/2012   Procedure: Left BREAST BIOPSY WITH NEEDLE LOCALIZATION;  Surgeon: Haywood Lasso, MD;  Location: [facility];  Service: General;  Laterality: Right;  Needle localization excision Right breast mass  . BREAST BIOPSY Right 2019  . BREAST LUMPECTOMY WITH RADIOACTIVE SEED LOCALIZATION Left 10/09/2017   Procedure: BREAST LUMPECTOMY WITH RADIOACTIVE SEED LOCALIZATION;  Surgeon: Jovita Kussmaul, MD;  Location: [facility];  Service: General;  Laterality: Left;  . COLONOSCOPY  2019  . FISTULA SUPERFICIALIZATION Left 12/15/2018   Procedure: FISTULA SUPERFICIALIZATION LEFT ARM;  Surgeon: Waynetta Sandy, MD;  Location: [facility];  Service: Vascular;  Laterality: Left;  . IR FLUORO GUIDE CV LINE RIGHT  10/12/2018  . IR US GUIDE VASC ACCESS RIGHT  10/12/2018    FAMILY HISTORY Family History  Problem Relation Age of Onset  . Heart failure Sister   . Diabetes Sister   . Hypertension Sister   . Kidney disease Sister   . Hypertension Sister   . Diabetes Sister   . Colon polyps Sister   . Hypertension Mother   . Hypertension Sister   . Cancer Father        lung cancer; smoker and asbestos exposure  . Diabetes Father   . Cancer Maternal Aunt        ovarian  . Cancer Maternal Grandmother        breast  . Colon cancer Maternal Grandmother 66  . Diabetes type II Sister     SOCIAL HISTORY Social History   Tobacco Use  . Smoking status: Never Smoker  . Smokeless tobacco: Never Used   Vaping Use  . Vaping Use: Never used  Substance Use Topics  . Alcohol use: No  . Drug use: No         OPHTHALMIC EXAM:  Base Eye Exam    Visual Acuity (Snellen - Linear)      Right Left   Dist [place] 20/25 -1 20/20 -1   Dist ph [place] 20/20 -2        Tonometry (Tonopen, 9:19 AM)      Right Left   Pressure 13 11       Pupils  Dark Light Shape React APD   Right 4 3 Round Brisk None   Left 4 3 Round Brisk None       Visual Fields (Counting fingers)      Left Right    Full Full       Extraocular Movement      Right Left    Full, Ortho Full, Ortho       Neuro/Psych    Oriented x3: Yes   Mood/Affect: Normal       Dilation    Both eyes: 1.0% Mydriacyl, 2.5% Phenylephrine @ 9:19 AM        Slit Lamp and Fundus Exam    Slit Lamp Exam      Right Left   Lids/Lashes Dermatochalasis - upper lid Dermatochalasis - upper lid   Conjunctiva/Sclera white and quiet, mild Melanosis white and quiet, mild Melanosis   Cornea trace Punctate epithelial erosions, midl Arcus trace Punctate epithelial erosions, midl Arcus   Anterior Chamber Deep and quiet Deep and quiet   Iris Round and dilated Round and dilated   Lens 2+ Nuclear sclerosis, 2-3+ Cortical cataract 2+ Nuclear sclerosis, 2-3+ Cortical cataract   Vitreous Vitreous syneresis, Posterior vitreous detachment and Weiss ring persistent, vitreous condensations w/ mild blood staining and mild VH - resolved Vitreous syneresis       Fundus Exam      Right Left   Disc Pink and Sharp Pink and Sharp   C/D Ratio 0.3 0.2   Macula Flat, Good foveal reflex, trace Epiretinal membrane, No heme or edema Flat, Good foveal reflex, trace Epiretinal membrane, No heme or edema   Vessels Vascular attenuation, Tortuous, mild Copper wiring Vascular attenuation, Tortuous   Periphery Attached, pigmented CR scar at 0730 equator, ?CHRPE with lacuna - stable Attached             IMAGING AND PROCEDURES  Imaging and Procedures for  @TODAY @  OCT, Retina - OU - Both Eyes       Right Eye Quality was good. Central Foveal Thickness: 238. Progression has improved. Findings include normal foveal contour, no IRF, no SRF (Interval improvement in Vitreous opacities; peripheral drusen caught on widefield).   Left Eye Quality was good. Central Foveal Thickness: 240. Progression has been stable. Findings include normal foveal contour, no IRF, no SRF, vitreomacular adhesion .   Notes *Images captured and stored on drive  Diagnosis / Impression:  NFP, no IRF/SRF  Clinical management:  See below  Abbreviations: NFP - Normal foveal profile. CME - cystoid macular edema. PED - pigment epithelial detachment. IRF - intraretinal fluid. SRF - subretinal fluid. EZ - ellipsoid zone. ERM - epiretinal membrane. ORA - outer retinal atrophy. ORT - outer retinal tubulation. SRHM - subretinal hyper-reflective material                 ASSESSMENT/PLAN:    ICD-10-CM   1. Posterior vitreous detachment of right eye  H43.811   2. Vitreous hemorrhage of right eye ([place])  H43.11   3. Retinal edema  H35.81 OCT, Retina - OU - Both Eyes  4. Essential hypertension  I10   5. Hypertensive retinopathy of both eyes  H35.033   6. Chorioretinal scar of right eye  H31.001   7. Combined forms of age-related cataract of both eyes  H25.813     1,2. Hemorrhagic PVD OD  - presented to Shirleen Schirmer, PA-C on 05.04.21  - Discussed findings and prognosis  - No RT or RD on 360 scleral  depressed exam, just very mild VH -- now cleared  - Reviewed s/s of RT/RD  - Strict return precautions for any such RT/RD signs/symptoms  - pt is cleared from a retina standpoint for release to [employer], PA-C and resumption of primary eye care  3. No retinal edema on exam or OCT  4,5. Hypertensive retinopathy OU  - discussed importance of tight BP control  - monitor  6. CR scar / CHRPE OD  - stable  7. Mixed cataracts OU  - The symptoms of cataract,  surgical options, and treatments and risks were discussed with patient.  - discussed diagnosis and progression  - not yet visually significant  - monitor for now  Ophthalmic Meds Ordered this visit:  No orders of the defined types were placed in this encounter.      Return if symptoms worsen or fail to improve.  There are no Patient Instructions on file for this visit.   Explained the diagnoses, plan, and follow up with the patient and they expressed understanding.  Patient expressed understanding of the importance of proper follow up care.   This document serves as a record of services personally performed by Gardiner Sleeper, MD, PhD. It was created on their behalf by Estill Bakes, COT an ophthalmic technician. The creation of this record is the provider's dictation and/or activities during the visit.    Electronically signed by: Estill Bakes, COT 07/11/19 @ 2:47 PM  Gardiner Sleeper, M.D., Ph.D. Diseases & Surgery of the Retina and [place] 07/12/2019   I have reviewed the above documentation for accuracy and completeness, and I agree with the above. Gardiner Sleeper, M.D., Ph.D. 07/15/19 2:47 PM   Abbreviations: M myopia (nearsighted); A astigmatism; H hyperopia (farsighted); P presbyopia; Mrx spectacle prescription;  CTL contact lenses; OD right eye; OS left eye; OU both eyes  XT exotropia; ET esotropia; PEK punctate epithelial keratitis; PEE punctate epithelial erosions; DES dry eye syndrome; MGD meibomian gland dysfunction; ATs artificial tears; PFAT's preservative free artificial tears; [place] nuclear sclerotic cataract; PSC posterior subcapsular cataract; ERM epi-retinal membrane; PVD posterior vitreous detachment; RD retinal detachment; DM diabetes mellitus; DR diabetic retinopathy; NPDR non-proliferative diabetic retinopathy; PDR proliferative diabetic retinopathy; CSME clinically significant macular edema; DME diabetic macular edema; dbh dot  blot hemorrhages; CWS cotton wool spot; POAG primary open angle glaucoma; C/D cup-to-disc ratio; HVF humphrey visual field; GVF goldmann visual field; OCT optical coherence tomography; IOP intraocular pressure; BRVO Branch retinal vein occlusion; CRVO central retinal vein occlusion; CRAO central retinal artery occlusion; BRAO branch retinal artery occlusion; RT retinal tear; SB scleral buckle; PPV pars plana vitrectomy; VH Vitreous hemorrhage; PRP panretinal laser photocoagulation; IVK intravitreal kenalog; VMT vitreomacular traction; MH Macular hole;  NVD neovascularization of the disc; NVE neovascularization elsewhere; AREDS age related eye disease study; ARMD age related macular degeneration; POAG primary open angle glaucoma; EBMD epithelial/anterior basement membrane dystrophy; ACIOL anterior chamber intraocular lens; IOL intraocular lens; PCIOL posterior chamber intraocular lens; Phaco/IOL phacoemulsification with intraocular lens placement; [place] photorefractive keratectomy; LASIK laser assisted in situ keratomileusis; HTN hypertension; DM diabetes mellitus; COPD chronic obstructive pulmonary disease

## 2019-07-12 ENCOUNTER — Encounter (INDEPENDENT_AMBULATORY_CARE_PROVIDER_SITE_OTHER): Payer: Self-pay | Admitting: Ophthalmology

## 2019-07-12 ENCOUNTER — Encounter (INDEPENDENT_AMBULATORY_CARE_PROVIDER_SITE_OTHER): Payer: Medicare Other | Admitting: Ophthalmology

## 2019-07-12 ENCOUNTER — Other Ambulatory Visit: Payer: Self-pay

## 2019-07-12 ENCOUNTER — Ambulatory Visit (INDEPENDENT_AMBULATORY_CARE_PROVIDER_SITE_OTHER): Payer: Medicare Other | Admitting: Ophthalmology

## 2019-07-12 DIAGNOSIS — H35033 Hypertensive retinopathy, bilateral: Secondary | ICD-10-CM | POA: Diagnosis not present

## 2019-07-12 DIAGNOSIS — I10 Essential (primary) hypertension: Secondary | ICD-10-CM

## 2019-07-12 DIAGNOSIS — H3581 Retinal edema: Secondary | ICD-10-CM | POA: Diagnosis not present

## 2019-07-12 DIAGNOSIS — H43811 Vitreous degeneration, right eye: Secondary | ICD-10-CM

## 2019-07-12 DIAGNOSIS — H25813 Combined forms of age-related cataract, bilateral: Secondary | ICD-10-CM

## 2019-07-12 DIAGNOSIS — H31001 Unspecified chorioretinal scars, right eye: Secondary | ICD-10-CM

## 2019-07-12 DIAGNOSIS — H4311 Vitreous hemorrhage, right eye: Secondary | ICD-10-CM

## 2019-07-12 LAB — CYTOLOGY - PAP
Adequacy: ABSENT
Chlamydia: NEGATIVE
Comment: NEGATIVE
Comment: NEGATIVE
Comment: NEGATIVE
Comment: NEGATIVE
Comment: NORMAL
Diagnosis: NEGATIVE
HSV1: NEGATIVE
HSV2: NEGATIVE
High risk HPV: NEGATIVE
Neisseria Gonorrhea: NEGATIVE
Trichomonas: NEGATIVE

## 2019-07-13 DIAGNOSIS — N2581 Secondary hyperparathyroidism of renal origin: Secondary | ICD-10-CM | POA: Diagnosis not present

## 2019-07-13 DIAGNOSIS — R52 Pain, unspecified: Secondary | ICD-10-CM | POA: Diagnosis not present

## 2019-07-13 DIAGNOSIS — D509 Iron deficiency anemia, unspecified: Secondary | ICD-10-CM | POA: Diagnosis not present

## 2019-07-13 DIAGNOSIS — N186 End stage renal disease: Secondary | ICD-10-CM | POA: Diagnosis not present

## 2019-07-13 DIAGNOSIS — Z992 Dependence on renal dialysis: Secondary | ICD-10-CM | POA: Diagnosis not present

## 2019-07-13 DIAGNOSIS — D631 Anemia in chronic kidney disease: Secondary | ICD-10-CM | POA: Diagnosis not present

## 2019-07-13 DIAGNOSIS — D689 Coagulation defect, unspecified: Secondary | ICD-10-CM | POA: Diagnosis not present

## 2019-07-13 DIAGNOSIS — E1129 Type 2 diabetes mellitus with other diabetic kidney complication: Secondary | ICD-10-CM | POA: Diagnosis not present

## 2019-07-15 ENCOUNTER — Encounter (INDEPENDENT_AMBULATORY_CARE_PROVIDER_SITE_OTHER): Payer: Self-pay | Admitting: Ophthalmology

## 2019-07-15 DIAGNOSIS — N2581 Secondary hyperparathyroidism of renal origin: Secondary | ICD-10-CM | POA: Diagnosis not present

## 2019-07-15 DIAGNOSIS — D631 Anemia in chronic kidney disease: Secondary | ICD-10-CM | POA: Diagnosis not present

## 2019-07-15 DIAGNOSIS — Z992 Dependence on renal dialysis: Secondary | ICD-10-CM | POA: Diagnosis not present

## 2019-07-15 DIAGNOSIS — R52 Pain, unspecified: Secondary | ICD-10-CM | POA: Diagnosis not present

## 2019-07-15 DIAGNOSIS — N186 End stage renal disease: Secondary | ICD-10-CM | POA: Diagnosis not present

## 2019-07-15 DIAGNOSIS — E1129 Type 2 diabetes mellitus with other diabetic kidney complication: Secondary | ICD-10-CM | POA: Diagnosis not present

## 2019-07-15 DIAGNOSIS — D509 Iron deficiency anemia, unspecified: Secondary | ICD-10-CM | POA: Diagnosis not present

## 2019-07-15 DIAGNOSIS — D689 Coagulation defect, unspecified: Secondary | ICD-10-CM | POA: Diagnosis not present

## 2019-07-16 DIAGNOSIS — D689 Coagulation defect, unspecified: Secondary | ICD-10-CM | POA: Diagnosis not present

## 2019-07-16 DIAGNOSIS — E1129 Type 2 diabetes mellitus with other diabetic kidney complication: Secondary | ICD-10-CM | POA: Diagnosis not present

## 2019-07-16 DIAGNOSIS — D509 Iron deficiency anemia, unspecified: Secondary | ICD-10-CM | POA: Diagnosis not present

## 2019-07-16 DIAGNOSIS — R52 Pain, unspecified: Secondary | ICD-10-CM | POA: Diagnosis not present

## 2019-07-16 DIAGNOSIS — N2581 Secondary hyperparathyroidism of renal origin: Secondary | ICD-10-CM | POA: Diagnosis not present

## 2019-07-16 DIAGNOSIS — N186 End stage renal disease: Secondary | ICD-10-CM | POA: Diagnosis not present

## 2019-07-16 DIAGNOSIS — D631 Anemia in chronic kidney disease: Secondary | ICD-10-CM | POA: Diagnosis not present

## 2019-07-16 DIAGNOSIS — Z992 Dependence on renal dialysis: Secondary | ICD-10-CM | POA: Diagnosis not present

## 2019-07-20 DIAGNOSIS — Z992 Dependence on renal dialysis: Secondary | ICD-10-CM | POA: Diagnosis not present

## 2019-07-20 DIAGNOSIS — N2581 Secondary hyperparathyroidism of renal origin: Secondary | ICD-10-CM | POA: Diagnosis not present

## 2019-07-20 DIAGNOSIS — N186 End stage renal disease: Secondary | ICD-10-CM | POA: Diagnosis not present

## 2019-07-20 DIAGNOSIS — E1129 Type 2 diabetes mellitus with other diabetic kidney complication: Secondary | ICD-10-CM | POA: Diagnosis not present

## 2019-07-20 DIAGNOSIS — D631 Anemia in chronic kidney disease: Secondary | ICD-10-CM | POA: Diagnosis not present

## 2019-07-20 DIAGNOSIS — D689 Coagulation defect, unspecified: Secondary | ICD-10-CM | POA: Diagnosis not present

## 2019-07-20 DIAGNOSIS — I129 Hypertensive chronic kidney disease with stage 1 through stage 4 chronic kidney disease, or unspecified chronic kidney disease: Secondary | ICD-10-CM | POA: Diagnosis not present

## 2019-07-20 DIAGNOSIS — R52 Pain, unspecified: Secondary | ICD-10-CM | POA: Diagnosis not present

## 2019-07-20 DIAGNOSIS — D509 Iron deficiency anemia, unspecified: Secondary | ICD-10-CM | POA: Diagnosis not present

## 2019-07-22 DIAGNOSIS — E1129 Type 2 diabetes mellitus with other diabetic kidney complication: Secondary | ICD-10-CM | POA: Diagnosis not present

## 2019-07-22 DIAGNOSIS — Z992 Dependence on renal dialysis: Secondary | ICD-10-CM | POA: Diagnosis not present

## 2019-07-22 DIAGNOSIS — N186 End stage renal disease: Secondary | ICD-10-CM | POA: Diagnosis not present

## 2019-07-22 DIAGNOSIS — D509 Iron deficiency anemia, unspecified: Secondary | ICD-10-CM | POA: Diagnosis not present

## 2019-07-22 DIAGNOSIS — D689 Coagulation defect, unspecified: Secondary | ICD-10-CM | POA: Diagnosis not present

## 2019-07-22 DIAGNOSIS — N2581 Secondary hyperparathyroidism of renal origin: Secondary | ICD-10-CM | POA: Diagnosis not present

## 2019-07-25 DIAGNOSIS — Z992 Dependence on renal dialysis: Secondary | ICD-10-CM | POA: Diagnosis not present

## 2019-07-25 DIAGNOSIS — N2581 Secondary hyperparathyroidism of renal origin: Secondary | ICD-10-CM | POA: Diagnosis not present

## 2019-07-25 DIAGNOSIS — D509 Iron deficiency anemia, unspecified: Secondary | ICD-10-CM | POA: Diagnosis not present

## 2019-07-25 DIAGNOSIS — N186 End stage renal disease: Secondary | ICD-10-CM | POA: Diagnosis not present

## 2019-07-25 DIAGNOSIS — E1129 Type 2 diabetes mellitus with other diabetic kidney complication: Secondary | ICD-10-CM | POA: Diagnosis not present

## 2019-07-25 DIAGNOSIS — D689 Coagulation defect, unspecified: Secondary | ICD-10-CM | POA: Diagnosis not present

## 2019-07-27 DIAGNOSIS — D509 Iron deficiency anemia, unspecified: Secondary | ICD-10-CM | POA: Diagnosis not present

## 2019-07-27 DIAGNOSIS — E1129 Type 2 diabetes mellitus with other diabetic kidney complication: Secondary | ICD-10-CM | POA: Diagnosis not present

## 2019-07-27 DIAGNOSIS — Z992 Dependence on renal dialysis: Secondary | ICD-10-CM | POA: Diagnosis not present

## 2019-07-27 DIAGNOSIS — D689 Coagulation defect, unspecified: Secondary | ICD-10-CM | POA: Diagnosis not present

## 2019-07-27 DIAGNOSIS — N2581 Secondary hyperparathyroidism of renal origin: Secondary | ICD-10-CM | POA: Diagnosis not present

## 2019-07-27 DIAGNOSIS — N186 End stage renal disease: Secondary | ICD-10-CM | POA: Diagnosis not present

## 2019-07-29 DIAGNOSIS — E1129 Type 2 diabetes mellitus with other diabetic kidney complication: Secondary | ICD-10-CM | POA: Diagnosis not present

## 2019-07-29 DIAGNOSIS — N2581 Secondary hyperparathyroidism of renal origin: Secondary | ICD-10-CM | POA: Diagnosis not present

## 2019-07-29 DIAGNOSIS — D509 Iron deficiency anemia, unspecified: Secondary | ICD-10-CM | POA: Diagnosis not present

## 2019-07-29 DIAGNOSIS — D689 Coagulation defect, unspecified: Secondary | ICD-10-CM | POA: Diagnosis not present

## 2019-07-29 DIAGNOSIS — N186 End stage renal disease: Secondary | ICD-10-CM | POA: Diagnosis not present

## 2019-07-29 DIAGNOSIS — Z992 Dependence on renal dialysis: Secondary | ICD-10-CM | POA: Diagnosis not present

## 2019-08-01 DIAGNOSIS — D509 Iron deficiency anemia, unspecified: Secondary | ICD-10-CM | POA: Diagnosis not present

## 2019-08-01 DIAGNOSIS — E1129 Type 2 diabetes mellitus with other diabetic kidney complication: Secondary | ICD-10-CM | POA: Diagnosis not present

## 2019-08-01 DIAGNOSIS — N2581 Secondary hyperparathyroidism of renal origin: Secondary | ICD-10-CM | POA: Diagnosis not present

## 2019-08-01 DIAGNOSIS — Z992 Dependence on renal dialysis: Secondary | ICD-10-CM | POA: Diagnosis not present

## 2019-08-01 DIAGNOSIS — D689 Coagulation defect, unspecified: Secondary | ICD-10-CM | POA: Diagnosis not present

## 2019-08-01 DIAGNOSIS — N186 End stage renal disease: Secondary | ICD-10-CM | POA: Diagnosis not present

## 2019-08-03 DIAGNOSIS — E1129 Type 2 diabetes mellitus with other diabetic kidney complication: Secondary | ICD-10-CM | POA: Diagnosis not present

## 2019-08-03 DIAGNOSIS — Z992 Dependence on renal dialysis: Secondary | ICD-10-CM | POA: Diagnosis not present

## 2019-08-03 DIAGNOSIS — N2581 Secondary hyperparathyroidism of renal origin: Secondary | ICD-10-CM | POA: Diagnosis not present

## 2019-08-03 DIAGNOSIS — N186 End stage renal disease: Secondary | ICD-10-CM | POA: Diagnosis not present

## 2019-08-03 DIAGNOSIS — D689 Coagulation defect, unspecified: Secondary | ICD-10-CM | POA: Diagnosis not present

## 2019-08-03 DIAGNOSIS — D509 Iron deficiency anemia, unspecified: Secondary | ICD-10-CM | POA: Diagnosis not present

## 2019-08-05 DIAGNOSIS — D689 Coagulation defect, unspecified: Secondary | ICD-10-CM | POA: Diagnosis not present

## 2019-08-05 DIAGNOSIS — D509 Iron deficiency anemia, unspecified: Secondary | ICD-10-CM | POA: Diagnosis not present

## 2019-08-05 DIAGNOSIS — Z992 Dependence on renal dialysis: Secondary | ICD-10-CM | POA: Diagnosis not present

## 2019-08-05 DIAGNOSIS — E1129 Type 2 diabetes mellitus with other diabetic kidney complication: Secondary | ICD-10-CM | POA: Diagnosis not present

## 2019-08-05 DIAGNOSIS — N186 End stage renal disease: Secondary | ICD-10-CM | POA: Diagnosis not present

## 2019-08-05 DIAGNOSIS — N2581 Secondary hyperparathyroidism of renal origin: Secondary | ICD-10-CM | POA: Diagnosis not present

## 2019-08-08 DIAGNOSIS — Z992 Dependence on renal dialysis: Secondary | ICD-10-CM | POA: Diagnosis not present

## 2019-08-08 DIAGNOSIS — D509 Iron deficiency anemia, unspecified: Secondary | ICD-10-CM | POA: Diagnosis not present

## 2019-08-08 DIAGNOSIS — N186 End stage renal disease: Secondary | ICD-10-CM | POA: Diagnosis not present

## 2019-08-08 DIAGNOSIS — E1129 Type 2 diabetes mellitus with other diabetic kidney complication: Secondary | ICD-10-CM | POA: Diagnosis not present

## 2019-08-08 DIAGNOSIS — N2581 Secondary hyperparathyroidism of renal origin: Secondary | ICD-10-CM | POA: Diagnosis not present

## 2019-08-08 DIAGNOSIS — D689 Coagulation defect, unspecified: Secondary | ICD-10-CM | POA: Diagnosis not present

## 2019-08-10 DIAGNOSIS — N186 End stage renal disease: Secondary | ICD-10-CM | POA: Diagnosis not present

## 2019-08-10 DIAGNOSIS — N2581 Secondary hyperparathyroidism of renal origin: Secondary | ICD-10-CM | POA: Diagnosis not present

## 2019-08-10 DIAGNOSIS — D689 Coagulation defect, unspecified: Secondary | ICD-10-CM | POA: Diagnosis not present

## 2019-08-10 DIAGNOSIS — Z992 Dependence on renal dialysis: Secondary | ICD-10-CM | POA: Diagnosis not present

## 2019-08-10 DIAGNOSIS — E1129 Type 2 diabetes mellitus with other diabetic kidney complication: Secondary | ICD-10-CM | POA: Diagnosis not present

## 2019-08-10 DIAGNOSIS — D509 Iron deficiency anemia, unspecified: Secondary | ICD-10-CM | POA: Diagnosis not present

## 2019-08-11 DIAGNOSIS — Z0000 Encounter for general adult medical examination without abnormal findings: Secondary | ICD-10-CM | POA: Diagnosis not present

## 2019-08-11 DIAGNOSIS — I10 Essential (primary) hypertension: Secondary | ICD-10-CM | POA: Diagnosis not present

## 2019-08-12 DIAGNOSIS — Z992 Dependence on renal dialysis: Secondary | ICD-10-CM | POA: Diagnosis not present

## 2019-08-12 DIAGNOSIS — E1129 Type 2 diabetes mellitus with other diabetic kidney complication: Secondary | ICD-10-CM | POA: Diagnosis not present

## 2019-08-12 DIAGNOSIS — D689 Coagulation defect, unspecified: Secondary | ICD-10-CM | POA: Diagnosis not present

## 2019-08-12 DIAGNOSIS — N186 End stage renal disease: Secondary | ICD-10-CM | POA: Diagnosis not present

## 2019-08-12 DIAGNOSIS — D509 Iron deficiency anemia, unspecified: Secondary | ICD-10-CM | POA: Diagnosis not present

## 2019-08-12 DIAGNOSIS — N2581 Secondary hyperparathyroidism of renal origin: Secondary | ICD-10-CM | POA: Diagnosis not present

## 2019-08-17 DIAGNOSIS — D689 Coagulation defect, unspecified: Secondary | ICD-10-CM | POA: Diagnosis not present

## 2019-08-17 DIAGNOSIS — D509 Iron deficiency anemia, unspecified: Secondary | ICD-10-CM | POA: Diagnosis not present

## 2019-08-17 DIAGNOSIS — N2581 Secondary hyperparathyroidism of renal origin: Secondary | ICD-10-CM | POA: Diagnosis not present

## 2019-08-17 DIAGNOSIS — E1129 Type 2 diabetes mellitus with other diabetic kidney complication: Secondary | ICD-10-CM | POA: Diagnosis not present

## 2019-08-17 DIAGNOSIS — Z992 Dependence on renal dialysis: Secondary | ICD-10-CM | POA: Diagnosis not present

## 2019-08-17 DIAGNOSIS — N186 End stage renal disease: Secondary | ICD-10-CM | POA: Diagnosis not present

## 2019-08-19 DIAGNOSIS — Z992 Dependence on renal dialysis: Secondary | ICD-10-CM | POA: Diagnosis not present

## 2019-08-19 DIAGNOSIS — D689 Coagulation defect, unspecified: Secondary | ICD-10-CM | POA: Diagnosis not present

## 2019-08-19 DIAGNOSIS — E1129 Type 2 diabetes mellitus with other diabetic kidney complication: Secondary | ICD-10-CM | POA: Diagnosis not present

## 2019-08-19 DIAGNOSIS — D509 Iron deficiency anemia, unspecified: Secondary | ICD-10-CM | POA: Diagnosis not present

## 2019-08-19 DIAGNOSIS — N186 End stage renal disease: Secondary | ICD-10-CM | POA: Diagnosis not present

## 2019-08-19 DIAGNOSIS — N2581 Secondary hyperparathyroidism of renal origin: Secondary | ICD-10-CM | POA: Diagnosis not present

## 2019-08-20 DIAGNOSIS — Z992 Dependence on renal dialysis: Secondary | ICD-10-CM | POA: Diagnosis not present

## 2019-08-20 DIAGNOSIS — N186 End stage renal disease: Secondary | ICD-10-CM | POA: Diagnosis not present

## 2019-08-20 DIAGNOSIS — I129 Hypertensive chronic kidney disease with stage 1 through stage 4 chronic kidney disease, or unspecified chronic kidney disease: Secondary | ICD-10-CM | POA: Diagnosis not present

## 2019-08-22 DIAGNOSIS — Z992 Dependence on renal dialysis: Secondary | ICD-10-CM | POA: Diagnosis not present

## 2019-08-22 DIAGNOSIS — D509 Iron deficiency anemia, unspecified: Secondary | ICD-10-CM | POA: Diagnosis not present

## 2019-08-22 DIAGNOSIS — N186 End stage renal disease: Secondary | ICD-10-CM | POA: Diagnosis not present

## 2019-08-22 DIAGNOSIS — E1129 Type 2 diabetes mellitus with other diabetic kidney complication: Secondary | ICD-10-CM | POA: Diagnosis not present

## 2019-08-22 DIAGNOSIS — D689 Coagulation defect, unspecified: Secondary | ICD-10-CM | POA: Diagnosis not present

## 2019-08-22 DIAGNOSIS — D631 Anemia in chronic kidney disease: Secondary | ICD-10-CM | POA: Diagnosis not present

## 2019-08-22 DIAGNOSIS — N2581 Secondary hyperparathyroidism of renal origin: Secondary | ICD-10-CM | POA: Diagnosis not present

## 2019-08-24 DIAGNOSIS — N186 End stage renal disease: Secondary | ICD-10-CM | POA: Diagnosis not present

## 2019-08-24 DIAGNOSIS — E1129 Type 2 diabetes mellitus with other diabetic kidney complication: Secondary | ICD-10-CM | POA: Diagnosis not present

## 2019-08-24 DIAGNOSIS — D631 Anemia in chronic kidney disease: Secondary | ICD-10-CM | POA: Diagnosis not present

## 2019-08-24 DIAGNOSIS — N2581 Secondary hyperparathyroidism of renal origin: Secondary | ICD-10-CM | POA: Diagnosis not present

## 2019-08-24 DIAGNOSIS — D509 Iron deficiency anemia, unspecified: Secondary | ICD-10-CM | POA: Diagnosis not present

## 2019-08-24 DIAGNOSIS — Z992 Dependence on renal dialysis: Secondary | ICD-10-CM | POA: Diagnosis not present

## 2019-08-24 DIAGNOSIS — D689 Coagulation defect, unspecified: Secondary | ICD-10-CM | POA: Diagnosis not present

## 2019-08-29 DIAGNOSIS — N2581 Secondary hyperparathyroidism of renal origin: Secondary | ICD-10-CM | POA: Diagnosis not present

## 2019-08-29 DIAGNOSIS — D631 Anemia in chronic kidney disease: Secondary | ICD-10-CM | POA: Diagnosis not present

## 2019-08-29 DIAGNOSIS — Z992 Dependence on renal dialysis: Secondary | ICD-10-CM | POA: Diagnosis not present

## 2019-08-29 DIAGNOSIS — N186 End stage renal disease: Secondary | ICD-10-CM | POA: Diagnosis not present

## 2019-08-29 DIAGNOSIS — E1129 Type 2 diabetes mellitus with other diabetic kidney complication: Secondary | ICD-10-CM | POA: Diagnosis not present

## 2019-08-29 DIAGNOSIS — D689 Coagulation defect, unspecified: Secondary | ICD-10-CM | POA: Diagnosis not present

## 2019-08-29 DIAGNOSIS — D509 Iron deficiency anemia, unspecified: Secondary | ICD-10-CM | POA: Diagnosis not present

## 2019-08-31 DIAGNOSIS — D689 Coagulation defect, unspecified: Secondary | ICD-10-CM | POA: Diagnosis not present

## 2019-08-31 DIAGNOSIS — D509 Iron deficiency anemia, unspecified: Secondary | ICD-10-CM | POA: Diagnosis not present

## 2019-08-31 DIAGNOSIS — Z992 Dependence on renal dialysis: Secondary | ICD-10-CM | POA: Diagnosis not present

## 2019-08-31 DIAGNOSIS — D631 Anemia in chronic kidney disease: Secondary | ICD-10-CM | POA: Diagnosis not present

## 2019-08-31 DIAGNOSIS — N2581 Secondary hyperparathyroidism of renal origin: Secondary | ICD-10-CM | POA: Diagnosis not present

## 2019-08-31 DIAGNOSIS — N186 End stage renal disease: Secondary | ICD-10-CM | POA: Diagnosis not present

## 2019-08-31 DIAGNOSIS — E1129 Type 2 diabetes mellitus with other diabetic kidney complication: Secondary | ICD-10-CM | POA: Diagnosis not present

## 2019-09-02 DIAGNOSIS — D509 Iron deficiency anemia, unspecified: Secondary | ICD-10-CM | POA: Diagnosis not present

## 2019-09-02 DIAGNOSIS — N2581 Secondary hyperparathyroidism of renal origin: Secondary | ICD-10-CM | POA: Diagnosis not present

## 2019-09-02 DIAGNOSIS — D689 Coagulation defect, unspecified: Secondary | ICD-10-CM | POA: Diagnosis not present

## 2019-09-02 DIAGNOSIS — Z992 Dependence on renal dialysis: Secondary | ICD-10-CM | POA: Diagnosis not present

## 2019-09-02 DIAGNOSIS — E1129 Type 2 diabetes mellitus with other diabetic kidney complication: Secondary | ICD-10-CM | POA: Diagnosis not present

## 2019-09-02 DIAGNOSIS — D631 Anemia in chronic kidney disease: Secondary | ICD-10-CM | POA: Diagnosis not present

## 2019-09-02 DIAGNOSIS — N186 End stage renal disease: Secondary | ICD-10-CM | POA: Diagnosis not present

## 2019-09-05 DIAGNOSIS — E1129 Type 2 diabetes mellitus with other diabetic kidney complication: Secondary | ICD-10-CM | POA: Diagnosis not present

## 2019-09-05 DIAGNOSIS — N2581 Secondary hyperparathyroidism of renal origin: Secondary | ICD-10-CM | POA: Diagnosis not present

## 2019-09-05 DIAGNOSIS — Z992 Dependence on renal dialysis: Secondary | ICD-10-CM | POA: Diagnosis not present

## 2019-09-05 DIAGNOSIS — D631 Anemia in chronic kidney disease: Secondary | ICD-10-CM | POA: Diagnosis not present

## 2019-09-05 DIAGNOSIS — N186 End stage renal disease: Secondary | ICD-10-CM | POA: Diagnosis not present

## 2019-09-05 DIAGNOSIS — D689 Coagulation defect, unspecified: Secondary | ICD-10-CM | POA: Diagnosis not present

## 2019-09-05 DIAGNOSIS — D509 Iron deficiency anemia, unspecified: Secondary | ICD-10-CM | POA: Diagnosis not present

## 2019-09-07 DIAGNOSIS — E1129 Type 2 diabetes mellitus with other diabetic kidney complication: Secondary | ICD-10-CM | POA: Diagnosis not present

## 2019-09-07 DIAGNOSIS — D689 Coagulation defect, unspecified: Secondary | ICD-10-CM | POA: Diagnosis not present

## 2019-09-07 DIAGNOSIS — N2581 Secondary hyperparathyroidism of renal origin: Secondary | ICD-10-CM | POA: Diagnosis not present

## 2019-09-07 DIAGNOSIS — N186 End stage renal disease: Secondary | ICD-10-CM | POA: Diagnosis not present

## 2019-09-07 DIAGNOSIS — D509 Iron deficiency anemia, unspecified: Secondary | ICD-10-CM | POA: Diagnosis not present

## 2019-09-07 DIAGNOSIS — D631 Anemia in chronic kidney disease: Secondary | ICD-10-CM | POA: Diagnosis not present

## 2019-09-07 DIAGNOSIS — Z992 Dependence on renal dialysis: Secondary | ICD-10-CM | POA: Diagnosis not present

## 2019-09-09 DIAGNOSIS — E1129 Type 2 diabetes mellitus with other diabetic kidney complication: Secondary | ICD-10-CM | POA: Diagnosis not present

## 2019-09-09 DIAGNOSIS — N2581 Secondary hyperparathyroidism of renal origin: Secondary | ICD-10-CM | POA: Diagnosis not present

## 2019-09-09 DIAGNOSIS — Z992 Dependence on renal dialysis: Secondary | ICD-10-CM | POA: Diagnosis not present

## 2019-09-09 DIAGNOSIS — D509 Iron deficiency anemia, unspecified: Secondary | ICD-10-CM | POA: Diagnosis not present

## 2019-09-09 DIAGNOSIS — D631 Anemia in chronic kidney disease: Secondary | ICD-10-CM | POA: Diagnosis not present

## 2019-09-09 DIAGNOSIS — D689 Coagulation defect, unspecified: Secondary | ICD-10-CM | POA: Diagnosis not present

## 2019-09-09 DIAGNOSIS — N186 End stage renal disease: Secondary | ICD-10-CM | POA: Diagnosis not present

## 2019-09-12 DIAGNOSIS — E1129 Type 2 diabetes mellitus with other diabetic kidney complication: Secondary | ICD-10-CM | POA: Diagnosis not present

## 2019-09-12 DIAGNOSIS — D631 Anemia in chronic kidney disease: Secondary | ICD-10-CM | POA: Diagnosis not present

## 2019-09-12 DIAGNOSIS — D509 Iron deficiency anemia, unspecified: Secondary | ICD-10-CM | POA: Diagnosis not present

## 2019-09-12 DIAGNOSIS — N2581 Secondary hyperparathyroidism of renal origin: Secondary | ICD-10-CM | POA: Diagnosis not present

## 2019-09-12 DIAGNOSIS — D689 Coagulation defect, unspecified: Secondary | ICD-10-CM | POA: Diagnosis not present

## 2019-09-12 DIAGNOSIS — N186 End stage renal disease: Secondary | ICD-10-CM | POA: Diagnosis not present

## 2019-09-12 DIAGNOSIS — Z992 Dependence on renal dialysis: Secondary | ICD-10-CM | POA: Diagnosis not present

## 2019-09-14 DIAGNOSIS — E1129 Type 2 diabetes mellitus with other diabetic kidney complication: Secondary | ICD-10-CM | POA: Diagnosis not present

## 2019-09-14 DIAGNOSIS — N2581 Secondary hyperparathyroidism of renal origin: Secondary | ICD-10-CM | POA: Diagnosis not present

## 2019-09-14 DIAGNOSIS — D689 Coagulation defect, unspecified: Secondary | ICD-10-CM | POA: Diagnosis not present

## 2019-09-14 DIAGNOSIS — D631 Anemia in chronic kidney disease: Secondary | ICD-10-CM | POA: Diagnosis not present

## 2019-09-14 DIAGNOSIS — Z992 Dependence on renal dialysis: Secondary | ICD-10-CM | POA: Diagnosis not present

## 2019-09-14 DIAGNOSIS — N186 End stage renal disease: Secondary | ICD-10-CM | POA: Diagnosis not present

## 2019-09-14 DIAGNOSIS — D509 Iron deficiency anemia, unspecified: Secondary | ICD-10-CM | POA: Diagnosis not present

## 2019-09-16 DIAGNOSIS — D509 Iron deficiency anemia, unspecified: Secondary | ICD-10-CM | POA: Diagnosis not present

## 2019-09-16 DIAGNOSIS — Z992 Dependence on renal dialysis: Secondary | ICD-10-CM | POA: Diagnosis not present

## 2019-09-16 DIAGNOSIS — E1129 Type 2 diabetes mellitus with other diabetic kidney complication: Secondary | ICD-10-CM | POA: Diagnosis not present

## 2019-09-16 DIAGNOSIS — N2581 Secondary hyperparathyroidism of renal origin: Secondary | ICD-10-CM | POA: Diagnosis not present

## 2019-09-16 DIAGNOSIS — D689 Coagulation defect, unspecified: Secondary | ICD-10-CM | POA: Diagnosis not present

## 2019-09-16 DIAGNOSIS — D631 Anemia in chronic kidney disease: Secondary | ICD-10-CM | POA: Diagnosis not present

## 2019-09-16 DIAGNOSIS — N186 End stage renal disease: Secondary | ICD-10-CM | POA: Diagnosis not present

## 2019-09-19 DIAGNOSIS — Z992 Dependence on renal dialysis: Secondary | ICD-10-CM | POA: Diagnosis not present

## 2019-09-19 DIAGNOSIS — N2581 Secondary hyperparathyroidism of renal origin: Secondary | ICD-10-CM | POA: Diagnosis not present

## 2019-09-19 DIAGNOSIS — D689 Coagulation defect, unspecified: Secondary | ICD-10-CM | POA: Diagnosis not present

## 2019-09-19 DIAGNOSIS — D509 Iron deficiency anemia, unspecified: Secondary | ICD-10-CM | POA: Diagnosis not present

## 2019-09-19 DIAGNOSIS — N186 End stage renal disease: Secondary | ICD-10-CM | POA: Diagnosis not present

## 2019-09-19 DIAGNOSIS — D631 Anemia in chronic kidney disease: Secondary | ICD-10-CM | POA: Diagnosis not present

## 2019-09-19 DIAGNOSIS — E1129 Type 2 diabetes mellitus with other diabetic kidney complication: Secondary | ICD-10-CM | POA: Diagnosis not present

## 2019-09-20 DIAGNOSIS — Z992 Dependence on renal dialysis: Secondary | ICD-10-CM | POA: Diagnosis not present

## 2019-09-20 DIAGNOSIS — I129 Hypertensive chronic kidney disease with stage 1 through stage 4 chronic kidney disease, or unspecified chronic kidney disease: Secondary | ICD-10-CM | POA: Diagnosis not present

## 2019-09-20 DIAGNOSIS — N186 End stage renal disease: Secondary | ICD-10-CM | POA: Diagnosis not present

## 2019-09-21 DIAGNOSIS — Z23 Encounter for immunization: Secondary | ICD-10-CM | POA: Diagnosis not present

## 2019-09-21 DIAGNOSIS — D689 Coagulation defect, unspecified: Secondary | ICD-10-CM | POA: Diagnosis not present

## 2019-09-21 DIAGNOSIS — N186 End stage renal disease: Secondary | ICD-10-CM | POA: Diagnosis not present

## 2019-09-21 DIAGNOSIS — E1129 Type 2 diabetes mellitus with other diabetic kidney complication: Secondary | ICD-10-CM | POA: Diagnosis not present

## 2019-09-21 DIAGNOSIS — Z992 Dependence on renal dialysis: Secondary | ICD-10-CM | POA: Diagnosis not present

## 2019-09-21 DIAGNOSIS — N2581 Secondary hyperparathyroidism of renal origin: Secondary | ICD-10-CM | POA: Diagnosis not present

## 2019-09-21 DIAGNOSIS — D509 Iron deficiency anemia, unspecified: Secondary | ICD-10-CM | POA: Diagnosis not present

## 2019-09-23 DIAGNOSIS — D689 Coagulation defect, unspecified: Secondary | ICD-10-CM | POA: Diagnosis not present

## 2019-09-23 DIAGNOSIS — D509 Iron deficiency anemia, unspecified: Secondary | ICD-10-CM | POA: Diagnosis not present

## 2019-09-23 DIAGNOSIS — N186 End stage renal disease: Secondary | ICD-10-CM | POA: Diagnosis not present

## 2019-09-23 DIAGNOSIS — N2581 Secondary hyperparathyroidism of renal origin: Secondary | ICD-10-CM | POA: Diagnosis not present

## 2019-09-23 DIAGNOSIS — E1129 Type 2 diabetes mellitus with other diabetic kidney complication: Secondary | ICD-10-CM | POA: Diagnosis not present

## 2019-09-23 DIAGNOSIS — Z23 Encounter for immunization: Secondary | ICD-10-CM | POA: Diagnosis not present

## 2019-09-23 DIAGNOSIS — Z992 Dependence on renal dialysis: Secondary | ICD-10-CM | POA: Diagnosis not present

## 2019-09-28 DIAGNOSIS — D509 Iron deficiency anemia, unspecified: Secondary | ICD-10-CM | POA: Diagnosis not present

## 2019-09-28 DIAGNOSIS — E1129 Type 2 diabetes mellitus with other diabetic kidney complication: Secondary | ICD-10-CM | POA: Diagnosis not present

## 2019-09-28 DIAGNOSIS — Z23 Encounter for immunization: Secondary | ICD-10-CM | POA: Diagnosis not present

## 2019-09-28 DIAGNOSIS — N186 End stage renal disease: Secondary | ICD-10-CM | POA: Diagnosis not present

## 2019-09-28 DIAGNOSIS — Z992 Dependence on renal dialysis: Secondary | ICD-10-CM | POA: Diagnosis not present

## 2019-09-28 DIAGNOSIS — N2581 Secondary hyperparathyroidism of renal origin: Secondary | ICD-10-CM | POA: Diagnosis not present

## 2019-09-28 DIAGNOSIS — D689 Coagulation defect, unspecified: Secondary | ICD-10-CM | POA: Diagnosis not present

## 2019-09-30 DIAGNOSIS — N2581 Secondary hyperparathyroidism of renal origin: Secondary | ICD-10-CM | POA: Diagnosis not present

## 2019-09-30 DIAGNOSIS — Z23 Encounter for immunization: Secondary | ICD-10-CM | POA: Diagnosis not present

## 2019-09-30 DIAGNOSIS — D689 Coagulation defect, unspecified: Secondary | ICD-10-CM | POA: Diagnosis not present

## 2019-09-30 DIAGNOSIS — D509 Iron deficiency anemia, unspecified: Secondary | ICD-10-CM | POA: Diagnosis not present

## 2019-09-30 DIAGNOSIS — N186 End stage renal disease: Secondary | ICD-10-CM | POA: Diagnosis not present

## 2019-09-30 DIAGNOSIS — Z992 Dependence on renal dialysis: Secondary | ICD-10-CM | POA: Diagnosis not present

## 2019-09-30 DIAGNOSIS — E1129 Type 2 diabetes mellitus with other diabetic kidney complication: Secondary | ICD-10-CM | POA: Diagnosis not present

## 2019-10-03 DIAGNOSIS — N2581 Secondary hyperparathyroidism of renal origin: Secondary | ICD-10-CM | POA: Diagnosis not present

## 2019-10-03 DIAGNOSIS — D509 Iron deficiency anemia, unspecified: Secondary | ICD-10-CM | POA: Diagnosis not present

## 2019-10-03 DIAGNOSIS — Z23 Encounter for immunization: Secondary | ICD-10-CM | POA: Diagnosis not present

## 2019-10-03 DIAGNOSIS — Z992 Dependence on renal dialysis: Secondary | ICD-10-CM | POA: Diagnosis not present

## 2019-10-03 DIAGNOSIS — D689 Coagulation defect, unspecified: Secondary | ICD-10-CM | POA: Diagnosis not present

## 2019-10-03 DIAGNOSIS — N186 End stage renal disease: Secondary | ICD-10-CM | POA: Diagnosis not present

## 2019-10-03 DIAGNOSIS — E1129 Type 2 diabetes mellitus with other diabetic kidney complication: Secondary | ICD-10-CM | POA: Diagnosis not present

## 2019-10-05 DIAGNOSIS — I129 Hypertensive chronic kidney disease with stage 1 through stage 4 chronic kidney disease, or unspecified chronic kidney disease: Secondary | ICD-10-CM | POA: Diagnosis not present

## 2019-10-05 DIAGNOSIS — J0100 Acute maxillary sinusitis, unspecified: Secondary | ICD-10-CM | POA: Diagnosis not present

## 2019-10-05 DIAGNOSIS — Z23 Encounter for immunization: Secondary | ICD-10-CM | POA: Diagnosis not present

## 2019-10-07 DIAGNOSIS — Z23 Encounter for immunization: Secondary | ICD-10-CM | POA: Diagnosis not present

## 2019-10-07 DIAGNOSIS — N2581 Secondary hyperparathyroidism of renal origin: Secondary | ICD-10-CM | POA: Diagnosis not present

## 2019-10-07 DIAGNOSIS — N186 End stage renal disease: Secondary | ICD-10-CM | POA: Diagnosis not present

## 2019-10-07 DIAGNOSIS — Z992 Dependence on renal dialysis: Secondary | ICD-10-CM | POA: Diagnosis not present

## 2019-10-07 DIAGNOSIS — E1129 Type 2 diabetes mellitus with other diabetic kidney complication: Secondary | ICD-10-CM | POA: Diagnosis not present

## 2019-10-07 DIAGNOSIS — D689 Coagulation defect, unspecified: Secondary | ICD-10-CM | POA: Diagnosis not present

## 2019-10-07 DIAGNOSIS — D509 Iron deficiency anemia, unspecified: Secondary | ICD-10-CM | POA: Diagnosis not present

## 2019-10-10 DIAGNOSIS — N2581 Secondary hyperparathyroidism of renal origin: Secondary | ICD-10-CM | POA: Diagnosis not present

## 2019-10-10 DIAGNOSIS — E1129 Type 2 diabetes mellitus with other diabetic kidney complication: Secondary | ICD-10-CM | POA: Diagnosis not present

## 2019-10-10 DIAGNOSIS — N186 End stage renal disease: Secondary | ICD-10-CM | POA: Diagnosis not present

## 2019-10-10 DIAGNOSIS — Z992 Dependence on renal dialysis: Secondary | ICD-10-CM | POA: Diagnosis not present

## 2019-10-10 DIAGNOSIS — D689 Coagulation defect, unspecified: Secondary | ICD-10-CM | POA: Diagnosis not present

## 2019-10-10 DIAGNOSIS — Z23 Encounter for immunization: Secondary | ICD-10-CM | POA: Diagnosis not present

## 2019-10-10 DIAGNOSIS — D509 Iron deficiency anemia, unspecified: Secondary | ICD-10-CM | POA: Diagnosis not present

## 2019-10-12 DIAGNOSIS — N186 End stage renal disease: Secondary | ICD-10-CM | POA: Diagnosis not present

## 2019-10-12 DIAGNOSIS — N2581 Secondary hyperparathyroidism of renal origin: Secondary | ICD-10-CM | POA: Diagnosis not present

## 2019-10-12 DIAGNOSIS — D689 Coagulation defect, unspecified: Secondary | ICD-10-CM | POA: Diagnosis not present

## 2019-10-12 DIAGNOSIS — E1129 Type 2 diabetes mellitus with other diabetic kidney complication: Secondary | ICD-10-CM | POA: Diagnosis not present

## 2019-10-12 DIAGNOSIS — Z23 Encounter for immunization: Secondary | ICD-10-CM | POA: Diagnosis not present

## 2019-10-12 DIAGNOSIS — D509 Iron deficiency anemia, unspecified: Secondary | ICD-10-CM | POA: Diagnosis not present

## 2019-10-12 DIAGNOSIS — Z992 Dependence on renal dialysis: Secondary | ICD-10-CM | POA: Diagnosis not present

## 2019-10-14 DIAGNOSIS — D689 Coagulation defect, unspecified: Secondary | ICD-10-CM | POA: Diagnosis not present

## 2019-10-14 DIAGNOSIS — N2581 Secondary hyperparathyroidism of renal origin: Secondary | ICD-10-CM | POA: Diagnosis not present

## 2019-10-14 DIAGNOSIS — Z23 Encounter for immunization: Secondary | ICD-10-CM | POA: Diagnosis not present

## 2019-10-14 DIAGNOSIS — E1129 Type 2 diabetes mellitus with other diabetic kidney complication: Secondary | ICD-10-CM | POA: Diagnosis not present

## 2019-10-14 DIAGNOSIS — Z992 Dependence on renal dialysis: Secondary | ICD-10-CM | POA: Diagnosis not present

## 2019-10-14 DIAGNOSIS — D509 Iron deficiency anemia, unspecified: Secondary | ICD-10-CM | POA: Diagnosis not present

## 2019-10-14 DIAGNOSIS — N186 End stage renal disease: Secondary | ICD-10-CM | POA: Diagnosis not present

## 2019-10-17 DIAGNOSIS — Z992 Dependence on renal dialysis: Secondary | ICD-10-CM | POA: Diagnosis not present

## 2019-10-17 DIAGNOSIS — E1129 Type 2 diabetes mellitus with other diabetic kidney complication: Secondary | ICD-10-CM | POA: Diagnosis not present

## 2019-10-17 DIAGNOSIS — N186 End stage renal disease: Secondary | ICD-10-CM | POA: Diagnosis not present

## 2019-10-17 DIAGNOSIS — D509 Iron deficiency anemia, unspecified: Secondary | ICD-10-CM | POA: Diagnosis not present

## 2019-10-17 DIAGNOSIS — Z23 Encounter for immunization: Secondary | ICD-10-CM | POA: Diagnosis not present

## 2019-10-17 DIAGNOSIS — N2581 Secondary hyperparathyroidism of renal origin: Secondary | ICD-10-CM | POA: Diagnosis not present

## 2019-10-17 DIAGNOSIS — D689 Coagulation defect, unspecified: Secondary | ICD-10-CM | POA: Diagnosis not present

## 2019-10-19 DIAGNOSIS — N2581 Secondary hyperparathyroidism of renal origin: Secondary | ICD-10-CM | POA: Diagnosis not present

## 2019-10-19 DIAGNOSIS — D689 Coagulation defect, unspecified: Secondary | ICD-10-CM | POA: Diagnosis not present

## 2019-10-19 DIAGNOSIS — N186 End stage renal disease: Secondary | ICD-10-CM | POA: Diagnosis not present

## 2019-10-19 DIAGNOSIS — Z23 Encounter for immunization: Secondary | ICD-10-CM | POA: Diagnosis not present

## 2019-10-19 DIAGNOSIS — E1129 Type 2 diabetes mellitus with other diabetic kidney complication: Secondary | ICD-10-CM | POA: Diagnosis not present

## 2019-10-19 DIAGNOSIS — Z992 Dependence on renal dialysis: Secondary | ICD-10-CM | POA: Diagnosis not present

## 2019-10-19 DIAGNOSIS — D509 Iron deficiency anemia, unspecified: Secondary | ICD-10-CM | POA: Diagnosis not present

## 2019-10-20 DIAGNOSIS — Z992 Dependence on renal dialysis: Secondary | ICD-10-CM | POA: Diagnosis not present

## 2019-10-20 DIAGNOSIS — I129 Hypertensive chronic kidney disease with stage 1 through stage 4 chronic kidney disease, or unspecified chronic kidney disease: Secondary | ICD-10-CM | POA: Diagnosis not present

## 2019-10-20 DIAGNOSIS — N186 End stage renal disease: Secondary | ICD-10-CM | POA: Diagnosis not present

## 2019-10-21 DIAGNOSIS — Z992 Dependence on renal dialysis: Secondary | ICD-10-CM | POA: Diagnosis not present

## 2019-10-21 DIAGNOSIS — D689 Coagulation defect, unspecified: Secondary | ICD-10-CM | POA: Diagnosis not present

## 2019-10-21 DIAGNOSIS — D631 Anemia in chronic kidney disease: Secondary | ICD-10-CM | POA: Diagnosis not present

## 2019-10-21 DIAGNOSIS — D509 Iron deficiency anemia, unspecified: Secondary | ICD-10-CM | POA: Diagnosis not present

## 2019-10-21 DIAGNOSIS — N186 End stage renal disease: Secondary | ICD-10-CM | POA: Diagnosis not present

## 2019-10-21 DIAGNOSIS — Z23 Encounter for immunization: Secondary | ICD-10-CM | POA: Diagnosis not present

## 2019-10-21 DIAGNOSIS — E1129 Type 2 diabetes mellitus with other diabetic kidney complication: Secondary | ICD-10-CM | POA: Diagnosis not present

## 2019-10-21 DIAGNOSIS — N2581 Secondary hyperparathyroidism of renal origin: Secondary | ICD-10-CM | POA: Diagnosis not present

## 2019-10-21 DIAGNOSIS — R52 Pain, unspecified: Secondary | ICD-10-CM | POA: Diagnosis not present

## 2019-10-24 DIAGNOSIS — D689 Coagulation defect, unspecified: Secondary | ICD-10-CM | POA: Diagnosis not present

## 2019-10-24 DIAGNOSIS — Z992 Dependence on renal dialysis: Secondary | ICD-10-CM | POA: Diagnosis not present

## 2019-10-24 DIAGNOSIS — N186 End stage renal disease: Secondary | ICD-10-CM | POA: Diagnosis not present

## 2019-10-24 DIAGNOSIS — D509 Iron deficiency anemia, unspecified: Secondary | ICD-10-CM | POA: Diagnosis not present

## 2019-10-24 DIAGNOSIS — N2581 Secondary hyperparathyroidism of renal origin: Secondary | ICD-10-CM | POA: Diagnosis not present

## 2019-10-24 DIAGNOSIS — Z23 Encounter for immunization: Secondary | ICD-10-CM | POA: Diagnosis not present

## 2019-10-24 DIAGNOSIS — R52 Pain, unspecified: Secondary | ICD-10-CM | POA: Diagnosis not present

## 2019-10-24 DIAGNOSIS — E1129 Type 2 diabetes mellitus with other diabetic kidney complication: Secondary | ICD-10-CM | POA: Diagnosis not present

## 2019-10-24 DIAGNOSIS — D631 Anemia in chronic kidney disease: Secondary | ICD-10-CM | POA: Diagnosis not present

## 2019-10-26 DIAGNOSIS — E1129 Type 2 diabetes mellitus with other diabetic kidney complication: Secondary | ICD-10-CM | POA: Diagnosis not present

## 2019-10-26 DIAGNOSIS — N186 End stage renal disease: Secondary | ICD-10-CM | POA: Diagnosis not present

## 2019-10-26 DIAGNOSIS — D689 Coagulation defect, unspecified: Secondary | ICD-10-CM | POA: Diagnosis not present

## 2019-10-26 DIAGNOSIS — Z23 Encounter for immunization: Secondary | ICD-10-CM | POA: Diagnosis not present

## 2019-10-26 DIAGNOSIS — D631 Anemia in chronic kidney disease: Secondary | ICD-10-CM | POA: Diagnosis not present

## 2019-10-26 DIAGNOSIS — N2581 Secondary hyperparathyroidism of renal origin: Secondary | ICD-10-CM | POA: Diagnosis not present

## 2019-10-26 DIAGNOSIS — Z992 Dependence on renal dialysis: Secondary | ICD-10-CM | POA: Diagnosis not present

## 2019-10-26 DIAGNOSIS — R52 Pain, unspecified: Secondary | ICD-10-CM | POA: Diagnosis not present

## 2019-10-26 DIAGNOSIS — D509 Iron deficiency anemia, unspecified: Secondary | ICD-10-CM | POA: Diagnosis not present

## 2019-10-28 DIAGNOSIS — R52 Pain, unspecified: Secondary | ICD-10-CM | POA: Diagnosis not present

## 2019-10-28 DIAGNOSIS — N2581 Secondary hyperparathyroidism of renal origin: Secondary | ICD-10-CM | POA: Diagnosis not present

## 2019-10-28 DIAGNOSIS — D631 Anemia in chronic kidney disease: Secondary | ICD-10-CM | POA: Diagnosis not present

## 2019-10-28 DIAGNOSIS — N186 End stage renal disease: Secondary | ICD-10-CM | POA: Diagnosis not present

## 2019-10-28 DIAGNOSIS — E1129 Type 2 diabetes mellitus with other diabetic kidney complication: Secondary | ICD-10-CM | POA: Diagnosis not present

## 2019-10-28 DIAGNOSIS — D689 Coagulation defect, unspecified: Secondary | ICD-10-CM | POA: Diagnosis not present

## 2019-10-28 DIAGNOSIS — D509 Iron deficiency anemia, unspecified: Secondary | ICD-10-CM | POA: Diagnosis not present

## 2019-10-28 DIAGNOSIS — Z992 Dependence on renal dialysis: Secondary | ICD-10-CM | POA: Diagnosis not present

## 2019-10-28 DIAGNOSIS — Z23 Encounter for immunization: Secondary | ICD-10-CM | POA: Diagnosis not present

## 2019-10-31 DIAGNOSIS — Z992 Dependence on renal dialysis: Secondary | ICD-10-CM | POA: Diagnosis not present

## 2019-10-31 DIAGNOSIS — D689 Coagulation defect, unspecified: Secondary | ICD-10-CM | POA: Diagnosis not present

## 2019-10-31 DIAGNOSIS — D509 Iron deficiency anemia, unspecified: Secondary | ICD-10-CM | POA: Diagnosis not present

## 2019-10-31 DIAGNOSIS — D631 Anemia in chronic kidney disease: Secondary | ICD-10-CM | POA: Diagnosis not present

## 2019-10-31 DIAGNOSIS — R52 Pain, unspecified: Secondary | ICD-10-CM | POA: Diagnosis not present

## 2019-10-31 DIAGNOSIS — N2581 Secondary hyperparathyroidism of renal origin: Secondary | ICD-10-CM | POA: Diagnosis not present

## 2019-10-31 DIAGNOSIS — E1129 Type 2 diabetes mellitus with other diabetic kidney complication: Secondary | ICD-10-CM | POA: Diagnosis not present

## 2019-10-31 DIAGNOSIS — Z23 Encounter for immunization: Secondary | ICD-10-CM | POA: Diagnosis not present

## 2019-10-31 DIAGNOSIS — N186 End stage renal disease: Secondary | ICD-10-CM | POA: Diagnosis not present

## 2019-11-02 DIAGNOSIS — N186 End stage renal disease: Secondary | ICD-10-CM | POA: Diagnosis not present

## 2019-11-02 DIAGNOSIS — D689 Coagulation defect, unspecified: Secondary | ICD-10-CM | POA: Diagnosis not present

## 2019-11-02 DIAGNOSIS — Z23 Encounter for immunization: Secondary | ICD-10-CM | POA: Diagnosis not present

## 2019-11-02 DIAGNOSIS — E1129 Type 2 diabetes mellitus with other diabetic kidney complication: Secondary | ICD-10-CM | POA: Diagnosis not present

## 2019-11-02 DIAGNOSIS — N2581 Secondary hyperparathyroidism of renal origin: Secondary | ICD-10-CM | POA: Diagnosis not present

## 2019-11-02 DIAGNOSIS — D631 Anemia in chronic kidney disease: Secondary | ICD-10-CM | POA: Diagnosis not present

## 2019-11-02 DIAGNOSIS — Z992 Dependence on renal dialysis: Secondary | ICD-10-CM | POA: Diagnosis not present

## 2019-11-02 DIAGNOSIS — D509 Iron deficiency anemia, unspecified: Secondary | ICD-10-CM | POA: Diagnosis not present

## 2019-11-02 DIAGNOSIS — R52 Pain, unspecified: Secondary | ICD-10-CM | POA: Diagnosis not present

## 2019-11-04 DIAGNOSIS — N186 End stage renal disease: Secondary | ICD-10-CM | POA: Diagnosis not present

## 2019-11-04 DIAGNOSIS — D631 Anemia in chronic kidney disease: Secondary | ICD-10-CM | POA: Diagnosis not present

## 2019-11-04 DIAGNOSIS — Z23 Encounter for immunization: Secondary | ICD-10-CM | POA: Diagnosis not present

## 2019-11-04 DIAGNOSIS — E1129 Type 2 diabetes mellitus with other diabetic kidney complication: Secondary | ICD-10-CM | POA: Diagnosis not present

## 2019-11-04 DIAGNOSIS — D509 Iron deficiency anemia, unspecified: Secondary | ICD-10-CM | POA: Diagnosis not present

## 2019-11-04 DIAGNOSIS — N2581 Secondary hyperparathyroidism of renal origin: Secondary | ICD-10-CM | POA: Diagnosis not present

## 2019-11-04 DIAGNOSIS — Z992 Dependence on renal dialysis: Secondary | ICD-10-CM | POA: Diagnosis not present

## 2019-11-04 DIAGNOSIS — D689 Coagulation defect, unspecified: Secondary | ICD-10-CM | POA: Diagnosis not present

## 2019-11-04 DIAGNOSIS — R52 Pain, unspecified: Secondary | ICD-10-CM | POA: Diagnosis not present

## 2019-11-07 DIAGNOSIS — Z992 Dependence on renal dialysis: Secondary | ICD-10-CM | POA: Diagnosis not present

## 2019-11-07 DIAGNOSIS — E1129 Type 2 diabetes mellitus with other diabetic kidney complication: Secondary | ICD-10-CM | POA: Diagnosis not present

## 2019-11-07 DIAGNOSIS — D631 Anemia in chronic kidney disease: Secondary | ICD-10-CM | POA: Diagnosis not present

## 2019-11-07 DIAGNOSIS — N2581 Secondary hyperparathyroidism of renal origin: Secondary | ICD-10-CM | POA: Diagnosis not present

## 2019-11-07 DIAGNOSIS — D689 Coagulation defect, unspecified: Secondary | ICD-10-CM | POA: Diagnosis not present

## 2019-11-07 DIAGNOSIS — N186 End stage renal disease: Secondary | ICD-10-CM | POA: Diagnosis not present

## 2019-11-07 DIAGNOSIS — D509 Iron deficiency anemia, unspecified: Secondary | ICD-10-CM | POA: Diagnosis not present

## 2019-11-07 DIAGNOSIS — Z23 Encounter for immunization: Secondary | ICD-10-CM | POA: Diagnosis not present

## 2019-11-07 DIAGNOSIS — R52 Pain, unspecified: Secondary | ICD-10-CM | POA: Diagnosis not present

## 2019-11-09 DIAGNOSIS — N186 End stage renal disease: Secondary | ICD-10-CM | POA: Diagnosis not present

## 2019-11-09 DIAGNOSIS — R52 Pain, unspecified: Secondary | ICD-10-CM | POA: Diagnosis not present

## 2019-11-09 DIAGNOSIS — D509 Iron deficiency anemia, unspecified: Secondary | ICD-10-CM | POA: Diagnosis not present

## 2019-11-09 DIAGNOSIS — N2581 Secondary hyperparathyroidism of renal origin: Secondary | ICD-10-CM | POA: Diagnosis not present

## 2019-11-09 DIAGNOSIS — Z992 Dependence on renal dialysis: Secondary | ICD-10-CM | POA: Diagnosis not present

## 2019-11-09 DIAGNOSIS — D631 Anemia in chronic kidney disease: Secondary | ICD-10-CM | POA: Diagnosis not present

## 2019-11-09 DIAGNOSIS — D689 Coagulation defect, unspecified: Secondary | ICD-10-CM | POA: Diagnosis not present

## 2019-11-09 DIAGNOSIS — E1129 Type 2 diabetes mellitus with other diabetic kidney complication: Secondary | ICD-10-CM | POA: Diagnosis not present

## 2019-11-09 DIAGNOSIS — Z23 Encounter for immunization: Secondary | ICD-10-CM | POA: Diagnosis not present

## 2019-11-11 DIAGNOSIS — R52 Pain, unspecified: Secondary | ICD-10-CM | POA: Diagnosis not present

## 2019-11-11 DIAGNOSIS — N2581 Secondary hyperparathyroidism of renal origin: Secondary | ICD-10-CM | POA: Diagnosis not present

## 2019-11-11 DIAGNOSIS — D631 Anemia in chronic kidney disease: Secondary | ICD-10-CM | POA: Diagnosis not present

## 2019-11-11 DIAGNOSIS — Z992 Dependence on renal dialysis: Secondary | ICD-10-CM | POA: Diagnosis not present

## 2019-11-11 DIAGNOSIS — D509 Iron deficiency anemia, unspecified: Secondary | ICD-10-CM | POA: Diagnosis not present

## 2019-11-11 DIAGNOSIS — D689 Coagulation defect, unspecified: Secondary | ICD-10-CM | POA: Diagnosis not present

## 2019-11-11 DIAGNOSIS — E1129 Type 2 diabetes mellitus with other diabetic kidney complication: Secondary | ICD-10-CM | POA: Diagnosis not present

## 2019-11-11 DIAGNOSIS — N186 End stage renal disease: Secondary | ICD-10-CM | POA: Diagnosis not present

## 2019-11-11 DIAGNOSIS — Z23 Encounter for immunization: Secondary | ICD-10-CM | POA: Diagnosis not present

## 2019-11-14 DIAGNOSIS — D509 Iron deficiency anemia, unspecified: Secondary | ICD-10-CM | POA: Diagnosis not present

## 2019-11-14 DIAGNOSIS — Z992 Dependence on renal dialysis: Secondary | ICD-10-CM | POA: Diagnosis not present

## 2019-11-14 DIAGNOSIS — E1129 Type 2 diabetes mellitus with other diabetic kidney complication: Secondary | ICD-10-CM | POA: Diagnosis not present

## 2019-11-14 DIAGNOSIS — N2581 Secondary hyperparathyroidism of renal origin: Secondary | ICD-10-CM | POA: Diagnosis not present

## 2019-11-14 DIAGNOSIS — R52 Pain, unspecified: Secondary | ICD-10-CM | POA: Diagnosis not present

## 2019-11-14 DIAGNOSIS — N186 End stage renal disease: Secondary | ICD-10-CM | POA: Diagnosis not present

## 2019-11-14 DIAGNOSIS — Z23 Encounter for immunization: Secondary | ICD-10-CM | POA: Diagnosis not present

## 2019-11-14 DIAGNOSIS — D631 Anemia in chronic kidney disease: Secondary | ICD-10-CM | POA: Diagnosis not present

## 2019-11-14 DIAGNOSIS — D689 Coagulation defect, unspecified: Secondary | ICD-10-CM | POA: Diagnosis not present

## 2019-11-16 DIAGNOSIS — E1129 Type 2 diabetes mellitus with other diabetic kidney complication: Secondary | ICD-10-CM | POA: Diagnosis not present

## 2019-11-16 DIAGNOSIS — R52 Pain, unspecified: Secondary | ICD-10-CM | POA: Diagnosis not present

## 2019-11-16 DIAGNOSIS — N2581 Secondary hyperparathyroidism of renal origin: Secondary | ICD-10-CM | POA: Diagnosis not present

## 2019-11-16 DIAGNOSIS — Z23 Encounter for immunization: Secondary | ICD-10-CM | POA: Diagnosis not present

## 2019-11-16 DIAGNOSIS — Z992 Dependence on renal dialysis: Secondary | ICD-10-CM | POA: Diagnosis not present

## 2019-11-16 DIAGNOSIS — D689 Coagulation defect, unspecified: Secondary | ICD-10-CM | POA: Diagnosis not present

## 2019-11-16 DIAGNOSIS — D631 Anemia in chronic kidney disease: Secondary | ICD-10-CM | POA: Diagnosis not present

## 2019-11-16 DIAGNOSIS — D509 Iron deficiency anemia, unspecified: Secondary | ICD-10-CM | POA: Diagnosis not present

## 2019-11-16 DIAGNOSIS — N186 End stage renal disease: Secondary | ICD-10-CM | POA: Diagnosis not present

## 2019-11-18 DIAGNOSIS — Z992 Dependence on renal dialysis: Secondary | ICD-10-CM | POA: Diagnosis not present

## 2019-11-18 DIAGNOSIS — R52 Pain, unspecified: Secondary | ICD-10-CM | POA: Diagnosis not present

## 2019-11-18 DIAGNOSIS — D689 Coagulation defect, unspecified: Secondary | ICD-10-CM | POA: Diagnosis not present

## 2019-11-18 DIAGNOSIS — N186 End stage renal disease: Secondary | ICD-10-CM | POA: Diagnosis not present

## 2019-11-18 DIAGNOSIS — D509 Iron deficiency anemia, unspecified: Secondary | ICD-10-CM | POA: Diagnosis not present

## 2019-11-18 DIAGNOSIS — E1129 Type 2 diabetes mellitus with other diabetic kidney complication: Secondary | ICD-10-CM | POA: Diagnosis not present

## 2019-11-18 DIAGNOSIS — D631 Anemia in chronic kidney disease: Secondary | ICD-10-CM | POA: Diagnosis not present

## 2019-11-18 DIAGNOSIS — Z23 Encounter for immunization: Secondary | ICD-10-CM | POA: Diagnosis not present

## 2019-11-18 DIAGNOSIS — N2581 Secondary hyperparathyroidism of renal origin: Secondary | ICD-10-CM | POA: Diagnosis not present

## 2019-11-20 DIAGNOSIS — N186 End stage renal disease: Secondary | ICD-10-CM | POA: Diagnosis not present

## 2019-11-20 DIAGNOSIS — Z992 Dependence on renal dialysis: Secondary | ICD-10-CM | POA: Diagnosis not present

## 2019-11-20 DIAGNOSIS — I129 Hypertensive chronic kidney disease with stage 1 through stage 4 chronic kidney disease, or unspecified chronic kidney disease: Secondary | ICD-10-CM | POA: Diagnosis not present

## 2019-11-21 DIAGNOSIS — N186 End stage renal disease: Secondary | ICD-10-CM | POA: Diagnosis not present

## 2019-11-21 DIAGNOSIS — N2581 Secondary hyperparathyroidism of renal origin: Secondary | ICD-10-CM | POA: Diagnosis not present

## 2019-11-21 DIAGNOSIS — D509 Iron deficiency anemia, unspecified: Secondary | ICD-10-CM | POA: Diagnosis not present

## 2019-11-21 DIAGNOSIS — D631 Anemia in chronic kidney disease: Secondary | ICD-10-CM | POA: Diagnosis not present

## 2019-11-21 DIAGNOSIS — E1129 Type 2 diabetes mellitus with other diabetic kidney complication: Secondary | ICD-10-CM | POA: Diagnosis not present

## 2019-11-21 DIAGNOSIS — Z992 Dependence on renal dialysis: Secondary | ICD-10-CM | POA: Diagnosis not present

## 2019-11-21 DIAGNOSIS — D689 Coagulation defect, unspecified: Secondary | ICD-10-CM | POA: Diagnosis not present

## 2019-11-23 DIAGNOSIS — N2581 Secondary hyperparathyroidism of renal origin: Secondary | ICD-10-CM | POA: Diagnosis not present

## 2019-11-23 DIAGNOSIS — D509 Iron deficiency anemia, unspecified: Secondary | ICD-10-CM | POA: Diagnosis not present

## 2019-11-23 DIAGNOSIS — N186 End stage renal disease: Secondary | ICD-10-CM | POA: Diagnosis not present

## 2019-11-23 DIAGNOSIS — Z992 Dependence on renal dialysis: Secondary | ICD-10-CM | POA: Diagnosis not present

## 2019-11-23 DIAGNOSIS — D631 Anemia in chronic kidney disease: Secondary | ICD-10-CM | POA: Diagnosis not present

## 2019-11-23 DIAGNOSIS — D689 Coagulation defect, unspecified: Secondary | ICD-10-CM | POA: Diagnosis not present

## 2019-11-23 DIAGNOSIS — E1129 Type 2 diabetes mellitus with other diabetic kidney complication: Secondary | ICD-10-CM | POA: Diagnosis not present

## 2019-11-25 DIAGNOSIS — E1129 Type 2 diabetes mellitus with other diabetic kidney complication: Secondary | ICD-10-CM | POA: Diagnosis not present

## 2019-11-25 DIAGNOSIS — N186 End stage renal disease: Secondary | ICD-10-CM | POA: Diagnosis not present

## 2019-11-25 DIAGNOSIS — D509 Iron deficiency anemia, unspecified: Secondary | ICD-10-CM | POA: Diagnosis not present

## 2019-11-25 DIAGNOSIS — D689 Coagulation defect, unspecified: Secondary | ICD-10-CM | POA: Diagnosis not present

## 2019-11-25 DIAGNOSIS — N2581 Secondary hyperparathyroidism of renal origin: Secondary | ICD-10-CM | POA: Diagnosis not present

## 2019-11-25 DIAGNOSIS — Z992 Dependence on renal dialysis: Secondary | ICD-10-CM | POA: Diagnosis not present

## 2019-11-25 DIAGNOSIS — D631 Anemia in chronic kidney disease: Secondary | ICD-10-CM | POA: Diagnosis not present

## 2019-11-28 DIAGNOSIS — D631 Anemia in chronic kidney disease: Secondary | ICD-10-CM | POA: Diagnosis not present

## 2019-11-28 DIAGNOSIS — Z992 Dependence on renal dialysis: Secondary | ICD-10-CM | POA: Diagnosis not present

## 2019-11-28 DIAGNOSIS — E1129 Type 2 diabetes mellitus with other diabetic kidney complication: Secondary | ICD-10-CM | POA: Diagnosis not present

## 2019-11-28 DIAGNOSIS — N186 End stage renal disease: Secondary | ICD-10-CM | POA: Diagnosis not present

## 2019-11-28 DIAGNOSIS — N2581 Secondary hyperparathyroidism of renal origin: Secondary | ICD-10-CM | POA: Diagnosis not present

## 2019-11-28 DIAGNOSIS — D509 Iron deficiency anemia, unspecified: Secondary | ICD-10-CM | POA: Diagnosis not present

## 2019-11-28 DIAGNOSIS — D689 Coagulation defect, unspecified: Secondary | ICD-10-CM | POA: Diagnosis not present

## 2019-11-30 DIAGNOSIS — D689 Coagulation defect, unspecified: Secondary | ICD-10-CM | POA: Diagnosis not present

## 2019-11-30 DIAGNOSIS — E1129 Type 2 diabetes mellitus with other diabetic kidney complication: Secondary | ICD-10-CM | POA: Diagnosis not present

## 2019-11-30 DIAGNOSIS — Z992 Dependence on renal dialysis: Secondary | ICD-10-CM | POA: Diagnosis not present

## 2019-11-30 DIAGNOSIS — N2581 Secondary hyperparathyroidism of renal origin: Secondary | ICD-10-CM | POA: Diagnosis not present

## 2019-11-30 DIAGNOSIS — D631 Anemia in chronic kidney disease: Secondary | ICD-10-CM | POA: Diagnosis not present

## 2019-11-30 DIAGNOSIS — D509 Iron deficiency anemia, unspecified: Secondary | ICD-10-CM | POA: Diagnosis not present

## 2019-11-30 DIAGNOSIS — N186 End stage renal disease: Secondary | ICD-10-CM | POA: Diagnosis not present

## 2019-12-02 DIAGNOSIS — N186 End stage renal disease: Secondary | ICD-10-CM | POA: Diagnosis not present

## 2019-12-02 DIAGNOSIS — Z992 Dependence on renal dialysis: Secondary | ICD-10-CM | POA: Diagnosis not present

## 2019-12-02 DIAGNOSIS — D689 Coagulation defect, unspecified: Secondary | ICD-10-CM | POA: Diagnosis not present

## 2019-12-02 DIAGNOSIS — E1129 Type 2 diabetes mellitus with other diabetic kidney complication: Secondary | ICD-10-CM | POA: Diagnosis not present

## 2019-12-02 DIAGNOSIS — N2581 Secondary hyperparathyroidism of renal origin: Secondary | ICD-10-CM | POA: Diagnosis not present

## 2019-12-02 DIAGNOSIS — D631 Anemia in chronic kidney disease: Secondary | ICD-10-CM | POA: Diagnosis not present

## 2019-12-02 DIAGNOSIS — D509 Iron deficiency anemia, unspecified: Secondary | ICD-10-CM | POA: Diagnosis not present

## 2019-12-05 DIAGNOSIS — D631 Anemia in chronic kidney disease: Secondary | ICD-10-CM | POA: Diagnosis not present

## 2019-12-05 DIAGNOSIS — Z992 Dependence on renal dialysis: Secondary | ICD-10-CM | POA: Diagnosis not present

## 2019-12-05 DIAGNOSIS — E1129 Type 2 diabetes mellitus with other diabetic kidney complication: Secondary | ICD-10-CM | POA: Diagnosis not present

## 2019-12-05 DIAGNOSIS — D509 Iron deficiency anemia, unspecified: Secondary | ICD-10-CM | POA: Diagnosis not present

## 2019-12-05 DIAGNOSIS — N2581 Secondary hyperparathyroidism of renal origin: Secondary | ICD-10-CM | POA: Diagnosis not present

## 2019-12-05 DIAGNOSIS — N186 End stage renal disease: Secondary | ICD-10-CM | POA: Diagnosis not present

## 2019-12-05 DIAGNOSIS — D689 Coagulation defect, unspecified: Secondary | ICD-10-CM | POA: Diagnosis not present

## 2019-12-07 DIAGNOSIS — D689 Coagulation defect, unspecified: Secondary | ICD-10-CM | POA: Diagnosis not present

## 2019-12-07 DIAGNOSIS — Z992 Dependence on renal dialysis: Secondary | ICD-10-CM | POA: Diagnosis not present

## 2019-12-07 DIAGNOSIS — D631 Anemia in chronic kidney disease: Secondary | ICD-10-CM | POA: Diagnosis not present

## 2019-12-07 DIAGNOSIS — D509 Iron deficiency anemia, unspecified: Secondary | ICD-10-CM | POA: Diagnosis not present

## 2019-12-07 DIAGNOSIS — N2581 Secondary hyperparathyroidism of renal origin: Secondary | ICD-10-CM | POA: Diagnosis not present

## 2019-12-07 DIAGNOSIS — N186 End stage renal disease: Secondary | ICD-10-CM | POA: Diagnosis not present

## 2019-12-07 DIAGNOSIS — E1129 Type 2 diabetes mellitus with other diabetic kidney complication: Secondary | ICD-10-CM | POA: Diagnosis not present

## 2019-12-09 DIAGNOSIS — E1129 Type 2 diabetes mellitus with other diabetic kidney complication: Secondary | ICD-10-CM | POA: Diagnosis not present

## 2019-12-09 DIAGNOSIS — N2581 Secondary hyperparathyroidism of renal origin: Secondary | ICD-10-CM | POA: Diagnosis not present

## 2019-12-09 DIAGNOSIS — D631 Anemia in chronic kidney disease: Secondary | ICD-10-CM | POA: Diagnosis not present

## 2019-12-09 DIAGNOSIS — Z992 Dependence on renal dialysis: Secondary | ICD-10-CM | POA: Diagnosis not present

## 2019-12-09 DIAGNOSIS — D509 Iron deficiency anemia, unspecified: Secondary | ICD-10-CM | POA: Diagnosis not present

## 2019-12-09 DIAGNOSIS — D689 Coagulation defect, unspecified: Secondary | ICD-10-CM | POA: Diagnosis not present

## 2019-12-09 DIAGNOSIS — N186 End stage renal disease: Secondary | ICD-10-CM | POA: Diagnosis not present

## 2019-12-11 DIAGNOSIS — E1129 Type 2 diabetes mellitus with other diabetic kidney complication: Secondary | ICD-10-CM | POA: Diagnosis not present

## 2019-12-11 DIAGNOSIS — D631 Anemia in chronic kidney disease: Secondary | ICD-10-CM | POA: Diagnosis not present

## 2019-12-11 DIAGNOSIS — D509 Iron deficiency anemia, unspecified: Secondary | ICD-10-CM | POA: Diagnosis not present

## 2019-12-11 DIAGNOSIS — N2581 Secondary hyperparathyroidism of renal origin: Secondary | ICD-10-CM | POA: Diagnosis not present

## 2019-12-11 DIAGNOSIS — D689 Coagulation defect, unspecified: Secondary | ICD-10-CM | POA: Diagnosis not present

## 2019-12-11 DIAGNOSIS — Z992 Dependence on renal dialysis: Secondary | ICD-10-CM | POA: Diagnosis not present

## 2019-12-11 DIAGNOSIS — N186 End stage renal disease: Secondary | ICD-10-CM | POA: Diagnosis not present

## 2019-12-12 DIAGNOSIS — I10 Essential (primary) hypertension: Secondary | ICD-10-CM | POA: Diagnosis not present

## 2019-12-12 DIAGNOSIS — E785 Hyperlipidemia, unspecified: Secondary | ICD-10-CM | POA: Diagnosis not present

## 2019-12-12 DIAGNOSIS — E1169 Type 2 diabetes mellitus with other specified complication: Secondary | ICD-10-CM | POA: Diagnosis not present

## 2019-12-12 DIAGNOSIS — I129 Hypertensive chronic kidney disease with stage 1 through stage 4 chronic kidney disease, or unspecified chronic kidney disease: Secondary | ICD-10-CM | POA: Diagnosis not present

## 2019-12-13 DIAGNOSIS — N186 End stage renal disease: Secondary | ICD-10-CM | POA: Diagnosis not present

## 2019-12-13 DIAGNOSIS — D631 Anemia in chronic kidney disease: Secondary | ICD-10-CM | POA: Diagnosis not present

## 2019-12-13 DIAGNOSIS — D509 Iron deficiency anemia, unspecified: Secondary | ICD-10-CM | POA: Diagnosis not present

## 2019-12-13 DIAGNOSIS — N2581 Secondary hyperparathyroidism of renal origin: Secondary | ICD-10-CM | POA: Diagnosis not present

## 2019-12-13 DIAGNOSIS — D689 Coagulation defect, unspecified: Secondary | ICD-10-CM | POA: Diagnosis not present

## 2019-12-13 DIAGNOSIS — E1129 Type 2 diabetes mellitus with other diabetic kidney complication: Secondary | ICD-10-CM | POA: Diagnosis not present

## 2019-12-13 DIAGNOSIS — Z992 Dependence on renal dialysis: Secondary | ICD-10-CM | POA: Diagnosis not present

## 2019-12-16 DIAGNOSIS — N186 End stage renal disease: Secondary | ICD-10-CM | POA: Diagnosis not present

## 2019-12-16 DIAGNOSIS — D631 Anemia in chronic kidney disease: Secondary | ICD-10-CM | POA: Diagnosis not present

## 2019-12-16 DIAGNOSIS — Z992 Dependence on renal dialysis: Secondary | ICD-10-CM | POA: Diagnosis not present

## 2019-12-16 DIAGNOSIS — D509 Iron deficiency anemia, unspecified: Secondary | ICD-10-CM | POA: Diagnosis not present

## 2019-12-16 DIAGNOSIS — E1129 Type 2 diabetes mellitus with other diabetic kidney complication: Secondary | ICD-10-CM | POA: Diagnosis not present

## 2019-12-16 DIAGNOSIS — D689 Coagulation defect, unspecified: Secondary | ICD-10-CM | POA: Diagnosis not present

## 2019-12-16 DIAGNOSIS — N2581 Secondary hyperparathyroidism of renal origin: Secondary | ICD-10-CM | POA: Diagnosis not present

## 2019-12-19 DIAGNOSIS — Z992 Dependence on renal dialysis: Secondary | ICD-10-CM | POA: Diagnosis not present

## 2019-12-19 DIAGNOSIS — D689 Coagulation defect, unspecified: Secondary | ICD-10-CM | POA: Diagnosis not present

## 2019-12-19 DIAGNOSIS — N2581 Secondary hyperparathyroidism of renal origin: Secondary | ICD-10-CM | POA: Diagnosis not present

## 2019-12-19 DIAGNOSIS — N186 End stage renal disease: Secondary | ICD-10-CM | POA: Diagnosis not present

## 2019-12-19 DIAGNOSIS — E1129 Type 2 diabetes mellitus with other diabetic kidney complication: Secondary | ICD-10-CM | POA: Diagnosis not present

## 2019-12-19 DIAGNOSIS — D509 Iron deficiency anemia, unspecified: Secondary | ICD-10-CM | POA: Diagnosis not present

## 2019-12-19 DIAGNOSIS — D631 Anemia in chronic kidney disease: Secondary | ICD-10-CM | POA: Diagnosis not present

## 2019-12-20 DIAGNOSIS — N186 End stage renal disease: Secondary | ICD-10-CM | POA: Diagnosis not present

## 2019-12-20 DIAGNOSIS — I129 Hypertensive chronic kidney disease with stage 1 through stage 4 chronic kidney disease, or unspecified chronic kidney disease: Secondary | ICD-10-CM | POA: Diagnosis not present

## 2019-12-20 DIAGNOSIS — Z992 Dependence on renal dialysis: Secondary | ICD-10-CM | POA: Diagnosis not present

## 2019-12-21 DIAGNOSIS — D689 Coagulation defect, unspecified: Secondary | ICD-10-CM | POA: Diagnosis not present

## 2019-12-21 DIAGNOSIS — Z992 Dependence on renal dialysis: Secondary | ICD-10-CM | POA: Diagnosis not present

## 2019-12-21 DIAGNOSIS — N2581 Secondary hyperparathyroidism of renal origin: Secondary | ICD-10-CM | POA: Diagnosis not present

## 2019-12-21 DIAGNOSIS — D509 Iron deficiency anemia, unspecified: Secondary | ICD-10-CM | POA: Diagnosis not present

## 2019-12-21 DIAGNOSIS — E1129 Type 2 diabetes mellitus with other diabetic kidney complication: Secondary | ICD-10-CM | POA: Diagnosis not present

## 2019-12-21 DIAGNOSIS — N186 End stage renal disease: Secondary | ICD-10-CM | POA: Diagnosis not present

## 2019-12-23 DIAGNOSIS — N2581 Secondary hyperparathyroidism of renal origin: Secondary | ICD-10-CM | POA: Diagnosis not present

## 2019-12-23 DIAGNOSIS — E1129 Type 2 diabetes mellitus with other diabetic kidney complication: Secondary | ICD-10-CM | POA: Diagnosis not present

## 2019-12-23 DIAGNOSIS — Z992 Dependence on renal dialysis: Secondary | ICD-10-CM | POA: Diagnosis not present

## 2019-12-23 DIAGNOSIS — D689 Coagulation defect, unspecified: Secondary | ICD-10-CM | POA: Diagnosis not present

## 2019-12-23 DIAGNOSIS — D509 Iron deficiency anemia, unspecified: Secondary | ICD-10-CM | POA: Diagnosis not present

## 2019-12-23 DIAGNOSIS — N186 End stage renal disease: Secondary | ICD-10-CM | POA: Diagnosis not present

## 2019-12-26 DIAGNOSIS — N186 End stage renal disease: Secondary | ICD-10-CM | POA: Diagnosis not present

## 2019-12-26 DIAGNOSIS — D509 Iron deficiency anemia, unspecified: Secondary | ICD-10-CM | POA: Diagnosis not present

## 2019-12-26 DIAGNOSIS — D689 Coagulation defect, unspecified: Secondary | ICD-10-CM | POA: Diagnosis not present

## 2019-12-26 DIAGNOSIS — N2581 Secondary hyperparathyroidism of renal origin: Secondary | ICD-10-CM | POA: Diagnosis not present

## 2019-12-26 DIAGNOSIS — E1129 Type 2 diabetes mellitus with other diabetic kidney complication: Secondary | ICD-10-CM | POA: Diagnosis not present

## 2019-12-26 DIAGNOSIS — Z992 Dependence on renal dialysis: Secondary | ICD-10-CM | POA: Diagnosis not present

## 2019-12-28 DIAGNOSIS — N186 End stage renal disease: Secondary | ICD-10-CM | POA: Diagnosis not present

## 2019-12-28 DIAGNOSIS — N2581 Secondary hyperparathyroidism of renal origin: Secondary | ICD-10-CM | POA: Diagnosis not present

## 2019-12-28 DIAGNOSIS — Z992 Dependence on renal dialysis: Secondary | ICD-10-CM | POA: Diagnosis not present

## 2019-12-28 DIAGNOSIS — D689 Coagulation defect, unspecified: Secondary | ICD-10-CM | POA: Diagnosis not present

## 2019-12-28 DIAGNOSIS — E1129 Type 2 diabetes mellitus with other diabetic kidney complication: Secondary | ICD-10-CM | POA: Diagnosis not present

## 2019-12-28 DIAGNOSIS — D509 Iron deficiency anemia, unspecified: Secondary | ICD-10-CM | POA: Diagnosis not present

## 2019-12-30 DIAGNOSIS — D689 Coagulation defect, unspecified: Secondary | ICD-10-CM | POA: Diagnosis not present

## 2019-12-30 DIAGNOSIS — E1129 Type 2 diabetes mellitus with other diabetic kidney complication: Secondary | ICD-10-CM | POA: Diagnosis not present

## 2019-12-30 DIAGNOSIS — N186 End stage renal disease: Secondary | ICD-10-CM | POA: Diagnosis not present

## 2019-12-30 DIAGNOSIS — D509 Iron deficiency anemia, unspecified: Secondary | ICD-10-CM | POA: Diagnosis not present

## 2019-12-30 DIAGNOSIS — N2581 Secondary hyperparathyroidism of renal origin: Secondary | ICD-10-CM | POA: Diagnosis not present

## 2019-12-30 DIAGNOSIS — Z992 Dependence on renal dialysis: Secondary | ICD-10-CM | POA: Diagnosis not present

## 2020-01-02 DIAGNOSIS — E1129 Type 2 diabetes mellitus with other diabetic kidney complication: Secondary | ICD-10-CM | POA: Diagnosis not present

## 2020-01-02 DIAGNOSIS — N186 End stage renal disease: Secondary | ICD-10-CM | POA: Diagnosis not present

## 2020-01-02 DIAGNOSIS — D509 Iron deficiency anemia, unspecified: Secondary | ICD-10-CM | POA: Diagnosis not present

## 2020-01-02 DIAGNOSIS — Z992 Dependence on renal dialysis: Secondary | ICD-10-CM | POA: Diagnosis not present

## 2020-01-02 DIAGNOSIS — N2581 Secondary hyperparathyroidism of renal origin: Secondary | ICD-10-CM | POA: Diagnosis not present

## 2020-01-02 DIAGNOSIS — D689 Coagulation defect, unspecified: Secondary | ICD-10-CM | POA: Diagnosis not present

## 2020-01-06 DIAGNOSIS — N2581 Secondary hyperparathyroidism of renal origin: Secondary | ICD-10-CM | POA: Diagnosis not present

## 2020-01-06 DIAGNOSIS — N186 End stage renal disease: Secondary | ICD-10-CM | POA: Diagnosis not present

## 2020-01-06 DIAGNOSIS — E1129 Type 2 diabetes mellitus with other diabetic kidney complication: Secondary | ICD-10-CM | POA: Diagnosis not present

## 2020-01-06 DIAGNOSIS — D689 Coagulation defect, unspecified: Secondary | ICD-10-CM | POA: Diagnosis not present

## 2020-01-06 DIAGNOSIS — Z992 Dependence on renal dialysis: Secondary | ICD-10-CM | POA: Diagnosis not present

## 2020-01-06 DIAGNOSIS — D509 Iron deficiency anemia, unspecified: Secondary | ICD-10-CM | POA: Diagnosis not present

## 2020-01-09 DIAGNOSIS — N2581 Secondary hyperparathyroidism of renal origin: Secondary | ICD-10-CM | POA: Diagnosis not present

## 2020-01-09 DIAGNOSIS — D509 Iron deficiency anemia, unspecified: Secondary | ICD-10-CM | POA: Diagnosis not present

## 2020-01-09 DIAGNOSIS — D689 Coagulation defect, unspecified: Secondary | ICD-10-CM | POA: Diagnosis not present

## 2020-01-09 DIAGNOSIS — Z992 Dependence on renal dialysis: Secondary | ICD-10-CM | POA: Diagnosis not present

## 2020-01-09 DIAGNOSIS — N186 End stage renal disease: Secondary | ICD-10-CM | POA: Diagnosis not present

## 2020-01-09 DIAGNOSIS — E1129 Type 2 diabetes mellitus with other diabetic kidney complication: Secondary | ICD-10-CM | POA: Diagnosis not present

## 2020-01-11 DIAGNOSIS — Z992 Dependence on renal dialysis: Secondary | ICD-10-CM | POA: Diagnosis not present

## 2020-01-11 DIAGNOSIS — N186 End stage renal disease: Secondary | ICD-10-CM | POA: Diagnosis not present

## 2020-01-11 DIAGNOSIS — E1129 Type 2 diabetes mellitus with other diabetic kidney complication: Secondary | ICD-10-CM | POA: Diagnosis not present

## 2020-01-11 DIAGNOSIS — N2581 Secondary hyperparathyroidism of renal origin: Secondary | ICD-10-CM | POA: Diagnosis not present

## 2020-01-11 DIAGNOSIS — D689 Coagulation defect, unspecified: Secondary | ICD-10-CM | POA: Diagnosis not present

## 2020-01-11 DIAGNOSIS — D509 Iron deficiency anemia, unspecified: Secondary | ICD-10-CM | POA: Diagnosis not present

## 2020-01-13 DIAGNOSIS — E1129 Type 2 diabetes mellitus with other diabetic kidney complication: Secondary | ICD-10-CM | POA: Diagnosis not present

## 2020-01-13 DIAGNOSIS — N2581 Secondary hyperparathyroidism of renal origin: Secondary | ICD-10-CM | POA: Diagnosis not present

## 2020-01-13 DIAGNOSIS — D689 Coagulation defect, unspecified: Secondary | ICD-10-CM | POA: Diagnosis not present

## 2020-01-13 DIAGNOSIS — Z992 Dependence on renal dialysis: Secondary | ICD-10-CM | POA: Diagnosis not present

## 2020-01-13 DIAGNOSIS — N186 End stage renal disease: Secondary | ICD-10-CM | POA: Diagnosis not present

## 2020-01-13 DIAGNOSIS — D509 Iron deficiency anemia, unspecified: Secondary | ICD-10-CM | POA: Diagnosis not present

## 2020-01-16 DIAGNOSIS — D689 Coagulation defect, unspecified: Secondary | ICD-10-CM | POA: Diagnosis not present

## 2020-01-16 DIAGNOSIS — Z992 Dependence on renal dialysis: Secondary | ICD-10-CM | POA: Diagnosis not present

## 2020-01-16 DIAGNOSIS — N186 End stage renal disease: Secondary | ICD-10-CM | POA: Diagnosis not present

## 2020-01-16 DIAGNOSIS — E1129 Type 2 diabetes mellitus with other diabetic kidney complication: Secondary | ICD-10-CM | POA: Diagnosis not present

## 2020-01-16 DIAGNOSIS — N2581 Secondary hyperparathyroidism of renal origin: Secondary | ICD-10-CM | POA: Diagnosis not present

## 2020-01-16 DIAGNOSIS — D509 Iron deficiency anemia, unspecified: Secondary | ICD-10-CM | POA: Diagnosis not present

## 2020-01-20 DIAGNOSIS — N2581 Secondary hyperparathyroidism of renal origin: Secondary | ICD-10-CM | POA: Diagnosis not present

## 2020-01-20 DIAGNOSIS — Z992 Dependence on renal dialysis: Secondary | ICD-10-CM | POA: Diagnosis not present

## 2020-01-20 DIAGNOSIS — I129 Hypertensive chronic kidney disease with stage 1 through stage 4 chronic kidney disease, or unspecified chronic kidney disease: Secondary | ICD-10-CM | POA: Diagnosis not present

## 2020-01-20 DIAGNOSIS — D509 Iron deficiency anemia, unspecified: Secondary | ICD-10-CM | POA: Diagnosis not present

## 2020-01-20 DIAGNOSIS — D689 Coagulation defect, unspecified: Secondary | ICD-10-CM | POA: Diagnosis not present

## 2020-01-20 DIAGNOSIS — N186 End stage renal disease: Secondary | ICD-10-CM | POA: Diagnosis not present

## 2020-01-20 DIAGNOSIS — E1129 Type 2 diabetes mellitus with other diabetic kidney complication: Secondary | ICD-10-CM | POA: Diagnosis not present

## 2020-01-23 DIAGNOSIS — N2581 Secondary hyperparathyroidism of renal origin: Secondary | ICD-10-CM | POA: Diagnosis not present

## 2020-01-23 DIAGNOSIS — D689 Coagulation defect, unspecified: Secondary | ICD-10-CM | POA: Diagnosis not present

## 2020-01-23 DIAGNOSIS — Z992 Dependence on renal dialysis: Secondary | ICD-10-CM | POA: Diagnosis not present

## 2020-01-23 DIAGNOSIS — E1129 Type 2 diabetes mellitus with other diabetic kidney complication: Secondary | ICD-10-CM | POA: Diagnosis not present

## 2020-01-23 DIAGNOSIS — D509 Iron deficiency anemia, unspecified: Secondary | ICD-10-CM | POA: Diagnosis not present

## 2020-01-23 DIAGNOSIS — N186 End stage renal disease: Secondary | ICD-10-CM | POA: Diagnosis not present

## 2020-01-25 DIAGNOSIS — D689 Coagulation defect, unspecified: Secondary | ICD-10-CM | POA: Diagnosis not present

## 2020-01-25 DIAGNOSIS — N2581 Secondary hyperparathyroidism of renal origin: Secondary | ICD-10-CM | POA: Diagnosis not present

## 2020-01-25 DIAGNOSIS — Z992 Dependence on renal dialysis: Secondary | ICD-10-CM | POA: Diagnosis not present

## 2020-01-25 DIAGNOSIS — E1129 Type 2 diabetes mellitus with other diabetic kidney complication: Secondary | ICD-10-CM | POA: Diagnosis not present

## 2020-01-25 DIAGNOSIS — D509 Iron deficiency anemia, unspecified: Secondary | ICD-10-CM | POA: Diagnosis not present

## 2020-01-25 DIAGNOSIS — N186 End stage renal disease: Secondary | ICD-10-CM | POA: Diagnosis not present

## 2020-01-27 DIAGNOSIS — Z992 Dependence on renal dialysis: Secondary | ICD-10-CM | POA: Diagnosis not present

## 2020-01-27 DIAGNOSIS — N2581 Secondary hyperparathyroidism of renal origin: Secondary | ICD-10-CM | POA: Diagnosis not present

## 2020-01-27 DIAGNOSIS — D689 Coagulation defect, unspecified: Secondary | ICD-10-CM | POA: Diagnosis not present

## 2020-01-27 DIAGNOSIS — E1129 Type 2 diabetes mellitus with other diabetic kidney complication: Secondary | ICD-10-CM | POA: Diagnosis not present

## 2020-01-27 DIAGNOSIS — N186 End stage renal disease: Secondary | ICD-10-CM | POA: Diagnosis not present

## 2020-01-27 DIAGNOSIS — D509 Iron deficiency anemia, unspecified: Secondary | ICD-10-CM | POA: Diagnosis not present

## 2020-01-30 DIAGNOSIS — D509 Iron deficiency anemia, unspecified: Secondary | ICD-10-CM | POA: Diagnosis not present

## 2020-01-30 DIAGNOSIS — N186 End stage renal disease: Secondary | ICD-10-CM | POA: Diagnosis not present

## 2020-01-30 DIAGNOSIS — N2581 Secondary hyperparathyroidism of renal origin: Secondary | ICD-10-CM | POA: Diagnosis not present

## 2020-01-30 DIAGNOSIS — Z992 Dependence on renal dialysis: Secondary | ICD-10-CM | POA: Diagnosis not present

## 2020-01-30 DIAGNOSIS — E1129 Type 2 diabetes mellitus with other diabetic kidney complication: Secondary | ICD-10-CM | POA: Diagnosis not present

## 2020-01-30 DIAGNOSIS — D689 Coagulation defect, unspecified: Secondary | ICD-10-CM | POA: Diagnosis not present

## 2020-02-01 DIAGNOSIS — D509 Iron deficiency anemia, unspecified: Secondary | ICD-10-CM | POA: Diagnosis not present

## 2020-02-01 DIAGNOSIS — Z992 Dependence on renal dialysis: Secondary | ICD-10-CM | POA: Diagnosis not present

## 2020-02-01 DIAGNOSIS — N186 End stage renal disease: Secondary | ICD-10-CM | POA: Diagnosis not present

## 2020-02-01 DIAGNOSIS — N2581 Secondary hyperparathyroidism of renal origin: Secondary | ICD-10-CM | POA: Diagnosis not present

## 2020-02-01 DIAGNOSIS — E1129 Type 2 diabetes mellitus with other diabetic kidney complication: Secondary | ICD-10-CM | POA: Diagnosis not present

## 2020-02-01 DIAGNOSIS — D689 Coagulation defect, unspecified: Secondary | ICD-10-CM | POA: Diagnosis not present

## 2020-02-02 DIAGNOSIS — Z1152 Encounter for screening for COVID-19: Secondary | ICD-10-CM | POA: Diagnosis not present

## 2020-02-03 DIAGNOSIS — N186 End stage renal disease: Secondary | ICD-10-CM | POA: Diagnosis not present

## 2020-02-03 DIAGNOSIS — Z992 Dependence on renal dialysis: Secondary | ICD-10-CM | POA: Diagnosis not present

## 2020-02-03 DIAGNOSIS — N2581 Secondary hyperparathyroidism of renal origin: Secondary | ICD-10-CM | POA: Diagnosis not present

## 2020-02-03 DIAGNOSIS — D689 Coagulation defect, unspecified: Secondary | ICD-10-CM | POA: Diagnosis not present

## 2020-02-03 DIAGNOSIS — D509 Iron deficiency anemia, unspecified: Secondary | ICD-10-CM | POA: Diagnosis not present

## 2020-02-03 DIAGNOSIS — E1129 Type 2 diabetes mellitus with other diabetic kidney complication: Secondary | ICD-10-CM | POA: Diagnosis not present

## 2020-02-08 DIAGNOSIS — N186 End stage renal disease: Secondary | ICD-10-CM | POA: Diagnosis not present

## 2020-02-08 DIAGNOSIS — Z992 Dependence on renal dialysis: Secondary | ICD-10-CM | POA: Diagnosis not present

## 2020-02-08 DIAGNOSIS — N2581 Secondary hyperparathyroidism of renal origin: Secondary | ICD-10-CM | POA: Diagnosis not present

## 2020-02-08 DIAGNOSIS — E1129 Type 2 diabetes mellitus with other diabetic kidney complication: Secondary | ICD-10-CM | POA: Diagnosis not present

## 2020-02-08 DIAGNOSIS — D689 Coagulation defect, unspecified: Secondary | ICD-10-CM | POA: Diagnosis not present

## 2020-02-08 DIAGNOSIS — D509 Iron deficiency anemia, unspecified: Secondary | ICD-10-CM | POA: Diagnosis not present

## 2020-02-09 DIAGNOSIS — E0840 Diabetes mellitus due to underlying condition with diabetic neuropathy, unspecified: Secondary | ICD-10-CM | POA: Diagnosis not present

## 2020-02-10 DIAGNOSIS — D689 Coagulation defect, unspecified: Secondary | ICD-10-CM | POA: Diagnosis not present

## 2020-02-10 DIAGNOSIS — Z992 Dependence on renal dialysis: Secondary | ICD-10-CM | POA: Diagnosis not present

## 2020-02-10 DIAGNOSIS — E1129 Type 2 diabetes mellitus with other diabetic kidney complication: Secondary | ICD-10-CM | POA: Diagnosis not present

## 2020-02-10 DIAGNOSIS — N186 End stage renal disease: Secondary | ICD-10-CM | POA: Diagnosis not present

## 2020-02-10 DIAGNOSIS — D509 Iron deficiency anemia, unspecified: Secondary | ICD-10-CM | POA: Diagnosis not present

## 2020-02-10 DIAGNOSIS — N2581 Secondary hyperparathyroidism of renal origin: Secondary | ICD-10-CM | POA: Diagnosis not present

## 2020-02-13 DIAGNOSIS — E1129 Type 2 diabetes mellitus with other diabetic kidney complication: Secondary | ICD-10-CM | POA: Diagnosis not present

## 2020-02-13 DIAGNOSIS — Z992 Dependence on renal dialysis: Secondary | ICD-10-CM | POA: Diagnosis not present

## 2020-02-13 DIAGNOSIS — D689 Coagulation defect, unspecified: Secondary | ICD-10-CM | POA: Diagnosis not present

## 2020-02-13 DIAGNOSIS — N2581 Secondary hyperparathyroidism of renal origin: Secondary | ICD-10-CM | POA: Diagnosis not present

## 2020-02-13 DIAGNOSIS — D509 Iron deficiency anemia, unspecified: Secondary | ICD-10-CM | POA: Diagnosis not present

## 2020-02-13 DIAGNOSIS — N186 End stage renal disease: Secondary | ICD-10-CM | POA: Diagnosis not present

## 2020-02-17 DIAGNOSIS — D509 Iron deficiency anemia, unspecified: Secondary | ICD-10-CM | POA: Diagnosis not present

## 2020-02-17 DIAGNOSIS — N2581 Secondary hyperparathyroidism of renal origin: Secondary | ICD-10-CM | POA: Diagnosis not present

## 2020-02-17 DIAGNOSIS — D689 Coagulation defect, unspecified: Secondary | ICD-10-CM | POA: Diagnosis not present

## 2020-02-17 DIAGNOSIS — Z992 Dependence on renal dialysis: Secondary | ICD-10-CM | POA: Diagnosis not present

## 2020-02-17 DIAGNOSIS — E1129 Type 2 diabetes mellitus with other diabetic kidney complication: Secondary | ICD-10-CM | POA: Diagnosis not present

## 2020-02-17 DIAGNOSIS — N186 End stage renal disease: Secondary | ICD-10-CM | POA: Diagnosis not present

## 2020-02-20 DIAGNOSIS — N186 End stage renal disease: Secondary | ICD-10-CM | POA: Diagnosis not present

## 2020-02-20 DIAGNOSIS — D689 Coagulation defect, unspecified: Secondary | ICD-10-CM | POA: Diagnosis not present

## 2020-02-20 DIAGNOSIS — E1129 Type 2 diabetes mellitus with other diabetic kidney complication: Secondary | ICD-10-CM | POA: Diagnosis not present

## 2020-02-20 DIAGNOSIS — N2581 Secondary hyperparathyroidism of renal origin: Secondary | ICD-10-CM | POA: Diagnosis not present

## 2020-02-20 DIAGNOSIS — I129 Hypertensive chronic kidney disease with stage 1 through stage 4 chronic kidney disease, or unspecified chronic kidney disease: Secondary | ICD-10-CM | POA: Diagnosis not present

## 2020-02-20 DIAGNOSIS — Z992 Dependence on renal dialysis: Secondary | ICD-10-CM | POA: Diagnosis not present

## 2020-02-20 DIAGNOSIS — D509 Iron deficiency anemia, unspecified: Secondary | ICD-10-CM | POA: Diagnosis not present

## 2020-02-22 DIAGNOSIS — E1129 Type 2 diabetes mellitus with other diabetic kidney complication: Secondary | ICD-10-CM | POA: Diagnosis not present

## 2020-02-22 DIAGNOSIS — D509 Iron deficiency anemia, unspecified: Secondary | ICD-10-CM | POA: Diagnosis not present

## 2020-02-22 DIAGNOSIS — Z992 Dependence on renal dialysis: Secondary | ICD-10-CM | POA: Diagnosis not present

## 2020-02-22 DIAGNOSIS — D689 Coagulation defect, unspecified: Secondary | ICD-10-CM | POA: Diagnosis not present

## 2020-02-22 DIAGNOSIS — N2581 Secondary hyperparathyroidism of renal origin: Secondary | ICD-10-CM | POA: Diagnosis not present

## 2020-02-22 DIAGNOSIS — N186 End stage renal disease: Secondary | ICD-10-CM | POA: Diagnosis not present

## 2020-02-24 DIAGNOSIS — Z992 Dependence on renal dialysis: Secondary | ICD-10-CM | POA: Diagnosis not present

## 2020-02-24 DIAGNOSIS — N2581 Secondary hyperparathyroidism of renal origin: Secondary | ICD-10-CM | POA: Diagnosis not present

## 2020-02-24 DIAGNOSIS — D509 Iron deficiency anemia, unspecified: Secondary | ICD-10-CM | POA: Diagnosis not present

## 2020-02-24 DIAGNOSIS — D689 Coagulation defect, unspecified: Secondary | ICD-10-CM | POA: Diagnosis not present

## 2020-02-24 DIAGNOSIS — E1129 Type 2 diabetes mellitus with other diabetic kidney complication: Secondary | ICD-10-CM | POA: Diagnosis not present

## 2020-02-24 DIAGNOSIS — N186 End stage renal disease: Secondary | ICD-10-CM | POA: Diagnosis not present

## 2020-02-29 DIAGNOSIS — D689 Coagulation defect, unspecified: Secondary | ICD-10-CM | POA: Diagnosis not present

## 2020-02-29 DIAGNOSIS — Z992 Dependence on renal dialysis: Secondary | ICD-10-CM | POA: Diagnosis not present

## 2020-02-29 DIAGNOSIS — N186 End stage renal disease: Secondary | ICD-10-CM | POA: Diagnosis not present

## 2020-02-29 DIAGNOSIS — D509 Iron deficiency anemia, unspecified: Secondary | ICD-10-CM | POA: Diagnosis not present

## 2020-02-29 DIAGNOSIS — E1129 Type 2 diabetes mellitus with other diabetic kidney complication: Secondary | ICD-10-CM | POA: Diagnosis not present

## 2020-02-29 DIAGNOSIS — N2581 Secondary hyperparathyroidism of renal origin: Secondary | ICD-10-CM | POA: Diagnosis not present

## 2020-03-02 DIAGNOSIS — Z992 Dependence on renal dialysis: Secondary | ICD-10-CM | POA: Diagnosis not present

## 2020-03-02 DIAGNOSIS — N186 End stage renal disease: Secondary | ICD-10-CM | POA: Diagnosis not present

## 2020-03-02 DIAGNOSIS — E1129 Type 2 diabetes mellitus with other diabetic kidney complication: Secondary | ICD-10-CM | POA: Diagnosis not present

## 2020-03-02 DIAGNOSIS — D689 Coagulation defect, unspecified: Secondary | ICD-10-CM | POA: Diagnosis not present

## 2020-03-02 DIAGNOSIS — D509 Iron deficiency anemia, unspecified: Secondary | ICD-10-CM | POA: Diagnosis not present

## 2020-03-02 DIAGNOSIS — N2581 Secondary hyperparathyroidism of renal origin: Secondary | ICD-10-CM | POA: Diagnosis not present

## 2020-03-05 DIAGNOSIS — Z992 Dependence on renal dialysis: Secondary | ICD-10-CM | POA: Diagnosis not present

## 2020-03-05 DIAGNOSIS — N2581 Secondary hyperparathyroidism of renal origin: Secondary | ICD-10-CM | POA: Diagnosis not present

## 2020-03-05 DIAGNOSIS — D509 Iron deficiency anemia, unspecified: Secondary | ICD-10-CM | POA: Diagnosis not present

## 2020-03-05 DIAGNOSIS — N186 End stage renal disease: Secondary | ICD-10-CM | POA: Diagnosis not present

## 2020-03-05 DIAGNOSIS — E1129 Type 2 diabetes mellitus with other diabetic kidney complication: Secondary | ICD-10-CM | POA: Diagnosis not present

## 2020-03-05 DIAGNOSIS — D689 Coagulation defect, unspecified: Secondary | ICD-10-CM | POA: Diagnosis not present

## 2020-03-07 DIAGNOSIS — D689 Coagulation defect, unspecified: Secondary | ICD-10-CM | POA: Diagnosis not present

## 2020-03-07 DIAGNOSIS — N2581 Secondary hyperparathyroidism of renal origin: Secondary | ICD-10-CM | POA: Diagnosis not present

## 2020-03-07 DIAGNOSIS — D509 Iron deficiency anemia, unspecified: Secondary | ICD-10-CM | POA: Diagnosis not present

## 2020-03-07 DIAGNOSIS — Z992 Dependence on renal dialysis: Secondary | ICD-10-CM | POA: Diagnosis not present

## 2020-03-07 DIAGNOSIS — E1129 Type 2 diabetes mellitus with other diabetic kidney complication: Secondary | ICD-10-CM | POA: Diagnosis not present

## 2020-03-07 DIAGNOSIS — N186 End stage renal disease: Secondary | ICD-10-CM | POA: Diagnosis not present

## 2020-03-09 DIAGNOSIS — D509 Iron deficiency anemia, unspecified: Secondary | ICD-10-CM | POA: Diagnosis not present

## 2020-03-09 DIAGNOSIS — N2581 Secondary hyperparathyroidism of renal origin: Secondary | ICD-10-CM | POA: Diagnosis not present

## 2020-03-09 DIAGNOSIS — N186 End stage renal disease: Secondary | ICD-10-CM | POA: Diagnosis not present

## 2020-03-09 DIAGNOSIS — E1129 Type 2 diabetes mellitus with other diabetic kidney complication: Secondary | ICD-10-CM | POA: Diagnosis not present

## 2020-03-09 DIAGNOSIS — Z992 Dependence on renal dialysis: Secondary | ICD-10-CM | POA: Diagnosis not present

## 2020-03-09 DIAGNOSIS — D689 Coagulation defect, unspecified: Secondary | ICD-10-CM | POA: Diagnosis not present

## 2020-03-12 DIAGNOSIS — N2581 Secondary hyperparathyroidism of renal origin: Secondary | ICD-10-CM | POA: Diagnosis not present

## 2020-03-12 DIAGNOSIS — N186 End stage renal disease: Secondary | ICD-10-CM | POA: Diagnosis not present

## 2020-03-12 DIAGNOSIS — D689 Coagulation defect, unspecified: Secondary | ICD-10-CM | POA: Diagnosis not present

## 2020-03-12 DIAGNOSIS — E1129 Type 2 diabetes mellitus with other diabetic kidney complication: Secondary | ICD-10-CM | POA: Diagnosis not present

## 2020-03-12 DIAGNOSIS — D509 Iron deficiency anemia, unspecified: Secondary | ICD-10-CM | POA: Diagnosis not present

## 2020-03-12 DIAGNOSIS — Z992 Dependence on renal dialysis: Secondary | ICD-10-CM | POA: Diagnosis not present

## 2020-03-14 DIAGNOSIS — D509 Iron deficiency anemia, unspecified: Secondary | ICD-10-CM | POA: Diagnosis not present

## 2020-03-14 DIAGNOSIS — E1129 Type 2 diabetes mellitus with other diabetic kidney complication: Secondary | ICD-10-CM | POA: Diagnosis not present

## 2020-03-14 DIAGNOSIS — Z992 Dependence on renal dialysis: Secondary | ICD-10-CM | POA: Diagnosis not present

## 2020-03-14 DIAGNOSIS — D689 Coagulation defect, unspecified: Secondary | ICD-10-CM | POA: Diagnosis not present

## 2020-03-14 DIAGNOSIS — N2581 Secondary hyperparathyroidism of renal origin: Secondary | ICD-10-CM | POA: Diagnosis not present

## 2020-03-14 DIAGNOSIS — N186 End stage renal disease: Secondary | ICD-10-CM | POA: Diagnosis not present

## 2020-03-16 DIAGNOSIS — E1129 Type 2 diabetes mellitus with other diabetic kidney complication: Secondary | ICD-10-CM | POA: Diagnosis not present

## 2020-03-16 DIAGNOSIS — D509 Iron deficiency anemia, unspecified: Secondary | ICD-10-CM | POA: Diagnosis not present

## 2020-03-16 DIAGNOSIS — N2581 Secondary hyperparathyroidism of renal origin: Secondary | ICD-10-CM | POA: Diagnosis not present

## 2020-03-16 DIAGNOSIS — Z992 Dependence on renal dialysis: Secondary | ICD-10-CM | POA: Diagnosis not present

## 2020-03-16 DIAGNOSIS — D689 Coagulation defect, unspecified: Secondary | ICD-10-CM | POA: Diagnosis not present

## 2020-03-16 DIAGNOSIS — N186 End stage renal disease: Secondary | ICD-10-CM | POA: Diagnosis not present

## 2020-03-19 DIAGNOSIS — D689 Coagulation defect, unspecified: Secondary | ICD-10-CM | POA: Diagnosis not present

## 2020-03-19 DIAGNOSIS — D509 Iron deficiency anemia, unspecified: Secondary | ICD-10-CM | POA: Diagnosis not present

## 2020-03-19 DIAGNOSIS — E1129 Type 2 diabetes mellitus with other diabetic kidney complication: Secondary | ICD-10-CM | POA: Diagnosis not present

## 2020-03-19 DIAGNOSIS — N186 End stage renal disease: Secondary | ICD-10-CM | POA: Diagnosis not present

## 2020-03-19 DIAGNOSIS — I129 Hypertensive chronic kidney disease with stage 1 through stage 4 chronic kidney disease, or unspecified chronic kidney disease: Secondary | ICD-10-CM | POA: Diagnosis not present

## 2020-03-19 DIAGNOSIS — Z992 Dependence on renal dialysis: Secondary | ICD-10-CM | POA: Diagnosis not present

## 2020-03-19 DIAGNOSIS — N2581 Secondary hyperparathyroidism of renal origin: Secondary | ICD-10-CM | POA: Diagnosis not present

## 2020-03-21 DIAGNOSIS — N186 End stage renal disease: Secondary | ICD-10-CM | POA: Diagnosis not present

## 2020-03-21 DIAGNOSIS — D689 Coagulation defect, unspecified: Secondary | ICD-10-CM | POA: Diagnosis not present

## 2020-03-21 DIAGNOSIS — N2581 Secondary hyperparathyroidism of renal origin: Secondary | ICD-10-CM | POA: Diagnosis not present

## 2020-03-21 DIAGNOSIS — Z992 Dependence on renal dialysis: Secondary | ICD-10-CM | POA: Diagnosis not present

## 2020-03-23 DIAGNOSIS — D689 Coagulation defect, unspecified: Secondary | ICD-10-CM | POA: Diagnosis not present

## 2020-03-23 DIAGNOSIS — Z992 Dependence on renal dialysis: Secondary | ICD-10-CM | POA: Diagnosis not present

## 2020-03-23 DIAGNOSIS — N186 End stage renal disease: Secondary | ICD-10-CM | POA: Diagnosis not present

## 2020-03-23 DIAGNOSIS — N2581 Secondary hyperparathyroidism of renal origin: Secondary | ICD-10-CM | POA: Diagnosis not present

## 2020-03-26 DIAGNOSIS — Z992 Dependence on renal dialysis: Secondary | ICD-10-CM | POA: Diagnosis not present

## 2020-03-26 DIAGNOSIS — N2581 Secondary hyperparathyroidism of renal origin: Secondary | ICD-10-CM | POA: Diagnosis not present

## 2020-03-26 DIAGNOSIS — D689 Coagulation defect, unspecified: Secondary | ICD-10-CM | POA: Diagnosis not present

## 2020-03-26 DIAGNOSIS — N186 End stage renal disease: Secondary | ICD-10-CM | POA: Diagnosis not present

## 2020-03-30 DIAGNOSIS — Z992 Dependence on renal dialysis: Secondary | ICD-10-CM | POA: Diagnosis not present

## 2020-03-30 DIAGNOSIS — N2581 Secondary hyperparathyroidism of renal origin: Secondary | ICD-10-CM | POA: Diagnosis not present

## 2020-03-30 DIAGNOSIS — N186 End stage renal disease: Secondary | ICD-10-CM | POA: Diagnosis not present

## 2020-03-30 DIAGNOSIS — D689 Coagulation defect, unspecified: Secondary | ICD-10-CM | POA: Diagnosis not present

## 2020-04-02 DIAGNOSIS — N186 End stage renal disease: Secondary | ICD-10-CM | POA: Diagnosis not present

## 2020-04-02 DIAGNOSIS — N2581 Secondary hyperparathyroidism of renal origin: Secondary | ICD-10-CM | POA: Diagnosis not present

## 2020-04-02 DIAGNOSIS — D689 Coagulation defect, unspecified: Secondary | ICD-10-CM | POA: Diagnosis not present

## 2020-04-02 DIAGNOSIS — Z992 Dependence on renal dialysis: Secondary | ICD-10-CM | POA: Diagnosis not present

## 2020-04-04 DIAGNOSIS — Z992 Dependence on renal dialysis: Secondary | ICD-10-CM | POA: Diagnosis not present

## 2020-04-04 DIAGNOSIS — N2581 Secondary hyperparathyroidism of renal origin: Secondary | ICD-10-CM | POA: Diagnosis not present

## 2020-04-04 DIAGNOSIS — D689 Coagulation defect, unspecified: Secondary | ICD-10-CM | POA: Diagnosis not present

## 2020-04-04 DIAGNOSIS — N186 End stage renal disease: Secondary | ICD-10-CM | POA: Diagnosis not present

## 2020-04-06 DIAGNOSIS — N186 End stage renal disease: Secondary | ICD-10-CM | POA: Diagnosis not present

## 2020-04-06 DIAGNOSIS — Z992 Dependence on renal dialysis: Secondary | ICD-10-CM | POA: Diagnosis not present

## 2020-04-06 DIAGNOSIS — D689 Coagulation defect, unspecified: Secondary | ICD-10-CM | POA: Diagnosis not present

## 2020-04-06 DIAGNOSIS — N2581 Secondary hyperparathyroidism of renal origin: Secondary | ICD-10-CM | POA: Diagnosis not present

## 2020-04-09 DIAGNOSIS — N2581 Secondary hyperparathyroidism of renal origin: Secondary | ICD-10-CM | POA: Diagnosis not present

## 2020-04-09 DIAGNOSIS — D689 Coagulation defect, unspecified: Secondary | ICD-10-CM | POA: Diagnosis not present

## 2020-04-09 DIAGNOSIS — N186 End stage renal disease: Secondary | ICD-10-CM | POA: Diagnosis not present

## 2020-04-09 DIAGNOSIS — Z992 Dependence on renal dialysis: Secondary | ICD-10-CM | POA: Diagnosis not present

## 2020-04-10 DIAGNOSIS — Z992 Dependence on renal dialysis: Secondary | ICD-10-CM | POA: Diagnosis not present

## 2020-04-10 DIAGNOSIS — I129 Hypertensive chronic kidney disease with stage 1 through stage 4 chronic kidney disease, or unspecified chronic kidney disease: Secondary | ICD-10-CM | POA: Diagnosis not present

## 2020-04-10 DIAGNOSIS — E1122 Type 2 diabetes mellitus with diabetic chronic kidney disease: Secondary | ICD-10-CM | POA: Diagnosis not present

## 2020-04-10 DIAGNOSIS — N186 End stage renal disease: Secondary | ICD-10-CM | POA: Diagnosis not present

## 2020-04-10 DIAGNOSIS — H6012 Cellulitis of left external ear: Secondary | ICD-10-CM | POA: Diagnosis not present

## 2020-04-11 DIAGNOSIS — N186 End stage renal disease: Secondary | ICD-10-CM | POA: Diagnosis not present

## 2020-04-11 DIAGNOSIS — N2581 Secondary hyperparathyroidism of renal origin: Secondary | ICD-10-CM | POA: Diagnosis not present

## 2020-04-11 DIAGNOSIS — Z992 Dependence on renal dialysis: Secondary | ICD-10-CM | POA: Diagnosis not present

## 2020-04-11 DIAGNOSIS — D689 Coagulation defect, unspecified: Secondary | ICD-10-CM | POA: Diagnosis not present

## 2020-04-13 DIAGNOSIS — N186 End stage renal disease: Secondary | ICD-10-CM | POA: Diagnosis not present

## 2020-04-13 DIAGNOSIS — N2581 Secondary hyperparathyroidism of renal origin: Secondary | ICD-10-CM | POA: Diagnosis not present

## 2020-04-13 DIAGNOSIS — Z992 Dependence on renal dialysis: Secondary | ICD-10-CM | POA: Diagnosis not present

## 2020-04-13 DIAGNOSIS — D689 Coagulation defect, unspecified: Secondary | ICD-10-CM | POA: Diagnosis not present

## 2020-04-16 DIAGNOSIS — D689 Coagulation defect, unspecified: Secondary | ICD-10-CM | POA: Diagnosis not present

## 2020-04-16 DIAGNOSIS — N2581 Secondary hyperparathyroidism of renal origin: Secondary | ICD-10-CM | POA: Diagnosis not present

## 2020-04-16 DIAGNOSIS — Z992 Dependence on renal dialysis: Secondary | ICD-10-CM | POA: Diagnosis not present

## 2020-04-16 DIAGNOSIS — N186 End stage renal disease: Secondary | ICD-10-CM | POA: Diagnosis not present

## 2020-04-18 DIAGNOSIS — N186 End stage renal disease: Secondary | ICD-10-CM | POA: Diagnosis not present

## 2020-04-18 DIAGNOSIS — Z992 Dependence on renal dialysis: Secondary | ICD-10-CM | POA: Diagnosis not present

## 2020-04-18 DIAGNOSIS — N2581 Secondary hyperparathyroidism of renal origin: Secondary | ICD-10-CM | POA: Diagnosis not present

## 2020-04-18 DIAGNOSIS — D689 Coagulation defect, unspecified: Secondary | ICD-10-CM | POA: Diagnosis not present

## 2020-04-19 DIAGNOSIS — I129 Hypertensive chronic kidney disease with stage 1 through stage 4 chronic kidney disease, or unspecified chronic kidney disease: Secondary | ICD-10-CM | POA: Diagnosis not present

## 2020-04-19 DIAGNOSIS — Z992 Dependence on renal dialysis: Secondary | ICD-10-CM | POA: Diagnosis not present

## 2020-04-19 DIAGNOSIS — N186 End stage renal disease: Secondary | ICD-10-CM | POA: Diagnosis not present

## 2020-04-23 DIAGNOSIS — N2581 Secondary hyperparathyroidism of renal origin: Secondary | ICD-10-CM | POA: Diagnosis not present

## 2020-04-23 DIAGNOSIS — D689 Coagulation defect, unspecified: Secondary | ICD-10-CM | POA: Diagnosis not present

## 2020-04-23 DIAGNOSIS — N186 End stage renal disease: Secondary | ICD-10-CM | POA: Diagnosis not present

## 2020-04-23 DIAGNOSIS — Z992 Dependence on renal dialysis: Secondary | ICD-10-CM | POA: Diagnosis not present

## 2020-04-24 DIAGNOSIS — H6012 Cellulitis of left external ear: Secondary | ICD-10-CM | POA: Diagnosis not present

## 2020-04-25 DIAGNOSIS — Z992 Dependence on renal dialysis: Secondary | ICD-10-CM | POA: Diagnosis not present

## 2020-04-25 DIAGNOSIS — N186 End stage renal disease: Secondary | ICD-10-CM | POA: Diagnosis not present

## 2020-04-25 DIAGNOSIS — D689 Coagulation defect, unspecified: Secondary | ICD-10-CM | POA: Diagnosis not present

## 2020-04-25 DIAGNOSIS — N2581 Secondary hyperparathyroidism of renal origin: Secondary | ICD-10-CM | POA: Diagnosis not present

## 2020-04-27 DIAGNOSIS — D689 Coagulation defect, unspecified: Secondary | ICD-10-CM | POA: Diagnosis not present

## 2020-04-27 DIAGNOSIS — N2581 Secondary hyperparathyroidism of renal origin: Secondary | ICD-10-CM | POA: Diagnosis not present

## 2020-04-27 DIAGNOSIS — Z992 Dependence on renal dialysis: Secondary | ICD-10-CM | POA: Diagnosis not present

## 2020-04-27 DIAGNOSIS — N186 End stage renal disease: Secondary | ICD-10-CM | POA: Diagnosis not present

## 2020-04-30 DIAGNOSIS — N186 End stage renal disease: Secondary | ICD-10-CM | POA: Diagnosis not present

## 2020-04-30 DIAGNOSIS — Z992 Dependence on renal dialysis: Secondary | ICD-10-CM | POA: Diagnosis not present

## 2020-04-30 DIAGNOSIS — E1129 Type 2 diabetes mellitus with other diabetic kidney complication: Secondary | ICD-10-CM | POA: Diagnosis not present

## 2020-04-30 DIAGNOSIS — N2581 Secondary hyperparathyroidism of renal origin: Secondary | ICD-10-CM | POA: Diagnosis not present

## 2020-04-30 DIAGNOSIS — D689 Coagulation defect, unspecified: Secondary | ICD-10-CM | POA: Diagnosis not present

## 2020-05-02 DIAGNOSIS — Z992 Dependence on renal dialysis: Secondary | ICD-10-CM | POA: Diagnosis not present

## 2020-05-02 DIAGNOSIS — D689 Coagulation defect, unspecified: Secondary | ICD-10-CM | POA: Diagnosis not present

## 2020-05-02 DIAGNOSIS — N2581 Secondary hyperparathyroidism of renal origin: Secondary | ICD-10-CM | POA: Diagnosis not present

## 2020-05-02 DIAGNOSIS — E1129 Type 2 diabetes mellitus with other diabetic kidney complication: Secondary | ICD-10-CM | POA: Diagnosis not present

## 2020-05-02 DIAGNOSIS — N186 End stage renal disease: Secondary | ICD-10-CM | POA: Diagnosis not present

## 2020-05-04 DIAGNOSIS — N2581 Secondary hyperparathyroidism of renal origin: Secondary | ICD-10-CM | POA: Diagnosis not present

## 2020-05-04 DIAGNOSIS — Z992 Dependence on renal dialysis: Secondary | ICD-10-CM | POA: Diagnosis not present

## 2020-05-04 DIAGNOSIS — D689 Coagulation defect, unspecified: Secondary | ICD-10-CM | POA: Diagnosis not present

## 2020-05-04 DIAGNOSIS — E1129 Type 2 diabetes mellitus with other diabetic kidney complication: Secondary | ICD-10-CM | POA: Diagnosis not present

## 2020-05-04 DIAGNOSIS — N186 End stage renal disease: Secondary | ICD-10-CM | POA: Diagnosis not present

## 2020-05-09 DIAGNOSIS — N186 End stage renal disease: Secondary | ICD-10-CM | POA: Diagnosis not present

## 2020-05-09 DIAGNOSIS — D689 Coagulation defect, unspecified: Secondary | ICD-10-CM | POA: Diagnosis not present

## 2020-05-09 DIAGNOSIS — N2581 Secondary hyperparathyroidism of renal origin: Secondary | ICD-10-CM | POA: Diagnosis not present

## 2020-05-09 DIAGNOSIS — Z992 Dependence on renal dialysis: Secondary | ICD-10-CM | POA: Diagnosis not present

## 2020-05-11 DIAGNOSIS — D689 Coagulation defect, unspecified: Secondary | ICD-10-CM | POA: Diagnosis not present

## 2020-05-11 DIAGNOSIS — N186 End stage renal disease: Secondary | ICD-10-CM | POA: Diagnosis not present

## 2020-05-11 DIAGNOSIS — Z992 Dependence on renal dialysis: Secondary | ICD-10-CM | POA: Diagnosis not present

## 2020-05-11 DIAGNOSIS — N2581 Secondary hyperparathyroidism of renal origin: Secondary | ICD-10-CM | POA: Diagnosis not present

## 2020-05-14 DIAGNOSIS — D689 Coagulation defect, unspecified: Secondary | ICD-10-CM | POA: Diagnosis not present

## 2020-05-14 DIAGNOSIS — N186 End stage renal disease: Secondary | ICD-10-CM | POA: Diagnosis not present

## 2020-05-14 DIAGNOSIS — N2581 Secondary hyperparathyroidism of renal origin: Secondary | ICD-10-CM | POA: Diagnosis not present

## 2020-05-14 DIAGNOSIS — Z992 Dependence on renal dialysis: Secondary | ICD-10-CM | POA: Diagnosis not present

## 2020-05-16 DIAGNOSIS — D689 Coagulation defect, unspecified: Secondary | ICD-10-CM | POA: Diagnosis not present

## 2020-05-16 DIAGNOSIS — N2581 Secondary hyperparathyroidism of renal origin: Secondary | ICD-10-CM | POA: Diagnosis not present

## 2020-05-16 DIAGNOSIS — Z992 Dependence on renal dialysis: Secondary | ICD-10-CM | POA: Diagnosis not present

## 2020-05-16 DIAGNOSIS — N186 End stage renal disease: Secondary | ICD-10-CM | POA: Diagnosis not present

## 2020-05-18 DIAGNOSIS — N2581 Secondary hyperparathyroidism of renal origin: Secondary | ICD-10-CM | POA: Diagnosis not present

## 2020-05-18 DIAGNOSIS — D689 Coagulation defect, unspecified: Secondary | ICD-10-CM | POA: Diagnosis not present

## 2020-05-18 DIAGNOSIS — N186 End stage renal disease: Secondary | ICD-10-CM | POA: Diagnosis not present

## 2020-05-18 DIAGNOSIS — Z992 Dependence on renal dialysis: Secondary | ICD-10-CM | POA: Diagnosis not present

## 2020-05-19 DIAGNOSIS — N186 End stage renal disease: Secondary | ICD-10-CM | POA: Diagnosis not present

## 2020-05-19 DIAGNOSIS — I129 Hypertensive chronic kidney disease with stage 1 through stage 4 chronic kidney disease, or unspecified chronic kidney disease: Secondary | ICD-10-CM | POA: Diagnosis not present

## 2020-05-19 DIAGNOSIS — Z992 Dependence on renal dialysis: Secondary | ICD-10-CM | POA: Diagnosis not present

## 2020-05-21 DIAGNOSIS — N186 End stage renal disease: Secondary | ICD-10-CM | POA: Diagnosis not present

## 2020-05-21 DIAGNOSIS — Z992 Dependence on renal dialysis: Secondary | ICD-10-CM | POA: Diagnosis not present

## 2020-05-21 DIAGNOSIS — D689 Coagulation defect, unspecified: Secondary | ICD-10-CM | POA: Diagnosis not present

## 2020-05-21 DIAGNOSIS — N2581 Secondary hyperparathyroidism of renal origin: Secondary | ICD-10-CM | POA: Diagnosis not present

## 2020-05-23 DIAGNOSIS — N186 End stage renal disease: Secondary | ICD-10-CM | POA: Diagnosis not present

## 2020-05-23 DIAGNOSIS — N2581 Secondary hyperparathyroidism of renal origin: Secondary | ICD-10-CM | POA: Diagnosis not present

## 2020-05-23 DIAGNOSIS — D689 Coagulation defect, unspecified: Secondary | ICD-10-CM | POA: Diagnosis not present

## 2020-05-23 DIAGNOSIS — Z992 Dependence on renal dialysis: Secondary | ICD-10-CM | POA: Diagnosis not present

## 2020-05-28 DIAGNOSIS — N186 End stage renal disease: Secondary | ICD-10-CM | POA: Diagnosis not present

## 2020-05-28 DIAGNOSIS — D689 Coagulation defect, unspecified: Secondary | ICD-10-CM | POA: Diagnosis not present

## 2020-05-28 DIAGNOSIS — Z992 Dependence on renal dialysis: Secondary | ICD-10-CM | POA: Diagnosis not present

## 2020-05-28 DIAGNOSIS — N2581 Secondary hyperparathyroidism of renal origin: Secondary | ICD-10-CM | POA: Diagnosis not present

## 2020-05-28 DIAGNOSIS — E1129 Type 2 diabetes mellitus with other diabetic kidney complication: Secondary | ICD-10-CM | POA: Diagnosis not present

## 2020-05-30 DIAGNOSIS — N186 End stage renal disease: Secondary | ICD-10-CM | POA: Diagnosis not present

## 2020-05-30 DIAGNOSIS — E1129 Type 2 diabetes mellitus with other diabetic kidney complication: Secondary | ICD-10-CM | POA: Diagnosis not present

## 2020-05-30 DIAGNOSIS — N2581 Secondary hyperparathyroidism of renal origin: Secondary | ICD-10-CM | POA: Diagnosis not present

## 2020-05-30 DIAGNOSIS — Z992 Dependence on renal dialysis: Secondary | ICD-10-CM | POA: Diagnosis not present

## 2020-05-30 DIAGNOSIS — D689 Coagulation defect, unspecified: Secondary | ICD-10-CM | POA: Diagnosis not present

## 2020-06-01 DIAGNOSIS — N186 End stage renal disease: Secondary | ICD-10-CM | POA: Diagnosis not present

## 2020-06-01 DIAGNOSIS — N2581 Secondary hyperparathyroidism of renal origin: Secondary | ICD-10-CM | POA: Diagnosis not present

## 2020-06-01 DIAGNOSIS — E1129 Type 2 diabetes mellitus with other diabetic kidney complication: Secondary | ICD-10-CM | POA: Diagnosis not present

## 2020-06-01 DIAGNOSIS — D689 Coagulation defect, unspecified: Secondary | ICD-10-CM | POA: Diagnosis not present

## 2020-06-01 DIAGNOSIS — Z992 Dependence on renal dialysis: Secondary | ICD-10-CM | POA: Diagnosis not present

## 2020-06-04 DIAGNOSIS — Z992 Dependence on renal dialysis: Secondary | ICD-10-CM | POA: Diagnosis not present

## 2020-06-04 DIAGNOSIS — D689 Coagulation defect, unspecified: Secondary | ICD-10-CM | POA: Diagnosis not present

## 2020-06-04 DIAGNOSIS — N2581 Secondary hyperparathyroidism of renal origin: Secondary | ICD-10-CM | POA: Diagnosis not present

## 2020-06-04 DIAGNOSIS — N186 End stage renal disease: Secondary | ICD-10-CM | POA: Diagnosis not present

## 2020-06-06 DIAGNOSIS — D689 Coagulation defect, unspecified: Secondary | ICD-10-CM | POA: Diagnosis not present

## 2020-06-06 DIAGNOSIS — Z992 Dependence on renal dialysis: Secondary | ICD-10-CM | POA: Diagnosis not present

## 2020-06-06 DIAGNOSIS — N2581 Secondary hyperparathyroidism of renal origin: Secondary | ICD-10-CM | POA: Diagnosis not present

## 2020-06-06 DIAGNOSIS — N186 End stage renal disease: Secondary | ICD-10-CM | POA: Diagnosis not present

## 2020-06-08 DIAGNOSIS — Z992 Dependence on renal dialysis: Secondary | ICD-10-CM | POA: Diagnosis not present

## 2020-06-08 DIAGNOSIS — N2581 Secondary hyperparathyroidism of renal origin: Secondary | ICD-10-CM | POA: Diagnosis not present

## 2020-06-08 DIAGNOSIS — N186 End stage renal disease: Secondary | ICD-10-CM | POA: Diagnosis not present

## 2020-06-08 DIAGNOSIS — D689 Coagulation defect, unspecified: Secondary | ICD-10-CM | POA: Diagnosis not present

## 2020-06-11 DIAGNOSIS — N2581 Secondary hyperparathyroidism of renal origin: Secondary | ICD-10-CM | POA: Diagnosis not present

## 2020-06-11 DIAGNOSIS — D689 Coagulation defect, unspecified: Secondary | ICD-10-CM | POA: Diagnosis not present

## 2020-06-11 DIAGNOSIS — N186 End stage renal disease: Secondary | ICD-10-CM | POA: Diagnosis not present

## 2020-06-11 DIAGNOSIS — Z992 Dependence on renal dialysis: Secondary | ICD-10-CM | POA: Diagnosis not present

## 2020-06-12 DIAGNOSIS — Z1231 Encounter for screening mammogram for malignant neoplasm of breast: Secondary | ICD-10-CM | POA: Diagnosis not present

## 2020-06-13 DIAGNOSIS — D689 Coagulation defect, unspecified: Secondary | ICD-10-CM | POA: Diagnosis not present

## 2020-06-13 DIAGNOSIS — Z992 Dependence on renal dialysis: Secondary | ICD-10-CM | POA: Diagnosis not present

## 2020-06-13 DIAGNOSIS — N186 End stage renal disease: Secondary | ICD-10-CM | POA: Diagnosis not present

## 2020-06-13 DIAGNOSIS — N2581 Secondary hyperparathyroidism of renal origin: Secondary | ICD-10-CM | POA: Diagnosis not present

## 2020-06-15 DIAGNOSIS — Z992 Dependence on renal dialysis: Secondary | ICD-10-CM | POA: Diagnosis not present

## 2020-06-15 DIAGNOSIS — N186 End stage renal disease: Secondary | ICD-10-CM | POA: Diagnosis not present

## 2020-06-15 DIAGNOSIS — N2581 Secondary hyperparathyroidism of renal origin: Secondary | ICD-10-CM | POA: Diagnosis not present

## 2020-06-15 DIAGNOSIS — D689 Coagulation defect, unspecified: Secondary | ICD-10-CM | POA: Diagnosis not present

## 2020-06-19 DIAGNOSIS — Z992 Dependence on renal dialysis: Secondary | ICD-10-CM | POA: Diagnosis not present

## 2020-06-19 DIAGNOSIS — N186 End stage renal disease: Secondary | ICD-10-CM | POA: Diagnosis not present

## 2020-06-19 DIAGNOSIS — I129 Hypertensive chronic kidney disease with stage 1 through stage 4 chronic kidney disease, or unspecified chronic kidney disease: Secondary | ICD-10-CM | POA: Diagnosis not present

## 2020-06-20 DIAGNOSIS — D689 Coagulation defect, unspecified: Secondary | ICD-10-CM | POA: Diagnosis not present

## 2020-06-20 DIAGNOSIS — Z992 Dependence on renal dialysis: Secondary | ICD-10-CM | POA: Diagnosis not present

## 2020-06-20 DIAGNOSIS — N2581 Secondary hyperparathyroidism of renal origin: Secondary | ICD-10-CM | POA: Diagnosis not present

## 2020-06-20 DIAGNOSIS — N186 End stage renal disease: Secondary | ICD-10-CM | POA: Diagnosis not present

## 2020-06-22 DIAGNOSIS — Z992 Dependence on renal dialysis: Secondary | ICD-10-CM | POA: Diagnosis not present

## 2020-06-22 DIAGNOSIS — D689 Coagulation defect, unspecified: Secondary | ICD-10-CM | POA: Diagnosis not present

## 2020-06-22 DIAGNOSIS — N2581 Secondary hyperparathyroidism of renal origin: Secondary | ICD-10-CM | POA: Diagnosis not present

## 2020-06-22 DIAGNOSIS — N186 End stage renal disease: Secondary | ICD-10-CM | POA: Diagnosis not present

## 2020-06-25 DIAGNOSIS — N186 End stage renal disease: Secondary | ICD-10-CM | POA: Diagnosis not present

## 2020-06-25 DIAGNOSIS — N2581 Secondary hyperparathyroidism of renal origin: Secondary | ICD-10-CM | POA: Diagnosis not present

## 2020-06-25 DIAGNOSIS — Z992 Dependence on renal dialysis: Secondary | ICD-10-CM | POA: Diagnosis not present

## 2020-06-25 DIAGNOSIS — D689 Coagulation defect, unspecified: Secondary | ICD-10-CM | POA: Diagnosis not present

## 2020-06-25 DIAGNOSIS — D631 Anemia in chronic kidney disease: Secondary | ICD-10-CM | POA: Diagnosis not present

## 2020-06-25 DIAGNOSIS — E1129 Type 2 diabetes mellitus with other diabetic kidney complication: Secondary | ICD-10-CM | POA: Diagnosis not present

## 2020-06-27 DIAGNOSIS — D689 Coagulation defect, unspecified: Secondary | ICD-10-CM | POA: Diagnosis not present

## 2020-06-27 DIAGNOSIS — D631 Anemia in chronic kidney disease: Secondary | ICD-10-CM | POA: Diagnosis not present

## 2020-06-27 DIAGNOSIS — N2581 Secondary hyperparathyroidism of renal origin: Secondary | ICD-10-CM | POA: Diagnosis not present

## 2020-06-27 DIAGNOSIS — Z992 Dependence on renal dialysis: Secondary | ICD-10-CM | POA: Diagnosis not present

## 2020-06-27 DIAGNOSIS — E1129 Type 2 diabetes mellitus with other diabetic kidney complication: Secondary | ICD-10-CM | POA: Diagnosis not present

## 2020-06-27 DIAGNOSIS — N186 End stage renal disease: Secondary | ICD-10-CM | POA: Diagnosis not present

## 2020-06-29 DIAGNOSIS — D689 Coagulation defect, unspecified: Secondary | ICD-10-CM | POA: Diagnosis not present

## 2020-06-29 DIAGNOSIS — N2581 Secondary hyperparathyroidism of renal origin: Secondary | ICD-10-CM | POA: Diagnosis not present

## 2020-06-29 DIAGNOSIS — E1129 Type 2 diabetes mellitus with other diabetic kidney complication: Secondary | ICD-10-CM | POA: Diagnosis not present

## 2020-06-29 DIAGNOSIS — Z992 Dependence on renal dialysis: Secondary | ICD-10-CM | POA: Diagnosis not present

## 2020-06-29 DIAGNOSIS — D631 Anemia in chronic kidney disease: Secondary | ICD-10-CM | POA: Diagnosis not present

## 2020-06-29 DIAGNOSIS — N186 End stage renal disease: Secondary | ICD-10-CM | POA: Diagnosis not present

## 2020-07-02 DIAGNOSIS — N2581 Secondary hyperparathyroidism of renal origin: Secondary | ICD-10-CM | POA: Diagnosis not present

## 2020-07-02 DIAGNOSIS — N186 End stage renal disease: Secondary | ICD-10-CM | POA: Diagnosis not present

## 2020-07-02 DIAGNOSIS — D509 Iron deficiency anemia, unspecified: Secondary | ICD-10-CM | POA: Diagnosis not present

## 2020-07-02 DIAGNOSIS — D689 Coagulation defect, unspecified: Secondary | ICD-10-CM | POA: Diagnosis not present

## 2020-07-02 DIAGNOSIS — Z992 Dependence on renal dialysis: Secondary | ICD-10-CM | POA: Diagnosis not present

## 2020-07-04 DIAGNOSIS — N186 End stage renal disease: Secondary | ICD-10-CM | POA: Diagnosis not present

## 2020-07-04 DIAGNOSIS — D689 Coagulation defect, unspecified: Secondary | ICD-10-CM | POA: Diagnosis not present

## 2020-07-04 DIAGNOSIS — N2581 Secondary hyperparathyroidism of renal origin: Secondary | ICD-10-CM | POA: Diagnosis not present

## 2020-07-04 DIAGNOSIS — D509 Iron deficiency anemia, unspecified: Secondary | ICD-10-CM | POA: Diagnosis not present

## 2020-07-04 DIAGNOSIS — Z992 Dependence on renal dialysis: Secondary | ICD-10-CM | POA: Diagnosis not present

## 2020-07-06 DIAGNOSIS — D689 Coagulation defect, unspecified: Secondary | ICD-10-CM | POA: Diagnosis not present

## 2020-07-06 DIAGNOSIS — N2581 Secondary hyperparathyroidism of renal origin: Secondary | ICD-10-CM | POA: Diagnosis not present

## 2020-07-06 DIAGNOSIS — N186 End stage renal disease: Secondary | ICD-10-CM | POA: Diagnosis not present

## 2020-07-06 DIAGNOSIS — Z992 Dependence on renal dialysis: Secondary | ICD-10-CM | POA: Diagnosis not present

## 2020-07-06 DIAGNOSIS — D509 Iron deficiency anemia, unspecified: Secondary | ICD-10-CM | POA: Diagnosis not present

## 2020-07-09 DIAGNOSIS — N2581 Secondary hyperparathyroidism of renal origin: Secondary | ICD-10-CM | POA: Diagnosis not present

## 2020-07-09 DIAGNOSIS — Z992 Dependence on renal dialysis: Secondary | ICD-10-CM | POA: Diagnosis not present

## 2020-07-09 DIAGNOSIS — D689 Coagulation defect, unspecified: Secondary | ICD-10-CM | POA: Diagnosis not present

## 2020-07-09 DIAGNOSIS — N186 End stage renal disease: Secondary | ICD-10-CM | POA: Diagnosis not present

## 2020-07-13 DIAGNOSIS — N186 End stage renal disease: Secondary | ICD-10-CM | POA: Diagnosis not present

## 2020-07-13 DIAGNOSIS — Z992 Dependence on renal dialysis: Secondary | ICD-10-CM | POA: Diagnosis not present

## 2020-07-13 DIAGNOSIS — D689 Coagulation defect, unspecified: Secondary | ICD-10-CM | POA: Diagnosis not present

## 2020-07-13 DIAGNOSIS — N2581 Secondary hyperparathyroidism of renal origin: Secondary | ICD-10-CM | POA: Diagnosis not present

## 2020-07-16 DIAGNOSIS — D509 Iron deficiency anemia, unspecified: Secondary | ICD-10-CM | POA: Diagnosis not present

## 2020-07-16 DIAGNOSIS — D689 Coagulation defect, unspecified: Secondary | ICD-10-CM | POA: Diagnosis not present

## 2020-07-16 DIAGNOSIS — N186 End stage renal disease: Secondary | ICD-10-CM | POA: Diagnosis not present

## 2020-07-16 DIAGNOSIS — N2581 Secondary hyperparathyroidism of renal origin: Secondary | ICD-10-CM | POA: Diagnosis not present

## 2020-07-16 DIAGNOSIS — Z992 Dependence on renal dialysis: Secondary | ICD-10-CM | POA: Diagnosis not present

## 2020-07-16 DIAGNOSIS — D631 Anemia in chronic kidney disease: Secondary | ICD-10-CM | POA: Diagnosis not present

## 2020-07-18 DIAGNOSIS — N186 End stage renal disease: Secondary | ICD-10-CM | POA: Diagnosis not present

## 2020-07-18 DIAGNOSIS — Z992 Dependence on renal dialysis: Secondary | ICD-10-CM | POA: Diagnosis not present

## 2020-07-18 DIAGNOSIS — N2581 Secondary hyperparathyroidism of renal origin: Secondary | ICD-10-CM | POA: Diagnosis not present

## 2020-07-18 DIAGNOSIS — D689 Coagulation defect, unspecified: Secondary | ICD-10-CM | POA: Diagnosis not present

## 2020-07-18 DIAGNOSIS — D509 Iron deficiency anemia, unspecified: Secondary | ICD-10-CM | POA: Diagnosis not present

## 2020-07-18 DIAGNOSIS — D631 Anemia in chronic kidney disease: Secondary | ICD-10-CM | POA: Diagnosis not present

## 2020-07-19 DIAGNOSIS — N186 End stage renal disease: Secondary | ICD-10-CM | POA: Diagnosis not present

## 2020-07-19 DIAGNOSIS — I129 Hypertensive chronic kidney disease with stage 1 through stage 4 chronic kidney disease, or unspecified chronic kidney disease: Secondary | ICD-10-CM | POA: Diagnosis not present

## 2020-07-19 DIAGNOSIS — Z992 Dependence on renal dialysis: Secondary | ICD-10-CM | POA: Diagnosis not present

## 2020-07-20 DIAGNOSIS — D689 Coagulation defect, unspecified: Secondary | ICD-10-CM | POA: Diagnosis not present

## 2020-07-20 DIAGNOSIS — N2581 Secondary hyperparathyroidism of renal origin: Secondary | ICD-10-CM | POA: Diagnosis not present

## 2020-07-20 DIAGNOSIS — D631 Anemia in chronic kidney disease: Secondary | ICD-10-CM | POA: Diagnosis not present

## 2020-07-20 DIAGNOSIS — Z992 Dependence on renal dialysis: Secondary | ICD-10-CM | POA: Diagnosis not present

## 2020-07-20 DIAGNOSIS — D509 Iron deficiency anemia, unspecified: Secondary | ICD-10-CM | POA: Diagnosis not present

## 2020-07-20 DIAGNOSIS — N186 End stage renal disease: Secondary | ICD-10-CM | POA: Diagnosis not present

## 2020-07-20 DIAGNOSIS — E1129 Type 2 diabetes mellitus with other diabetic kidney complication: Secondary | ICD-10-CM | POA: Diagnosis not present

## 2020-07-23 DIAGNOSIS — Z992 Dependence on renal dialysis: Secondary | ICD-10-CM | POA: Diagnosis not present

## 2020-07-23 DIAGNOSIS — D631 Anemia in chronic kidney disease: Secondary | ICD-10-CM | POA: Diagnosis not present

## 2020-07-23 DIAGNOSIS — D509 Iron deficiency anemia, unspecified: Secondary | ICD-10-CM | POA: Diagnosis not present

## 2020-07-23 DIAGNOSIS — D689 Coagulation defect, unspecified: Secondary | ICD-10-CM | POA: Diagnosis not present

## 2020-07-23 DIAGNOSIS — N186 End stage renal disease: Secondary | ICD-10-CM | POA: Diagnosis not present

## 2020-07-23 DIAGNOSIS — N2581 Secondary hyperparathyroidism of renal origin: Secondary | ICD-10-CM | POA: Diagnosis not present

## 2020-07-23 DIAGNOSIS — E1129 Type 2 diabetes mellitus with other diabetic kidney complication: Secondary | ICD-10-CM | POA: Diagnosis not present

## 2020-07-24 DIAGNOSIS — I120 Hypertensive chronic kidney disease with stage 5 chronic kidney disease or end stage renal disease: Secondary | ICD-10-CM | POA: Diagnosis not present

## 2020-07-24 DIAGNOSIS — E1169 Type 2 diabetes mellitus with other specified complication: Secondary | ICD-10-CM | POA: Diagnosis not present

## 2020-07-24 DIAGNOSIS — Y841 Kidney dialysis as the cause of abnormal reaction of the patient, or of later complication, without mention of misadventure at the time of the procedure: Secondary | ICD-10-CM | POA: Diagnosis not present

## 2020-07-24 DIAGNOSIS — Z0001 Encounter for general adult medical examination with abnormal findings: Secondary | ICD-10-CM | POA: Diagnosis not present

## 2020-07-25 DIAGNOSIS — D631 Anemia in chronic kidney disease: Secondary | ICD-10-CM | POA: Diagnosis not present

## 2020-07-25 DIAGNOSIS — D689 Coagulation defect, unspecified: Secondary | ICD-10-CM | POA: Diagnosis not present

## 2020-07-25 DIAGNOSIS — E1129 Type 2 diabetes mellitus with other diabetic kidney complication: Secondary | ICD-10-CM | POA: Diagnosis not present

## 2020-07-25 DIAGNOSIS — D509 Iron deficiency anemia, unspecified: Secondary | ICD-10-CM | POA: Diagnosis not present

## 2020-07-25 DIAGNOSIS — N186 End stage renal disease: Secondary | ICD-10-CM | POA: Diagnosis not present

## 2020-07-25 DIAGNOSIS — N2581 Secondary hyperparathyroidism of renal origin: Secondary | ICD-10-CM | POA: Diagnosis not present

## 2020-07-25 DIAGNOSIS — Z992 Dependence on renal dialysis: Secondary | ICD-10-CM | POA: Diagnosis not present

## 2020-07-30 DIAGNOSIS — D631 Anemia in chronic kidney disease: Secondary | ICD-10-CM | POA: Diagnosis not present

## 2020-07-30 DIAGNOSIS — N186 End stage renal disease: Secondary | ICD-10-CM | POA: Diagnosis not present

## 2020-07-30 DIAGNOSIS — E1129 Type 2 diabetes mellitus with other diabetic kidney complication: Secondary | ICD-10-CM | POA: Diagnosis not present

## 2020-07-30 DIAGNOSIS — D689 Coagulation defect, unspecified: Secondary | ICD-10-CM | POA: Diagnosis not present

## 2020-07-30 DIAGNOSIS — N2581 Secondary hyperparathyroidism of renal origin: Secondary | ICD-10-CM | POA: Diagnosis not present

## 2020-07-30 DIAGNOSIS — D509 Iron deficiency anemia, unspecified: Secondary | ICD-10-CM | POA: Diagnosis not present

## 2020-07-30 DIAGNOSIS — Z992 Dependence on renal dialysis: Secondary | ICD-10-CM | POA: Diagnosis not present

## 2020-08-01 DIAGNOSIS — Z992 Dependence on renal dialysis: Secondary | ICD-10-CM | POA: Diagnosis not present

## 2020-08-01 DIAGNOSIS — N2581 Secondary hyperparathyroidism of renal origin: Secondary | ICD-10-CM | POA: Diagnosis not present

## 2020-08-01 DIAGNOSIS — D509 Iron deficiency anemia, unspecified: Secondary | ICD-10-CM | POA: Diagnosis not present

## 2020-08-01 DIAGNOSIS — N186 End stage renal disease: Secondary | ICD-10-CM | POA: Diagnosis not present

## 2020-08-01 DIAGNOSIS — D631 Anemia in chronic kidney disease: Secondary | ICD-10-CM | POA: Diagnosis not present

## 2020-08-01 DIAGNOSIS — D689 Coagulation defect, unspecified: Secondary | ICD-10-CM | POA: Diagnosis not present

## 2020-08-01 DIAGNOSIS — E1129 Type 2 diabetes mellitus with other diabetic kidney complication: Secondary | ICD-10-CM | POA: Diagnosis not present

## 2020-08-03 DIAGNOSIS — D631 Anemia in chronic kidney disease: Secondary | ICD-10-CM | POA: Diagnosis not present

## 2020-08-03 DIAGNOSIS — N186 End stage renal disease: Secondary | ICD-10-CM | POA: Diagnosis not present

## 2020-08-03 DIAGNOSIS — D509 Iron deficiency anemia, unspecified: Secondary | ICD-10-CM | POA: Diagnosis not present

## 2020-08-03 DIAGNOSIS — E1129 Type 2 diabetes mellitus with other diabetic kidney complication: Secondary | ICD-10-CM | POA: Diagnosis not present

## 2020-08-03 DIAGNOSIS — D689 Coagulation defect, unspecified: Secondary | ICD-10-CM | POA: Diagnosis not present

## 2020-08-03 DIAGNOSIS — N2581 Secondary hyperparathyroidism of renal origin: Secondary | ICD-10-CM | POA: Diagnosis not present

## 2020-08-03 DIAGNOSIS — Z992 Dependence on renal dialysis: Secondary | ICD-10-CM | POA: Diagnosis not present

## 2020-08-06 DIAGNOSIS — N2581 Secondary hyperparathyroidism of renal origin: Secondary | ICD-10-CM | POA: Diagnosis not present

## 2020-08-06 DIAGNOSIS — Z992 Dependence on renal dialysis: Secondary | ICD-10-CM | POA: Diagnosis not present

## 2020-08-06 DIAGNOSIS — E1129 Type 2 diabetes mellitus with other diabetic kidney complication: Secondary | ICD-10-CM | POA: Diagnosis not present

## 2020-08-06 DIAGNOSIS — D509 Iron deficiency anemia, unspecified: Secondary | ICD-10-CM | POA: Diagnosis not present

## 2020-08-06 DIAGNOSIS — D689 Coagulation defect, unspecified: Secondary | ICD-10-CM | POA: Diagnosis not present

## 2020-08-06 DIAGNOSIS — N186 End stage renal disease: Secondary | ICD-10-CM | POA: Diagnosis not present

## 2020-08-06 DIAGNOSIS — D631 Anemia in chronic kidney disease: Secondary | ICD-10-CM | POA: Diagnosis not present

## 2020-08-10 DIAGNOSIS — D631 Anemia in chronic kidney disease: Secondary | ICD-10-CM | POA: Diagnosis not present

## 2020-08-10 DIAGNOSIS — D689 Coagulation defect, unspecified: Secondary | ICD-10-CM | POA: Diagnosis not present

## 2020-08-10 DIAGNOSIS — N2581 Secondary hyperparathyroidism of renal origin: Secondary | ICD-10-CM | POA: Diagnosis not present

## 2020-08-10 DIAGNOSIS — D509 Iron deficiency anemia, unspecified: Secondary | ICD-10-CM | POA: Diagnosis not present

## 2020-08-10 DIAGNOSIS — Z992 Dependence on renal dialysis: Secondary | ICD-10-CM | POA: Diagnosis not present

## 2020-08-10 DIAGNOSIS — E1129 Type 2 diabetes mellitus with other diabetic kidney complication: Secondary | ICD-10-CM | POA: Diagnosis not present

## 2020-08-10 DIAGNOSIS — N186 End stage renal disease: Secondary | ICD-10-CM | POA: Diagnosis not present

## 2020-08-13 DIAGNOSIS — D689 Coagulation defect, unspecified: Secondary | ICD-10-CM | POA: Diagnosis not present

## 2020-08-13 DIAGNOSIS — E1129 Type 2 diabetes mellitus with other diabetic kidney complication: Secondary | ICD-10-CM | POA: Diagnosis not present

## 2020-08-13 DIAGNOSIS — N2581 Secondary hyperparathyroidism of renal origin: Secondary | ICD-10-CM | POA: Diagnosis not present

## 2020-08-13 DIAGNOSIS — Z992 Dependence on renal dialysis: Secondary | ICD-10-CM | POA: Diagnosis not present

## 2020-08-13 DIAGNOSIS — D631 Anemia in chronic kidney disease: Secondary | ICD-10-CM | POA: Diagnosis not present

## 2020-08-13 DIAGNOSIS — D509 Iron deficiency anemia, unspecified: Secondary | ICD-10-CM | POA: Diagnosis not present

## 2020-08-13 DIAGNOSIS — N186 End stage renal disease: Secondary | ICD-10-CM | POA: Diagnosis not present

## 2020-08-15 DIAGNOSIS — Z992 Dependence on renal dialysis: Secondary | ICD-10-CM | POA: Diagnosis not present

## 2020-08-15 DIAGNOSIS — E1129 Type 2 diabetes mellitus with other diabetic kidney complication: Secondary | ICD-10-CM | POA: Diagnosis not present

## 2020-08-15 DIAGNOSIS — D509 Iron deficiency anemia, unspecified: Secondary | ICD-10-CM | POA: Diagnosis not present

## 2020-08-15 DIAGNOSIS — N2581 Secondary hyperparathyroidism of renal origin: Secondary | ICD-10-CM | POA: Diagnosis not present

## 2020-08-15 DIAGNOSIS — D689 Coagulation defect, unspecified: Secondary | ICD-10-CM | POA: Diagnosis not present

## 2020-08-15 DIAGNOSIS — D631 Anemia in chronic kidney disease: Secondary | ICD-10-CM | POA: Diagnosis not present

## 2020-08-15 DIAGNOSIS — N186 End stage renal disease: Secondary | ICD-10-CM | POA: Diagnosis not present

## 2020-08-17 DIAGNOSIS — D689 Coagulation defect, unspecified: Secondary | ICD-10-CM | POA: Diagnosis not present

## 2020-08-17 DIAGNOSIS — Z992 Dependence on renal dialysis: Secondary | ICD-10-CM | POA: Diagnosis not present

## 2020-08-17 DIAGNOSIS — D509 Iron deficiency anemia, unspecified: Secondary | ICD-10-CM | POA: Diagnosis not present

## 2020-08-17 DIAGNOSIS — N2581 Secondary hyperparathyroidism of renal origin: Secondary | ICD-10-CM | POA: Diagnosis not present

## 2020-08-17 DIAGNOSIS — E1129 Type 2 diabetes mellitus with other diabetic kidney complication: Secondary | ICD-10-CM | POA: Diagnosis not present

## 2020-08-17 DIAGNOSIS — N186 End stage renal disease: Secondary | ICD-10-CM | POA: Diagnosis not present

## 2020-08-17 DIAGNOSIS — D631 Anemia in chronic kidney disease: Secondary | ICD-10-CM | POA: Diagnosis not present

## 2020-08-19 DIAGNOSIS — I129 Hypertensive chronic kidney disease with stage 1 through stage 4 chronic kidney disease, or unspecified chronic kidney disease: Secondary | ICD-10-CM | POA: Diagnosis not present

## 2020-08-19 DIAGNOSIS — N186 End stage renal disease: Secondary | ICD-10-CM | POA: Diagnosis not present

## 2020-08-19 DIAGNOSIS — Z992 Dependence on renal dialysis: Secondary | ICD-10-CM | POA: Diagnosis not present

## 2020-08-20 DIAGNOSIS — N2581 Secondary hyperparathyroidism of renal origin: Secondary | ICD-10-CM | POA: Diagnosis not present

## 2020-08-20 DIAGNOSIS — N186 End stage renal disease: Secondary | ICD-10-CM | POA: Diagnosis not present

## 2020-08-20 DIAGNOSIS — E1129 Type 2 diabetes mellitus with other diabetic kidney complication: Secondary | ICD-10-CM | POA: Diagnosis not present

## 2020-08-20 DIAGNOSIS — D689 Coagulation defect, unspecified: Secondary | ICD-10-CM | POA: Diagnosis not present

## 2020-08-20 DIAGNOSIS — D509 Iron deficiency anemia, unspecified: Secondary | ICD-10-CM | POA: Diagnosis not present

## 2020-08-20 DIAGNOSIS — Z992 Dependence on renal dialysis: Secondary | ICD-10-CM | POA: Diagnosis not present

## 2020-08-22 DIAGNOSIS — D509 Iron deficiency anemia, unspecified: Secondary | ICD-10-CM | POA: Diagnosis not present

## 2020-08-22 DIAGNOSIS — E1129 Type 2 diabetes mellitus with other diabetic kidney complication: Secondary | ICD-10-CM | POA: Diagnosis not present

## 2020-08-22 DIAGNOSIS — N2581 Secondary hyperparathyroidism of renal origin: Secondary | ICD-10-CM | POA: Diagnosis not present

## 2020-08-22 DIAGNOSIS — Z992 Dependence on renal dialysis: Secondary | ICD-10-CM | POA: Diagnosis not present

## 2020-08-22 DIAGNOSIS — D689 Coagulation defect, unspecified: Secondary | ICD-10-CM | POA: Diagnosis not present

## 2020-08-22 DIAGNOSIS — N186 End stage renal disease: Secondary | ICD-10-CM | POA: Diagnosis not present

## 2020-08-27 DIAGNOSIS — Z992 Dependence on renal dialysis: Secondary | ICD-10-CM | POA: Diagnosis not present

## 2020-08-27 DIAGNOSIS — N186 End stage renal disease: Secondary | ICD-10-CM | POA: Diagnosis not present

## 2020-08-27 DIAGNOSIS — E1129 Type 2 diabetes mellitus with other diabetic kidney complication: Secondary | ICD-10-CM | POA: Diagnosis not present

## 2020-08-27 DIAGNOSIS — D509 Iron deficiency anemia, unspecified: Secondary | ICD-10-CM | POA: Diagnosis not present

## 2020-08-27 DIAGNOSIS — N2581 Secondary hyperparathyroidism of renal origin: Secondary | ICD-10-CM | POA: Diagnosis not present

## 2020-08-27 DIAGNOSIS — D689 Coagulation defect, unspecified: Secondary | ICD-10-CM | POA: Diagnosis not present

## 2020-08-29 DIAGNOSIS — D509 Iron deficiency anemia, unspecified: Secondary | ICD-10-CM | POA: Diagnosis not present

## 2020-08-29 DIAGNOSIS — D689 Coagulation defect, unspecified: Secondary | ICD-10-CM | POA: Diagnosis not present

## 2020-08-29 DIAGNOSIS — Z992 Dependence on renal dialysis: Secondary | ICD-10-CM | POA: Diagnosis not present

## 2020-08-29 DIAGNOSIS — N186 End stage renal disease: Secondary | ICD-10-CM | POA: Diagnosis not present

## 2020-08-29 DIAGNOSIS — E1129 Type 2 diabetes mellitus with other diabetic kidney complication: Secondary | ICD-10-CM | POA: Diagnosis not present

## 2020-08-29 DIAGNOSIS — N2581 Secondary hyperparathyroidism of renal origin: Secondary | ICD-10-CM | POA: Diagnosis not present

## 2020-09-03 DIAGNOSIS — N186 End stage renal disease: Secondary | ICD-10-CM | POA: Diagnosis not present

## 2020-09-03 DIAGNOSIS — D509 Iron deficiency anemia, unspecified: Secondary | ICD-10-CM | POA: Diagnosis not present

## 2020-09-03 DIAGNOSIS — N2581 Secondary hyperparathyroidism of renal origin: Secondary | ICD-10-CM | POA: Diagnosis not present

## 2020-09-03 DIAGNOSIS — D689 Coagulation defect, unspecified: Secondary | ICD-10-CM | POA: Diagnosis not present

## 2020-09-03 DIAGNOSIS — U071 COVID-19: Secondary | ICD-10-CM | POA: Diagnosis not present

## 2020-09-03 DIAGNOSIS — E1129 Type 2 diabetes mellitus with other diabetic kidney complication: Secondary | ICD-10-CM | POA: Diagnosis not present

## 2020-09-03 DIAGNOSIS — Z992 Dependence on renal dialysis: Secondary | ICD-10-CM | POA: Diagnosis not present

## 2020-09-05 DIAGNOSIS — U071 COVID-19: Secondary | ICD-10-CM | POA: Diagnosis not present

## 2020-09-05 DIAGNOSIS — E1129 Type 2 diabetes mellitus with other diabetic kidney complication: Secondary | ICD-10-CM | POA: Diagnosis not present

## 2020-09-05 DIAGNOSIS — N2581 Secondary hyperparathyroidism of renal origin: Secondary | ICD-10-CM | POA: Diagnosis not present

## 2020-09-05 DIAGNOSIS — D689 Coagulation defect, unspecified: Secondary | ICD-10-CM | POA: Diagnosis not present

## 2020-09-05 DIAGNOSIS — N186 End stage renal disease: Secondary | ICD-10-CM | POA: Diagnosis not present

## 2020-09-05 DIAGNOSIS — D509 Iron deficiency anemia, unspecified: Secondary | ICD-10-CM | POA: Diagnosis not present

## 2020-09-05 DIAGNOSIS — Z992 Dependence on renal dialysis: Secondary | ICD-10-CM | POA: Diagnosis not present

## 2020-09-07 DIAGNOSIS — D689 Coagulation defect, unspecified: Secondary | ICD-10-CM | POA: Diagnosis not present

## 2020-09-07 DIAGNOSIS — N186 End stage renal disease: Secondary | ICD-10-CM | POA: Diagnosis not present

## 2020-09-07 DIAGNOSIS — N2581 Secondary hyperparathyroidism of renal origin: Secondary | ICD-10-CM | POA: Diagnosis not present

## 2020-09-07 DIAGNOSIS — D509 Iron deficiency anemia, unspecified: Secondary | ICD-10-CM | POA: Diagnosis not present

## 2020-09-07 DIAGNOSIS — E1129 Type 2 diabetes mellitus with other diabetic kidney complication: Secondary | ICD-10-CM | POA: Diagnosis not present

## 2020-09-07 DIAGNOSIS — Z992 Dependence on renal dialysis: Secondary | ICD-10-CM | POA: Diagnosis not present

## 2020-09-10 DIAGNOSIS — E1129 Type 2 diabetes mellitus with other diabetic kidney complication: Secondary | ICD-10-CM | POA: Diagnosis not present

## 2020-09-10 DIAGNOSIS — D689 Coagulation defect, unspecified: Secondary | ICD-10-CM | POA: Diagnosis not present

## 2020-09-10 DIAGNOSIS — N2581 Secondary hyperparathyroidism of renal origin: Secondary | ICD-10-CM | POA: Diagnosis not present

## 2020-09-10 DIAGNOSIS — D509 Iron deficiency anemia, unspecified: Secondary | ICD-10-CM | POA: Diagnosis not present

## 2020-09-10 DIAGNOSIS — Z992 Dependence on renal dialysis: Secondary | ICD-10-CM | POA: Diagnosis not present

## 2020-09-10 DIAGNOSIS — N186 End stage renal disease: Secondary | ICD-10-CM | POA: Diagnosis not present

## 2020-09-12 DIAGNOSIS — D509 Iron deficiency anemia, unspecified: Secondary | ICD-10-CM | POA: Diagnosis not present

## 2020-09-12 DIAGNOSIS — N2581 Secondary hyperparathyroidism of renal origin: Secondary | ICD-10-CM | POA: Diagnosis not present

## 2020-09-12 DIAGNOSIS — N186 End stage renal disease: Secondary | ICD-10-CM | POA: Diagnosis not present

## 2020-09-12 DIAGNOSIS — E1129 Type 2 diabetes mellitus with other diabetic kidney complication: Secondary | ICD-10-CM | POA: Diagnosis not present

## 2020-09-12 DIAGNOSIS — Z992 Dependence on renal dialysis: Secondary | ICD-10-CM | POA: Diagnosis not present

## 2020-09-12 DIAGNOSIS — D689 Coagulation defect, unspecified: Secondary | ICD-10-CM | POA: Diagnosis not present

## 2020-09-17 DIAGNOSIS — N186 End stage renal disease: Secondary | ICD-10-CM | POA: Diagnosis not present

## 2020-09-17 DIAGNOSIS — D509 Iron deficiency anemia, unspecified: Secondary | ICD-10-CM | POA: Diagnosis not present

## 2020-09-17 DIAGNOSIS — E1129 Type 2 diabetes mellitus with other diabetic kidney complication: Secondary | ICD-10-CM | POA: Diagnosis not present

## 2020-09-17 DIAGNOSIS — D689 Coagulation defect, unspecified: Secondary | ICD-10-CM | POA: Diagnosis not present

## 2020-09-17 DIAGNOSIS — Z992 Dependence on renal dialysis: Secondary | ICD-10-CM | POA: Diagnosis not present

## 2020-09-17 DIAGNOSIS — N2581 Secondary hyperparathyroidism of renal origin: Secondary | ICD-10-CM | POA: Diagnosis not present

## 2020-09-19 DIAGNOSIS — D509 Iron deficiency anemia, unspecified: Secondary | ICD-10-CM | POA: Diagnosis not present

## 2020-09-19 DIAGNOSIS — N186 End stage renal disease: Secondary | ICD-10-CM | POA: Diagnosis not present

## 2020-09-19 DIAGNOSIS — E1129 Type 2 diabetes mellitus with other diabetic kidney complication: Secondary | ICD-10-CM | POA: Diagnosis not present

## 2020-09-19 DIAGNOSIS — N2581 Secondary hyperparathyroidism of renal origin: Secondary | ICD-10-CM | POA: Diagnosis not present

## 2020-09-19 DIAGNOSIS — D689 Coagulation defect, unspecified: Secondary | ICD-10-CM | POA: Diagnosis not present

## 2020-09-19 DIAGNOSIS — Z992 Dependence on renal dialysis: Secondary | ICD-10-CM | POA: Diagnosis not present

## 2020-09-19 DIAGNOSIS — I129 Hypertensive chronic kidney disease with stage 1 through stage 4 chronic kidney disease, or unspecified chronic kidney disease: Secondary | ICD-10-CM | POA: Diagnosis not present

## 2020-09-21 DIAGNOSIS — D689 Coagulation defect, unspecified: Secondary | ICD-10-CM | POA: Diagnosis not present

## 2020-09-21 DIAGNOSIS — Z992 Dependence on renal dialysis: Secondary | ICD-10-CM | POA: Diagnosis not present

## 2020-09-21 DIAGNOSIS — N2581 Secondary hyperparathyroidism of renal origin: Secondary | ICD-10-CM | POA: Diagnosis not present

## 2020-09-21 DIAGNOSIS — N186 End stage renal disease: Secondary | ICD-10-CM | POA: Diagnosis not present

## 2020-09-26 DIAGNOSIS — D689 Coagulation defect, unspecified: Secondary | ICD-10-CM | POA: Diagnosis not present

## 2020-09-26 DIAGNOSIS — Z992 Dependence on renal dialysis: Secondary | ICD-10-CM | POA: Diagnosis not present

## 2020-09-26 DIAGNOSIS — N2581 Secondary hyperparathyroidism of renal origin: Secondary | ICD-10-CM | POA: Diagnosis not present

## 2020-09-26 DIAGNOSIS — N186 End stage renal disease: Secondary | ICD-10-CM | POA: Diagnosis not present

## 2020-09-28 DIAGNOSIS — Z992 Dependence on renal dialysis: Secondary | ICD-10-CM | POA: Diagnosis not present

## 2020-09-28 DIAGNOSIS — D689 Coagulation defect, unspecified: Secondary | ICD-10-CM | POA: Diagnosis not present

## 2020-09-28 DIAGNOSIS — N2581 Secondary hyperparathyroidism of renal origin: Secondary | ICD-10-CM | POA: Diagnosis not present

## 2020-09-28 DIAGNOSIS — N186 End stage renal disease: Secondary | ICD-10-CM | POA: Diagnosis not present

## 2020-10-01 DIAGNOSIS — N2581 Secondary hyperparathyroidism of renal origin: Secondary | ICD-10-CM | POA: Diagnosis not present

## 2020-10-01 DIAGNOSIS — Z992 Dependence on renal dialysis: Secondary | ICD-10-CM | POA: Diagnosis not present

## 2020-10-01 DIAGNOSIS — N186 End stage renal disease: Secondary | ICD-10-CM | POA: Diagnosis not present

## 2020-10-01 DIAGNOSIS — D689 Coagulation defect, unspecified: Secondary | ICD-10-CM | POA: Diagnosis not present

## 2020-10-03 DIAGNOSIS — N2581 Secondary hyperparathyroidism of renal origin: Secondary | ICD-10-CM | POA: Diagnosis not present

## 2020-10-03 DIAGNOSIS — N186 End stage renal disease: Secondary | ICD-10-CM | POA: Diagnosis not present

## 2020-10-03 DIAGNOSIS — D689 Coagulation defect, unspecified: Secondary | ICD-10-CM | POA: Diagnosis not present

## 2020-10-03 DIAGNOSIS — Z992 Dependence on renal dialysis: Secondary | ICD-10-CM | POA: Diagnosis not present

## 2020-10-05 DIAGNOSIS — N186 End stage renal disease: Secondary | ICD-10-CM | POA: Diagnosis not present

## 2020-10-05 DIAGNOSIS — Z992 Dependence on renal dialysis: Secondary | ICD-10-CM | POA: Diagnosis not present

## 2020-10-05 DIAGNOSIS — N2581 Secondary hyperparathyroidism of renal origin: Secondary | ICD-10-CM | POA: Diagnosis not present

## 2020-10-05 DIAGNOSIS — D689 Coagulation defect, unspecified: Secondary | ICD-10-CM | POA: Diagnosis not present

## 2020-10-10 DIAGNOSIS — N2581 Secondary hyperparathyroidism of renal origin: Secondary | ICD-10-CM | POA: Diagnosis not present

## 2020-10-10 DIAGNOSIS — D689 Coagulation defect, unspecified: Secondary | ICD-10-CM | POA: Diagnosis not present

## 2020-10-10 DIAGNOSIS — Z992 Dependence on renal dialysis: Secondary | ICD-10-CM | POA: Diagnosis not present

## 2020-10-10 DIAGNOSIS — N186 End stage renal disease: Secondary | ICD-10-CM | POA: Diagnosis not present

## 2020-10-12 DIAGNOSIS — D689 Coagulation defect, unspecified: Secondary | ICD-10-CM | POA: Diagnosis not present

## 2020-10-12 DIAGNOSIS — N2581 Secondary hyperparathyroidism of renal origin: Secondary | ICD-10-CM | POA: Diagnosis not present

## 2020-10-12 DIAGNOSIS — Z992 Dependence on renal dialysis: Secondary | ICD-10-CM | POA: Diagnosis not present

## 2020-10-12 DIAGNOSIS — N186 End stage renal disease: Secondary | ICD-10-CM | POA: Diagnosis not present

## 2020-10-15 DIAGNOSIS — D689 Coagulation defect, unspecified: Secondary | ICD-10-CM | POA: Diagnosis not present

## 2020-10-15 DIAGNOSIS — N2581 Secondary hyperparathyroidism of renal origin: Secondary | ICD-10-CM | POA: Diagnosis not present

## 2020-10-15 DIAGNOSIS — N186 End stage renal disease: Secondary | ICD-10-CM | POA: Diagnosis not present

## 2020-10-15 DIAGNOSIS — Z992 Dependence on renal dialysis: Secondary | ICD-10-CM | POA: Diagnosis not present

## 2020-10-16 DIAGNOSIS — I739 Peripheral vascular disease, unspecified: Secondary | ICD-10-CM | POA: Diagnosis not present

## 2020-10-16 DIAGNOSIS — E1169 Type 2 diabetes mellitus with other specified complication: Secondary | ICD-10-CM | POA: Diagnosis not present

## 2020-10-17 DIAGNOSIS — N2581 Secondary hyperparathyroidism of renal origin: Secondary | ICD-10-CM | POA: Diagnosis not present

## 2020-10-17 DIAGNOSIS — N186 End stage renal disease: Secondary | ICD-10-CM | POA: Diagnosis not present

## 2020-10-17 DIAGNOSIS — Z992 Dependence on renal dialysis: Secondary | ICD-10-CM | POA: Diagnosis not present

## 2020-10-17 DIAGNOSIS — D689 Coagulation defect, unspecified: Secondary | ICD-10-CM | POA: Diagnosis not present

## 2020-10-18 ENCOUNTER — Encounter: Payer: Medicare Other | Admitting: Student

## 2020-10-19 DIAGNOSIS — Z992 Dependence on renal dialysis: Secondary | ICD-10-CM | POA: Diagnosis not present

## 2020-10-19 DIAGNOSIS — D689 Coagulation defect, unspecified: Secondary | ICD-10-CM | POA: Diagnosis not present

## 2020-10-19 DIAGNOSIS — I129 Hypertensive chronic kidney disease with stage 1 through stage 4 chronic kidney disease, or unspecified chronic kidney disease: Secondary | ICD-10-CM | POA: Diagnosis not present

## 2020-10-19 DIAGNOSIS — N2581 Secondary hyperparathyroidism of renal origin: Secondary | ICD-10-CM | POA: Diagnosis not present

## 2020-10-19 DIAGNOSIS — N186 End stage renal disease: Secondary | ICD-10-CM | POA: Diagnosis not present

## 2020-10-22 DIAGNOSIS — D689 Coagulation defect, unspecified: Secondary | ICD-10-CM | POA: Diagnosis not present

## 2020-10-22 DIAGNOSIS — N2581 Secondary hyperparathyroidism of renal origin: Secondary | ICD-10-CM | POA: Diagnosis not present

## 2020-10-22 DIAGNOSIS — D509 Iron deficiency anemia, unspecified: Secondary | ICD-10-CM | POA: Diagnosis not present

## 2020-10-22 DIAGNOSIS — Z992 Dependence on renal dialysis: Secondary | ICD-10-CM | POA: Diagnosis not present

## 2020-10-22 DIAGNOSIS — N186 End stage renal disease: Secondary | ICD-10-CM | POA: Diagnosis not present

## 2020-10-22 DIAGNOSIS — E1129 Type 2 diabetes mellitus with other diabetic kidney complication: Secondary | ICD-10-CM | POA: Diagnosis not present

## 2020-10-24 DIAGNOSIS — Z992 Dependence on renal dialysis: Secondary | ICD-10-CM | POA: Diagnosis not present

## 2020-10-24 DIAGNOSIS — D689 Coagulation defect, unspecified: Secondary | ICD-10-CM | POA: Diagnosis not present

## 2020-10-24 DIAGNOSIS — D509 Iron deficiency anemia, unspecified: Secondary | ICD-10-CM | POA: Diagnosis not present

## 2020-10-24 DIAGNOSIS — N2581 Secondary hyperparathyroidism of renal origin: Secondary | ICD-10-CM | POA: Diagnosis not present

## 2020-10-24 DIAGNOSIS — E1129 Type 2 diabetes mellitus with other diabetic kidney complication: Secondary | ICD-10-CM | POA: Diagnosis not present

## 2020-10-24 DIAGNOSIS — N186 End stage renal disease: Secondary | ICD-10-CM | POA: Diagnosis not present

## 2020-10-29 DIAGNOSIS — D689 Coagulation defect, unspecified: Secondary | ICD-10-CM | POA: Diagnosis not present

## 2020-10-29 DIAGNOSIS — N186 End stage renal disease: Secondary | ICD-10-CM | POA: Diagnosis not present

## 2020-10-29 DIAGNOSIS — N2581 Secondary hyperparathyroidism of renal origin: Secondary | ICD-10-CM | POA: Diagnosis not present

## 2020-10-29 DIAGNOSIS — Z992 Dependence on renal dialysis: Secondary | ICD-10-CM | POA: Diagnosis not present

## 2020-10-29 DIAGNOSIS — D509 Iron deficiency anemia, unspecified: Secondary | ICD-10-CM | POA: Diagnosis not present

## 2020-10-29 DIAGNOSIS — E1129 Type 2 diabetes mellitus with other diabetic kidney complication: Secondary | ICD-10-CM | POA: Diagnosis not present

## 2020-10-31 DIAGNOSIS — D509 Iron deficiency anemia, unspecified: Secondary | ICD-10-CM | POA: Diagnosis not present

## 2020-10-31 DIAGNOSIS — E1129 Type 2 diabetes mellitus with other diabetic kidney complication: Secondary | ICD-10-CM | POA: Diagnosis not present

## 2020-10-31 DIAGNOSIS — N2581 Secondary hyperparathyroidism of renal origin: Secondary | ICD-10-CM | POA: Diagnosis not present

## 2020-10-31 DIAGNOSIS — D689 Coagulation defect, unspecified: Secondary | ICD-10-CM | POA: Diagnosis not present

## 2020-10-31 DIAGNOSIS — Z992 Dependence on renal dialysis: Secondary | ICD-10-CM | POA: Diagnosis not present

## 2020-10-31 DIAGNOSIS — N186 End stage renal disease: Secondary | ICD-10-CM | POA: Diagnosis not present

## 2020-11-02 DIAGNOSIS — D689 Coagulation defect, unspecified: Secondary | ICD-10-CM | POA: Diagnosis not present

## 2020-11-02 DIAGNOSIS — N2581 Secondary hyperparathyroidism of renal origin: Secondary | ICD-10-CM | POA: Diagnosis not present

## 2020-11-02 DIAGNOSIS — E1129 Type 2 diabetes mellitus with other diabetic kidney complication: Secondary | ICD-10-CM | POA: Diagnosis not present

## 2020-11-02 DIAGNOSIS — D509 Iron deficiency anemia, unspecified: Secondary | ICD-10-CM | POA: Diagnosis not present

## 2020-11-02 DIAGNOSIS — Z992 Dependence on renal dialysis: Secondary | ICD-10-CM | POA: Diagnosis not present

## 2020-11-02 DIAGNOSIS — N186 End stage renal disease: Secondary | ICD-10-CM | POA: Diagnosis not present

## 2020-11-05 DIAGNOSIS — E1129 Type 2 diabetes mellitus with other diabetic kidney complication: Secondary | ICD-10-CM | POA: Diagnosis not present

## 2020-11-05 DIAGNOSIS — N2581 Secondary hyperparathyroidism of renal origin: Secondary | ICD-10-CM | POA: Diagnosis not present

## 2020-11-05 DIAGNOSIS — Z992 Dependence on renal dialysis: Secondary | ICD-10-CM | POA: Diagnosis not present

## 2020-11-05 DIAGNOSIS — D689 Coagulation defect, unspecified: Secondary | ICD-10-CM | POA: Diagnosis not present

## 2020-11-05 DIAGNOSIS — D509 Iron deficiency anemia, unspecified: Secondary | ICD-10-CM | POA: Diagnosis not present

## 2020-11-05 DIAGNOSIS — N186 End stage renal disease: Secondary | ICD-10-CM | POA: Diagnosis not present

## 2020-11-06 DIAGNOSIS — Z23 Encounter for immunization: Secondary | ICD-10-CM | POA: Diagnosis not present

## 2020-11-06 DIAGNOSIS — Z992 Dependence on renal dialysis: Secondary | ICD-10-CM | POA: Diagnosis not present

## 2020-11-06 DIAGNOSIS — E1169 Type 2 diabetes mellitus with other specified complication: Secondary | ICD-10-CM | POA: Diagnosis not present

## 2020-11-06 DIAGNOSIS — I739 Peripheral vascular disease, unspecified: Secondary | ICD-10-CM | POA: Diagnosis not present

## 2020-11-09 DIAGNOSIS — E1129 Type 2 diabetes mellitus with other diabetic kidney complication: Secondary | ICD-10-CM | POA: Diagnosis not present

## 2020-11-09 DIAGNOSIS — D509 Iron deficiency anemia, unspecified: Secondary | ICD-10-CM | POA: Diagnosis not present

## 2020-11-09 DIAGNOSIS — D689 Coagulation defect, unspecified: Secondary | ICD-10-CM | POA: Diagnosis not present

## 2020-11-09 DIAGNOSIS — N186 End stage renal disease: Secondary | ICD-10-CM | POA: Diagnosis not present

## 2020-11-09 DIAGNOSIS — N2581 Secondary hyperparathyroidism of renal origin: Secondary | ICD-10-CM | POA: Diagnosis not present

## 2020-11-09 DIAGNOSIS — Z992 Dependence on renal dialysis: Secondary | ICD-10-CM | POA: Diagnosis not present

## 2020-11-12 DIAGNOSIS — N186 End stage renal disease: Secondary | ICD-10-CM | POA: Diagnosis not present

## 2020-11-12 DIAGNOSIS — D509 Iron deficiency anemia, unspecified: Secondary | ICD-10-CM | POA: Diagnosis not present

## 2020-11-12 DIAGNOSIS — E1129 Type 2 diabetes mellitus with other diabetic kidney complication: Secondary | ICD-10-CM | POA: Diagnosis not present

## 2020-11-12 DIAGNOSIS — D689 Coagulation defect, unspecified: Secondary | ICD-10-CM | POA: Diagnosis not present

## 2020-11-12 DIAGNOSIS — N2581 Secondary hyperparathyroidism of renal origin: Secondary | ICD-10-CM | POA: Diagnosis not present

## 2020-11-12 DIAGNOSIS — Z992 Dependence on renal dialysis: Secondary | ICD-10-CM | POA: Diagnosis not present

## 2020-11-14 DIAGNOSIS — Z992 Dependence on renal dialysis: Secondary | ICD-10-CM | POA: Diagnosis not present

## 2020-11-14 DIAGNOSIS — E1129 Type 2 diabetes mellitus with other diabetic kidney complication: Secondary | ICD-10-CM | POA: Diagnosis not present

## 2020-11-14 DIAGNOSIS — N186 End stage renal disease: Secondary | ICD-10-CM | POA: Diagnosis not present

## 2020-11-14 DIAGNOSIS — D689 Coagulation defect, unspecified: Secondary | ICD-10-CM | POA: Diagnosis not present

## 2020-11-14 DIAGNOSIS — N2581 Secondary hyperparathyroidism of renal origin: Secondary | ICD-10-CM | POA: Diagnosis not present

## 2020-11-14 DIAGNOSIS — D509 Iron deficiency anemia, unspecified: Secondary | ICD-10-CM | POA: Diagnosis not present

## 2020-11-16 DIAGNOSIS — E1129 Type 2 diabetes mellitus with other diabetic kidney complication: Secondary | ICD-10-CM | POA: Diagnosis not present

## 2020-11-16 DIAGNOSIS — D509 Iron deficiency anemia, unspecified: Secondary | ICD-10-CM | POA: Diagnosis not present

## 2020-11-16 DIAGNOSIS — N186 End stage renal disease: Secondary | ICD-10-CM | POA: Diagnosis not present

## 2020-11-16 DIAGNOSIS — Z992 Dependence on renal dialysis: Secondary | ICD-10-CM | POA: Diagnosis not present

## 2020-11-16 DIAGNOSIS — D689 Coagulation defect, unspecified: Secondary | ICD-10-CM | POA: Diagnosis not present

## 2020-11-16 DIAGNOSIS — N2581 Secondary hyperparathyroidism of renal origin: Secondary | ICD-10-CM | POA: Diagnosis not present

## 2020-11-19 DIAGNOSIS — N2581 Secondary hyperparathyroidism of renal origin: Secondary | ICD-10-CM | POA: Diagnosis not present

## 2020-11-19 DIAGNOSIS — I129 Hypertensive chronic kidney disease with stage 1 through stage 4 chronic kidney disease, or unspecified chronic kidney disease: Secondary | ICD-10-CM | POA: Diagnosis not present

## 2020-11-19 DIAGNOSIS — D509 Iron deficiency anemia, unspecified: Secondary | ICD-10-CM | POA: Diagnosis not present

## 2020-11-19 DIAGNOSIS — N186 End stage renal disease: Secondary | ICD-10-CM | POA: Diagnosis not present

## 2020-11-19 DIAGNOSIS — D689 Coagulation defect, unspecified: Secondary | ICD-10-CM | POA: Diagnosis not present

## 2020-11-19 DIAGNOSIS — Z992 Dependence on renal dialysis: Secondary | ICD-10-CM | POA: Diagnosis not present

## 2020-11-19 DIAGNOSIS — E1129 Type 2 diabetes mellitus with other diabetic kidney complication: Secondary | ICD-10-CM | POA: Diagnosis not present

## 2020-11-21 DIAGNOSIS — N186 End stage renal disease: Secondary | ICD-10-CM | POA: Diagnosis not present

## 2020-11-21 DIAGNOSIS — N2581 Secondary hyperparathyroidism of renal origin: Secondary | ICD-10-CM | POA: Diagnosis not present

## 2020-11-21 DIAGNOSIS — Z992 Dependence on renal dialysis: Secondary | ICD-10-CM | POA: Diagnosis not present

## 2020-11-21 DIAGNOSIS — D509 Iron deficiency anemia, unspecified: Secondary | ICD-10-CM | POA: Diagnosis not present

## 2020-11-21 DIAGNOSIS — D689 Coagulation defect, unspecified: Secondary | ICD-10-CM | POA: Diagnosis not present

## 2020-11-23 ENCOUNTER — Other Ambulatory Visit: Payer: Self-pay

## 2020-11-23 DIAGNOSIS — I739 Peripheral vascular disease, unspecified: Secondary | ICD-10-CM

## 2020-11-26 DIAGNOSIS — N186 End stage renal disease: Secondary | ICD-10-CM | POA: Diagnosis not present

## 2020-11-26 DIAGNOSIS — D509 Iron deficiency anemia, unspecified: Secondary | ICD-10-CM | POA: Diagnosis not present

## 2020-11-26 DIAGNOSIS — Z992 Dependence on renal dialysis: Secondary | ICD-10-CM | POA: Diagnosis not present

## 2020-11-26 DIAGNOSIS — N2581 Secondary hyperparathyroidism of renal origin: Secondary | ICD-10-CM | POA: Diagnosis not present

## 2020-11-26 DIAGNOSIS — D689 Coagulation defect, unspecified: Secondary | ICD-10-CM | POA: Diagnosis not present

## 2020-11-28 DIAGNOSIS — Z992 Dependence on renal dialysis: Secondary | ICD-10-CM | POA: Diagnosis not present

## 2020-11-28 DIAGNOSIS — N186 End stage renal disease: Secondary | ICD-10-CM | POA: Diagnosis not present

## 2020-11-28 DIAGNOSIS — D509 Iron deficiency anemia, unspecified: Secondary | ICD-10-CM | POA: Diagnosis not present

## 2020-11-28 DIAGNOSIS — D689 Coagulation defect, unspecified: Secondary | ICD-10-CM | POA: Diagnosis not present

## 2020-11-28 DIAGNOSIS — N2581 Secondary hyperparathyroidism of renal origin: Secondary | ICD-10-CM | POA: Diagnosis not present

## 2020-11-30 DIAGNOSIS — N186 End stage renal disease: Secondary | ICD-10-CM | POA: Diagnosis not present

## 2020-11-30 DIAGNOSIS — Z992 Dependence on renal dialysis: Secondary | ICD-10-CM | POA: Diagnosis not present

## 2020-11-30 DIAGNOSIS — D689 Coagulation defect, unspecified: Secondary | ICD-10-CM | POA: Diagnosis not present

## 2020-11-30 DIAGNOSIS — N2581 Secondary hyperparathyroidism of renal origin: Secondary | ICD-10-CM | POA: Diagnosis not present

## 2020-11-30 DIAGNOSIS — D509 Iron deficiency anemia, unspecified: Secondary | ICD-10-CM | POA: Diagnosis not present

## 2020-12-03 DIAGNOSIS — Z992 Dependence on renal dialysis: Secondary | ICD-10-CM | POA: Diagnosis not present

## 2020-12-03 DIAGNOSIS — N2581 Secondary hyperparathyroidism of renal origin: Secondary | ICD-10-CM | POA: Diagnosis not present

## 2020-12-03 DIAGNOSIS — D689 Coagulation defect, unspecified: Secondary | ICD-10-CM | POA: Diagnosis not present

## 2020-12-03 DIAGNOSIS — D509 Iron deficiency anemia, unspecified: Secondary | ICD-10-CM | POA: Diagnosis not present

## 2020-12-03 DIAGNOSIS — N186 End stage renal disease: Secondary | ICD-10-CM | POA: Diagnosis not present

## 2020-12-04 ENCOUNTER — Ambulatory Visit: Payer: Medicare Other | Admitting: Vascular Surgery

## 2020-12-04 ENCOUNTER — Encounter: Payer: Medicare Other | Admitting: Family Medicine

## 2020-12-04 ENCOUNTER — Encounter (HOSPITAL_COMMUNITY): Payer: Medicare Other

## 2020-12-04 NOTE — Progress Notes (Deleted)
GYNECOLOGY OFFICE VISIT NOTE  History:   Chelsea Roberts is a 65 y.o. here today for establishing care for an annual exam. She denies any abnormal vaginal discharge, bleeding, pelvic pain or other concerns.      Past Medical History:  Diagnosis Date   Anxiety    Arthritis    Asthma    due to sesonal allergies   Breast mass    Chronic kidney disease    MWF   Complication of anesthesia    severe htn-had to take 2 ativan preop   Depression    Diabetes mellitus without complication (Chelsea Roberts)    during gout attack   Gout    Hyperlipidemia    Hypertension    Pre-diabetes    Sleep apnea    NO CPAP use per pt., lost 70lbs   Stroke [facility]) 2013   "several  small"    Systolic CHF, chronic (Chelsea Roberts) 2013    Past Surgical History:  Procedure Laterality Date   A/V FISTULAGRAM Left 12/13/2018   Procedure: A/V FISTULAGRAM;  Surgeon: Chelsea Sandy, MD;  Location: [facility];  Service: Cardiovascular;  Laterality: Left;   ABDOMINAL HYSTERECTOMY  1999   partial   AV FISTULA PLACEMENT Left 09/01/2018   Procedure: CREATION OF ARTERIOVENOUS (AV) FISTULA LEFT ARM;  Surgeon: Chelsea Posner, MD;  Location: [facility];  Service: Vascular;  Laterality: Left;   BREAST BIOPSY  01/01/2012   Procedure: Left BREAST BIOPSY WITH NEEDLE LOCALIZATION;  Surgeon: Chelsea Lasso, MD;  Location: [facility];  Service: General;  Laterality: Right;  Needle localization excision Right breast mass   BREAST BIOPSY Right 2019   BREAST LUMPECTOMY WITH RADIOACTIVE SEED LOCALIZATION Left 10/09/2017   Procedure: BREAST LUMPECTOMY WITH RADIOACTIVE SEED LOCALIZATION;  Surgeon: Chelsea Kussmaul, MD;  Location: [facility];  Service: General;  Laterality: Left;   COLONOSCOPY  2019   FISTULA SUPERFICIALIZATION Left 12/15/2018   Procedure: FISTULA SUPERFICIALIZATION LEFT ARM;  Surgeon: Chelsea Sandy, MD;  Location: [facility];  Service: Vascular;  Laterality: Left;   IR FLUORO  GUIDE CV LINE RIGHT  10/12/2018   IR US GUIDE VASC ACCESS RIGHT  10/12/2018    The following portions of the patient's history were reviewed and updated as appropriate: allergies, current medications, past family history, past medical history, past social history, past surgical history and problem list.   Health Maintenance:  Normal pap and negative HRHPV on ***.  Normal mammogram on ***.   Review of Systems:  Pertinent items noted in HPI and remainder of comprehensive ROS otherwise negative.  Physical Exam:  There were no vitals taken for this visit. CONSTITUTIONAL: Well-developed, well-nourished female in no acute distress.  HEENT:  Normocephalic, atraumatic. External right and left ear normal. No scleral icterus.  NECK: Normal range of motion, supple, no masses noted on observation SKIN: No rash noted. Not diaphoretic. No erythema. No pallor. MUSCULOSKELETAL: Normal range of motion. No edema noted. NEUROLOGIC: Alert and oriented to person, place, and time. Normal muscle tone coordination.  PSYCHIATRIC: Normal mood and affect. Normal behavior. Normal judgment and thought content. CARDIOVASCULAR: Normal heart rate noted RESPIRATORY: Effort and breath sounds normal, no problems with respiration noted ABDOMEN: No masses noted. No other overt distention noted.   PELVIC: {Blank single:[id number]::"Deferred","Normal appearing external genitalia; normal urethral meatus; normal appearing vaginal mucosa and cervix.  No abnormal discharge noted.  Normal uterine size, no other palpable masses, no uterine or adnexal tenderness. Performed in  the presence of a chaperone"}  Labs and Imaging No results found for this or any previous visit (from the past 168 hour(s)). No results found.    Assessment and Plan:    There are no diagnoses linked to this encounter.  Routine preventative health maintenance measures emphasized. Please refer to After Visit Summary for other counseling recommendations.   No  follow-ups on file.     Chelsea Hillock, DO  OB Fellow, [place] for Chelsea Roberts 12/04/2020 8:36 AM

## 2020-12-05 DIAGNOSIS — D689 Coagulation defect, unspecified: Secondary | ICD-10-CM | POA: Diagnosis not present

## 2020-12-05 DIAGNOSIS — N2581 Secondary hyperparathyroidism of renal origin: Secondary | ICD-10-CM | POA: Diagnosis not present

## 2020-12-05 DIAGNOSIS — N186 End stage renal disease: Secondary | ICD-10-CM | POA: Diagnosis not present

## 2020-12-05 DIAGNOSIS — D509 Iron deficiency anemia, unspecified: Secondary | ICD-10-CM | POA: Diagnosis not present

## 2020-12-05 DIAGNOSIS — Z992 Dependence on renal dialysis: Secondary | ICD-10-CM | POA: Diagnosis not present

## 2020-12-10 DIAGNOSIS — D509 Iron deficiency anemia, unspecified: Secondary | ICD-10-CM | POA: Diagnosis not present

## 2020-12-10 DIAGNOSIS — Z992 Dependence on renal dialysis: Secondary | ICD-10-CM | POA: Diagnosis not present

## 2020-12-10 DIAGNOSIS — N2581 Secondary hyperparathyroidism of renal origin: Secondary | ICD-10-CM | POA: Diagnosis not present

## 2020-12-10 DIAGNOSIS — D689 Coagulation defect, unspecified: Secondary | ICD-10-CM | POA: Diagnosis not present

## 2020-12-10 DIAGNOSIS — N186 End stage renal disease: Secondary | ICD-10-CM | POA: Diagnosis not present

## 2020-12-12 DIAGNOSIS — D689 Coagulation defect, unspecified: Secondary | ICD-10-CM | POA: Diagnosis not present

## 2020-12-12 DIAGNOSIS — Z992 Dependence on renal dialysis: Secondary | ICD-10-CM | POA: Diagnosis not present

## 2020-12-12 DIAGNOSIS — N2581 Secondary hyperparathyroidism of renal origin: Secondary | ICD-10-CM | POA: Diagnosis not present

## 2020-12-12 DIAGNOSIS — D509 Iron deficiency anemia, unspecified: Secondary | ICD-10-CM | POA: Diagnosis not present

## 2020-12-12 DIAGNOSIS — N186 End stage renal disease: Secondary | ICD-10-CM | POA: Diagnosis not present

## 2020-12-15 DIAGNOSIS — D689 Coagulation defect, unspecified: Secondary | ICD-10-CM | POA: Diagnosis not present

## 2020-12-15 DIAGNOSIS — N186 End stage renal disease: Secondary | ICD-10-CM | POA: Diagnosis not present

## 2020-12-15 DIAGNOSIS — D509 Iron deficiency anemia, unspecified: Secondary | ICD-10-CM | POA: Diagnosis not present

## 2020-12-15 DIAGNOSIS — N2581 Secondary hyperparathyroidism of renal origin: Secondary | ICD-10-CM | POA: Diagnosis not present

## 2020-12-15 DIAGNOSIS — Z992 Dependence on renal dialysis: Secondary | ICD-10-CM | POA: Diagnosis not present

## 2020-12-17 ENCOUNTER — Emergency Department (HOSPITAL_COMMUNITY)
Admission: EM | Admit: 2020-12-17 | Discharge: 2020-12-17 | Discharge status: Against Medical Advice | Payer: Medicare Other | Attending: Student | Admitting: Student

## 2020-12-17 ENCOUNTER — Other Ambulatory Visit: Payer: Self-pay

## 2020-12-17 ENCOUNTER — Emergency Department (HOSPITAL_COMMUNITY): Payer: Medicare Other

## 2020-12-17 ENCOUNTER — Encounter (HOSPITAL_COMMUNITY): Payer: Self-pay

## 2020-12-17 DIAGNOSIS — Z041 Encounter for examination and observation following transport accident: Secondary | ICD-10-CM | POA: Diagnosis present

## 2020-12-17 DIAGNOSIS — M549 Dorsalgia, unspecified: Secondary | ICD-10-CM

## 2020-12-17 DIAGNOSIS — R079 Chest pain, unspecified: Secondary | ICD-10-CM | POA: Diagnosis not present

## 2020-12-17 DIAGNOSIS — M546 Pain in thoracic spine: Secondary | ICD-10-CM | POA: Diagnosis not present

## 2020-12-17 DIAGNOSIS — T1490XA Injury, unspecified, initial encounter: Secondary | ICD-10-CM

## 2020-12-17 DIAGNOSIS — Z5321 Procedure and treatment not carried out due to patient leaving prior to being seen by health care provider: Secondary | ICD-10-CM | POA: Diagnosis not present

## 2020-12-17 DIAGNOSIS — N186 End stage renal disease: Secondary | ICD-10-CM | POA: Diagnosis not present

## 2020-12-17 DIAGNOSIS — D509 Iron deficiency anemia, unspecified: Secondary | ICD-10-CM | POA: Diagnosis not present

## 2020-12-17 DIAGNOSIS — M25519 Pain in unspecified shoulder: Secondary | ICD-10-CM | POA: Diagnosis not present

## 2020-12-17 DIAGNOSIS — M25512 Pain in left shoulder: Secondary | ICD-10-CM | POA: Insufficient documentation

## 2020-12-17 DIAGNOSIS — Y92410 Unspecified street and highway as the place of occurrence of the external cause: Secondary | ICD-10-CM | POA: Diagnosis not present

## 2020-12-17 DIAGNOSIS — D689 Coagulation defect, unspecified: Secondary | ICD-10-CM | POA: Diagnosis not present

## 2020-12-17 DIAGNOSIS — N2581 Secondary hyperparathyroidism of renal origin: Secondary | ICD-10-CM | POA: Diagnosis not present

## 2020-12-17 DIAGNOSIS — R0789 Other chest pain: Secondary | ICD-10-CM | POA: Diagnosis not present

## 2020-12-17 DIAGNOSIS — I10 Essential (primary) hypertension: Secondary | ICD-10-CM | POA: Diagnosis not present

## 2020-12-17 DIAGNOSIS — Z992 Dependence on renal dialysis: Secondary | ICD-10-CM | POA: Diagnosis not present

## 2020-12-17 MED ORDER — ACETAMINOPHEN 500 MG PO TABS: 1000.0000 mg | ORAL_TABLET | Freq: Once | ORAL | Status: DC
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
## 2020-12-19 DIAGNOSIS — I129 Hypertensive chronic kidney disease with stage 1 through stage 4 chronic kidney disease, or unspecified chronic kidney disease: Secondary | ICD-10-CM | POA: Diagnosis not present

## 2020-12-19 DIAGNOSIS — N186 End stage renal disease: Secondary | ICD-10-CM | POA: Diagnosis not present

## 2020-12-19 DIAGNOSIS — Z992 Dependence on renal dialysis: Secondary | ICD-10-CM | POA: Diagnosis not present

## 2020-12-20 DIAGNOSIS — I129 Hypertensive chronic kidney disease with stage 1 through stage 4 chronic kidney disease, or unspecified chronic kidney disease: Secondary | ICD-10-CM | POA: Diagnosis not present

## 2020-12-20 DIAGNOSIS — N186 End stage renal disease: Secondary | ICD-10-CM | POA: Diagnosis not present

## 2020-12-20 DIAGNOSIS — Z992 Dependence on renal dialysis: Secondary | ICD-10-CM | POA: Diagnosis not present

## 2020-12-21 DIAGNOSIS — N186 End stage renal disease: Secondary | ICD-10-CM | POA: Diagnosis not present

## 2020-12-21 DIAGNOSIS — D509 Iron deficiency anemia, unspecified: Secondary | ICD-10-CM | POA: Diagnosis not present

## 2020-12-21 DIAGNOSIS — N2581 Secondary hyperparathyroidism of renal origin: Secondary | ICD-10-CM | POA: Diagnosis not present

## 2020-12-21 DIAGNOSIS — D631 Anemia in chronic kidney disease: Secondary | ICD-10-CM | POA: Diagnosis not present

## 2020-12-21 DIAGNOSIS — Z992 Dependence on renal dialysis: Secondary | ICD-10-CM | POA: Diagnosis not present

## 2020-12-21 DIAGNOSIS — D689 Coagulation defect, unspecified: Secondary | ICD-10-CM | POA: Diagnosis not present

## 2020-12-24 DIAGNOSIS — D509 Iron deficiency anemia, unspecified: Secondary | ICD-10-CM | POA: Diagnosis not present

## 2020-12-24 DIAGNOSIS — D689 Coagulation defect, unspecified: Secondary | ICD-10-CM | POA: Diagnosis not present

## 2020-12-24 DIAGNOSIS — Z992 Dependence on renal dialysis: Secondary | ICD-10-CM | POA: Diagnosis not present

## 2020-12-24 DIAGNOSIS — N2581 Secondary hyperparathyroidism of renal origin: Secondary | ICD-10-CM | POA: Diagnosis not present

## 2020-12-24 DIAGNOSIS — N186 End stage renal disease: Secondary | ICD-10-CM | POA: Diagnosis not present

## 2020-12-24 DIAGNOSIS — D631 Anemia in chronic kidney disease: Secondary | ICD-10-CM | POA: Diagnosis not present

## 2020-12-25 DIAGNOSIS — I10 Essential (primary) hypertension: Secondary | ICD-10-CM | POA: Diagnosis not present

## 2020-12-25 DIAGNOSIS — J069 Acute upper respiratory infection, unspecified: Secondary | ICD-10-CM | POA: Diagnosis not present

## 2020-12-26 DIAGNOSIS — N186 End stage renal disease: Secondary | ICD-10-CM | POA: Diagnosis not present

## 2020-12-26 DIAGNOSIS — N2581 Secondary hyperparathyroidism of renal origin: Secondary | ICD-10-CM | POA: Diagnosis not present

## 2020-12-26 DIAGNOSIS — D509 Iron deficiency anemia, unspecified: Secondary | ICD-10-CM | POA: Diagnosis not present

## 2020-12-26 DIAGNOSIS — Z992 Dependence on renal dialysis: Secondary | ICD-10-CM | POA: Diagnosis not present

## 2020-12-26 DIAGNOSIS — D631 Anemia in chronic kidney disease: Secondary | ICD-10-CM | POA: Diagnosis not present

## 2020-12-26 DIAGNOSIS — D689 Coagulation defect, unspecified: Secondary | ICD-10-CM | POA: Diagnosis not present

## 2020-12-27 ENCOUNTER — Other Ambulatory Visit: Payer: Self-pay | Admitting: Family Medicine

## 2020-12-27 ENCOUNTER — Ambulatory Visit
Admission: RE | Admit: 2020-12-27 | Discharge: 2020-12-27 | Disposition: A | Discharge status: Home / Self Care | Payer: Medicare Other | Source: Ambulatory Visit | Attending: Family Medicine | Admitting: Family Medicine

## 2020-12-27 ENCOUNTER — Other Ambulatory Visit: Payer: Self-pay

## 2020-12-27 DIAGNOSIS — M4312 Spondylolisthesis, cervical region: Secondary | ICD-10-CM | POA: Diagnosis not present

## 2020-12-27 DIAGNOSIS — M549 Dorsalgia, unspecified: Secondary | ICD-10-CM | POA: Diagnosis not present

## 2020-12-27 DIAGNOSIS — Z041 Encounter for examination and observation following transport accident: Secondary | ICD-10-CM | POA: Diagnosis not present

## 2020-12-27 DIAGNOSIS — R0781 Pleurodynia: Secondary | ICD-10-CM | POA: Diagnosis not present

## 2020-12-28 DIAGNOSIS — D689 Coagulation defect, unspecified: Secondary | ICD-10-CM | POA: Diagnosis not present

## 2020-12-28 DIAGNOSIS — N2581 Secondary hyperparathyroidism of renal origin: Secondary | ICD-10-CM | POA: Diagnosis not present

## 2020-12-28 DIAGNOSIS — N186 End stage renal disease: Secondary | ICD-10-CM | POA: Diagnosis not present

## 2020-12-28 DIAGNOSIS — Z992 Dependence on renal dialysis: Secondary | ICD-10-CM | POA: Diagnosis not present

## 2020-12-28 DIAGNOSIS — D631 Anemia in chronic kidney disease: Secondary | ICD-10-CM | POA: Diagnosis not present

## 2020-12-28 DIAGNOSIS — D509 Iron deficiency anemia, unspecified: Secondary | ICD-10-CM | POA: Diagnosis not present

## 2020-12-31 DIAGNOSIS — N186 End stage renal disease: Secondary | ICD-10-CM | POA: Diagnosis not present

## 2020-12-31 DIAGNOSIS — D689 Coagulation defect, unspecified: Secondary | ICD-10-CM | POA: Diagnosis not present

## 2020-12-31 DIAGNOSIS — Z992 Dependence on renal dialysis: Secondary | ICD-10-CM | POA: Diagnosis not present

## 2020-12-31 DIAGNOSIS — D509 Iron deficiency anemia, unspecified: Secondary | ICD-10-CM | POA: Diagnosis not present

## 2020-12-31 DIAGNOSIS — D631 Anemia in chronic kidney disease: Secondary | ICD-10-CM | POA: Diagnosis not present

## 2020-12-31 DIAGNOSIS — N2581 Secondary hyperparathyroidism of renal origin: Secondary | ICD-10-CM | POA: Diagnosis not present

## 2021-01-02 DIAGNOSIS — D689 Coagulation defect, unspecified: Secondary | ICD-10-CM | POA: Diagnosis not present

## 2021-01-02 DIAGNOSIS — D509 Iron deficiency anemia, unspecified: Secondary | ICD-10-CM | POA: Diagnosis not present

## 2021-01-02 DIAGNOSIS — N2581 Secondary hyperparathyroidism of renal origin: Secondary | ICD-10-CM | POA: Diagnosis not present

## 2021-01-02 DIAGNOSIS — Z992 Dependence on renal dialysis: Secondary | ICD-10-CM | POA: Diagnosis not present

## 2021-01-02 DIAGNOSIS — N186 End stage renal disease: Secondary | ICD-10-CM | POA: Diagnosis not present

## 2021-01-02 DIAGNOSIS — D631 Anemia in chronic kidney disease: Secondary | ICD-10-CM | POA: Diagnosis not present

## 2021-01-07 DIAGNOSIS — N186 End stage renal disease: Secondary | ICD-10-CM | POA: Diagnosis not present

## 2021-01-07 DIAGNOSIS — D689 Coagulation defect, unspecified: Secondary | ICD-10-CM | POA: Diagnosis not present

## 2021-01-07 DIAGNOSIS — D509 Iron deficiency anemia, unspecified: Secondary | ICD-10-CM | POA: Diagnosis not present

## 2021-01-07 DIAGNOSIS — Z992 Dependence on renal dialysis: Secondary | ICD-10-CM | POA: Diagnosis not present

## 2021-01-07 DIAGNOSIS — N2581 Secondary hyperparathyroidism of renal origin: Secondary | ICD-10-CM | POA: Diagnosis not present

## 2021-01-07 DIAGNOSIS — D631 Anemia in chronic kidney disease: Secondary | ICD-10-CM | POA: Diagnosis not present

## 2021-01-09 DIAGNOSIS — D631 Anemia in chronic kidney disease: Secondary | ICD-10-CM | POA: Diagnosis not present

## 2021-01-09 DIAGNOSIS — Z992 Dependence on renal dialysis: Secondary | ICD-10-CM | POA: Diagnosis not present

## 2021-01-09 DIAGNOSIS — D689 Coagulation defect, unspecified: Secondary | ICD-10-CM | POA: Diagnosis not present

## 2021-01-09 DIAGNOSIS — N186 End stage renal disease: Secondary | ICD-10-CM | POA: Diagnosis not present

## 2021-01-09 DIAGNOSIS — D509 Iron deficiency anemia, unspecified: Secondary | ICD-10-CM | POA: Diagnosis not present

## 2021-01-09 DIAGNOSIS — N2581 Secondary hyperparathyroidism of renal origin: Secondary | ICD-10-CM | POA: Diagnosis not present

## 2021-01-14 DIAGNOSIS — N2581 Secondary hyperparathyroidism of renal origin: Secondary | ICD-10-CM | POA: Diagnosis not present

## 2021-01-14 DIAGNOSIS — Z992 Dependence on renal dialysis: Secondary | ICD-10-CM | POA: Diagnosis not present

## 2021-01-14 DIAGNOSIS — N186 End stage renal disease: Secondary | ICD-10-CM | POA: Diagnosis not present

## 2021-01-14 DIAGNOSIS — D631 Anemia in chronic kidney disease: Secondary | ICD-10-CM | POA: Diagnosis not present

## 2021-01-14 DIAGNOSIS — D689 Coagulation defect, unspecified: Secondary | ICD-10-CM | POA: Diagnosis not present

## 2021-01-14 DIAGNOSIS — D509 Iron deficiency anemia, unspecified: Secondary | ICD-10-CM | POA: Diagnosis not present

## 2021-01-15 DIAGNOSIS — D84821 Immunodeficiency due to drugs: Secondary | ICD-10-CM | POA: Diagnosis not present

## 2021-01-15 DIAGNOSIS — D631 Anemia in chronic kidney disease: Secondary | ICD-10-CM | POA: Diagnosis not present

## 2021-01-15 DIAGNOSIS — I120 Hypertensive chronic kidney disease with stage 5 chronic kidney disease or end stage renal disease: Secondary | ICD-10-CM | POA: Diagnosis not present

## 2021-01-15 DIAGNOSIS — R638 Other symptoms and signs concerning food and fluid intake: Secondary | ICD-10-CM | POA: Diagnosis not present

## 2021-01-15 DIAGNOSIS — Z01818 Encounter for other preprocedural examination: Secondary | ICD-10-CM | POA: Diagnosis not present

## 2021-01-15 DIAGNOSIS — Z992 Dependence on renal dialysis: Secondary | ICD-10-CM | POA: Diagnosis not present

## 2021-01-15 DIAGNOSIS — E873 Alkalosis: Secondary | ICD-10-CM | POA: Diagnosis not present

## 2021-01-15 DIAGNOSIS — J9811 Atelectasis: Secondary | ICD-10-CM | POA: Diagnosis not present

## 2021-01-15 DIAGNOSIS — Z4822 Encounter for aftercare following kidney transplant: Secondary | ICD-10-CM | POA: Diagnosis not present

## 2021-01-15 DIAGNOSIS — I1311 Hypertensive heart and chronic kidney disease without heart failure, with stage 5 chronic kidney disease, or end stage renal disease: Secondary | ICD-10-CM | POA: Diagnosis not present

## 2021-01-15 DIAGNOSIS — Z7952 Long term (current) use of systemic steroids: Secondary | ICD-10-CM | POA: Diagnosis not present

## 2021-01-15 DIAGNOSIS — I440 Atrioventricular block, first degree: Secondary | ICD-10-CM | POA: Diagnosis not present

## 2021-01-15 DIAGNOSIS — I2510 Atherosclerotic heart disease of native coronary artery without angina pectoris: Secondary | ICD-10-CM | POA: Diagnosis not present

## 2021-01-15 DIAGNOSIS — T8619 Other complication of kidney transplant: Secondary | ICD-10-CM | POA: Diagnosis not present

## 2021-01-15 DIAGNOSIS — Z5181 Encounter for therapeutic drug level monitoring: Secondary | ICD-10-CM | POA: Diagnosis not present

## 2021-01-15 DIAGNOSIS — D62 Acute posthemorrhagic anemia: Secondary | ICD-10-CM | POA: Diagnosis not present

## 2021-01-15 DIAGNOSIS — Z8673 Personal history of transient ischemic attack (TIA), and cerebral infarction without residual deficits: Secondary | ICD-10-CM | POA: Diagnosis not present

## 2021-01-15 DIAGNOSIS — Z792 Long term (current) use of antibiotics: Secondary | ICD-10-CM | POA: Diagnosis not present

## 2021-01-15 DIAGNOSIS — Z79621 Long term (current) use of calcineurin inhibitor: Secondary | ICD-10-CM | POA: Diagnosis not present

## 2021-01-15 DIAGNOSIS — Z940 Kidney transplant status: Secondary | ICD-10-CM | POA: Diagnosis not present

## 2021-01-15 DIAGNOSIS — N186 End stage renal disease: Secondary | ICD-10-CM | POA: Diagnosis not present

## 2021-01-15 DIAGNOSIS — Z20822 Contact with and (suspected) exposure to covid-19: Secondary | ICD-10-CM | POA: Diagnosis not present

## 2021-01-16 DIAGNOSIS — Z7952 Long term (current) use of systemic steroids: Secondary | ICD-10-CM | POA: Diagnosis not present

## 2021-01-16 DIAGNOSIS — I120 Hypertensive chronic kidney disease with stage 5 chronic kidney disease or end stage renal disease: Secondary | ICD-10-CM | POA: Diagnosis not present

## 2021-01-16 DIAGNOSIS — R638 Other symptoms and signs concerning food and fluid intake: Secondary | ICD-10-CM | POA: Diagnosis not present

## 2021-01-16 DIAGNOSIS — Z792 Long term (current) use of antibiotics: Secondary | ICD-10-CM | POA: Diagnosis not present

## 2021-01-16 DIAGNOSIS — Z4822 Encounter for aftercare following kidney transplant: Secondary | ICD-10-CM | POA: Diagnosis not present

## 2021-01-16 DIAGNOSIS — Z79621 Long term (current) use of calcineurin inhibitor: Secondary | ICD-10-CM | POA: Diagnosis not present

## 2021-01-16 DIAGNOSIS — Z5181 Encounter for therapeutic drug level monitoring: Secondary | ICD-10-CM | POA: Diagnosis not present

## 2021-01-16 DIAGNOSIS — Z940 Kidney transplant status: Secondary | ICD-10-CM | POA: Diagnosis not present

## 2021-01-17 DIAGNOSIS — D62 Acute posthemorrhagic anemia: Secondary | ICD-10-CM | POA: Diagnosis not present

## 2021-01-17 DIAGNOSIS — Z992 Dependence on renal dialysis: Secondary | ICD-10-CM | POA: Diagnosis not present

## 2021-01-17 DIAGNOSIS — Z940 Kidney transplant status: Secondary | ICD-10-CM | POA: Diagnosis not present

## 2021-01-17 DIAGNOSIS — T8619 Other complication of kidney transplant: Secondary | ICD-10-CM | POA: Diagnosis not present

## 2021-01-17 DIAGNOSIS — Z5181 Encounter for therapeutic drug level monitoring: Secondary | ICD-10-CM | POA: Diagnosis not present

## 2021-01-17 DIAGNOSIS — I440 Atrioventricular block, first degree: Secondary | ICD-10-CM | POA: Diagnosis not present

## 2021-01-17 DIAGNOSIS — N186 End stage renal disease: Secondary | ICD-10-CM | POA: Diagnosis not present

## 2021-01-17 DIAGNOSIS — Z4822 Encounter for aftercare following kidney transplant: Secondary | ICD-10-CM | POA: Diagnosis not present

## 2021-01-18 DIAGNOSIS — Z4822 Encounter for aftercare following kidney transplant: Secondary | ICD-10-CM | POA: Diagnosis not present

## 2021-01-18 DIAGNOSIS — Z992 Dependence on renal dialysis: Secondary | ICD-10-CM | POA: Diagnosis not present

## 2021-01-18 DIAGNOSIS — Z940 Kidney transplant status: Secondary | ICD-10-CM | POA: Diagnosis not present

## 2021-01-18 DIAGNOSIS — D84821 Immunodeficiency due to drugs: Secondary | ICD-10-CM | POA: Diagnosis not present

## 2021-01-18 DIAGNOSIS — T8619 Other complication of kidney transplant: Secondary | ICD-10-CM | POA: Diagnosis not present

## 2021-01-18 DIAGNOSIS — I120 Hypertensive chronic kidney disease with stage 5 chronic kidney disease or end stage renal disease: Secondary | ICD-10-CM | POA: Diagnosis not present

## 2021-01-18 DIAGNOSIS — Z5181 Encounter for therapeutic drug level monitoring: Secondary | ICD-10-CM | POA: Diagnosis not present

## 2021-01-18 DIAGNOSIS — N186 End stage renal disease: Secondary | ICD-10-CM | POA: Diagnosis not present

## 2021-01-21 DIAGNOSIS — N186 End stage renal disease: Secondary | ICD-10-CM | POA: Diagnosis not present

## 2021-01-21 DIAGNOSIS — Z940 Kidney transplant status: Secondary | ICD-10-CM | POA: Diagnosis not present

## 2021-01-21 DIAGNOSIS — Z992 Dependence on renal dialysis: Secondary | ICD-10-CM | POA: Diagnosis not present

## 2021-01-21 DIAGNOSIS — Z79899 Other long term (current) drug therapy: Secondary | ICD-10-CM | POA: Diagnosis not present

## 2021-01-21 DIAGNOSIS — Z5181 Encounter for therapeutic drug level monitoring: Secondary | ICD-10-CM | POA: Diagnosis not present

## 2021-01-21 DIAGNOSIS — D6489 Other specified anemias: Secondary | ICD-10-CM | POA: Diagnosis not present

## 2021-01-21 DIAGNOSIS — Z4822 Encounter for aftercare following kidney transplant: Secondary | ICD-10-CM | POA: Diagnosis not present

## 2021-01-21 DIAGNOSIS — I120 Hypertensive chronic kidney disease with stage 5 chronic kidney disease or end stage renal disease: Secondary | ICD-10-CM | POA: Diagnosis not present

## 2021-01-21 DIAGNOSIS — I10 Essential (primary) hypertension: Secondary | ICD-10-CM | POA: Diagnosis not present

## 2021-01-21 DIAGNOSIS — T8619 Other complication of kidney transplant: Secondary | ICD-10-CM | POA: Diagnosis not present

## 2021-01-23 DIAGNOSIS — Z792 Long term (current) use of antibiotics: Secondary | ICD-10-CM | POA: Diagnosis not present

## 2021-01-23 DIAGNOSIS — Z7952 Long term (current) use of systemic steroids: Secondary | ICD-10-CM | POA: Diagnosis not present

## 2021-01-23 DIAGNOSIS — F32A Depression, unspecified: Secondary | ICD-10-CM | POA: Diagnosis not present

## 2021-01-23 DIAGNOSIS — D649 Anemia, unspecified: Secondary | ICD-10-CM | POA: Diagnosis not present

## 2021-01-23 DIAGNOSIS — E785 Hyperlipidemia, unspecified: Secondary | ICD-10-CM | POA: Diagnosis not present

## 2021-01-23 DIAGNOSIS — D849 Immunodeficiency, unspecified: Secondary | ICD-10-CM | POA: Diagnosis not present

## 2021-01-23 DIAGNOSIS — Z940 Kidney transplant status: Secondary | ICD-10-CM | POA: Diagnosis not present

## 2021-01-23 DIAGNOSIS — Z7982 Long term (current) use of aspirin: Secondary | ICD-10-CM | POA: Diagnosis not present

## 2021-01-23 DIAGNOSIS — Z4822 Encounter for aftercare following kidney transplant: Secondary | ICD-10-CM | POA: Diagnosis not present

## 2021-01-23 DIAGNOSIS — E876 Hypokalemia: Secondary | ICD-10-CM | POA: Diagnosis not present

## 2021-01-23 DIAGNOSIS — T8619 Other complication of kidney transplant: Secondary | ICD-10-CM | POA: Diagnosis not present

## 2021-01-23 DIAGNOSIS — I10 Essential (primary) hypertension: Secondary | ICD-10-CM | POA: Diagnosis not present

## 2021-01-23 DIAGNOSIS — Z79899 Other long term (current) drug therapy: Secondary | ICD-10-CM | POA: Diagnosis not present

## 2021-01-23 DIAGNOSIS — Z79621 Long term (current) use of calcineurin inhibitor: Secondary | ICD-10-CM | POA: Diagnosis not present

## 2021-01-25 DIAGNOSIS — Z792 Long term (current) use of antibiotics: Secondary | ICD-10-CM | POA: Diagnosis not present

## 2021-01-25 DIAGNOSIS — D849 Immunodeficiency, unspecified: Secondary | ICD-10-CM | POA: Diagnosis not present

## 2021-01-25 DIAGNOSIS — Z4822 Encounter for aftercare following kidney transplant: Secondary | ICD-10-CM | POA: Diagnosis not present

## 2021-01-25 DIAGNOSIS — E876 Hypokalemia: Secondary | ICD-10-CM | POA: Diagnosis not present

## 2021-01-25 DIAGNOSIS — T8619 Other complication of kidney transplant: Secondary | ICD-10-CM | POA: Diagnosis not present

## 2021-01-25 DIAGNOSIS — Z7952 Long term (current) use of systemic steroids: Secondary | ICD-10-CM | POA: Diagnosis not present

## 2021-01-25 DIAGNOSIS — E785 Hyperlipidemia, unspecified: Secondary | ICD-10-CM | POA: Diagnosis not present

## 2021-01-25 DIAGNOSIS — Z79621 Long term (current) use of calcineurin inhibitor: Secondary | ICD-10-CM | POA: Diagnosis not present

## 2021-01-25 DIAGNOSIS — I10 Essential (primary) hypertension: Secondary | ICD-10-CM | POA: Diagnosis not present

## 2021-01-25 DIAGNOSIS — D649 Anemia, unspecified: Secondary | ICD-10-CM | POA: Diagnosis not present

## 2021-01-25 DIAGNOSIS — Z79899 Other long term (current) drug therapy: Secondary | ICD-10-CM | POA: Diagnosis not present

## 2021-01-25 DIAGNOSIS — F32A Depression, unspecified: Secondary | ICD-10-CM | POA: Diagnosis not present

## 2021-01-25 DIAGNOSIS — Z940 Kidney transplant status: Secondary | ICD-10-CM | POA: Diagnosis not present

## 2021-01-28 DIAGNOSIS — N186 End stage renal disease: Secondary | ICD-10-CM | POA: Diagnosis not present

## 2021-01-28 DIAGNOSIS — Z79899 Other long term (current) drug therapy: Secondary | ICD-10-CM | POA: Diagnosis not present

## 2021-01-28 DIAGNOSIS — D72829 Elevated white blood cell count, unspecified: Secondary | ICD-10-CM | POA: Diagnosis not present

## 2021-01-28 DIAGNOSIS — I151 Hypertension secondary to other renal disorders: Secondary | ICD-10-CM | POA: Diagnosis not present

## 2021-01-28 DIAGNOSIS — Z5181 Encounter for therapeutic drug level monitoring: Secondary | ICD-10-CM | POA: Diagnosis not present

## 2021-01-28 DIAGNOSIS — E876 Hypokalemia: Secondary | ICD-10-CM | POA: Diagnosis not present

## 2021-01-28 DIAGNOSIS — Z7982 Long term (current) use of aspirin: Secondary | ICD-10-CM | POA: Diagnosis not present

## 2021-01-28 DIAGNOSIS — N2889 Other specified disorders of kidney and ureter: Secondary | ICD-10-CM | POA: Diagnosis not present

## 2021-01-28 DIAGNOSIS — D649 Anemia, unspecified: Secondary | ICD-10-CM | POA: Diagnosis not present

## 2021-01-28 DIAGNOSIS — Z7952 Long term (current) use of systemic steroids: Secondary | ICD-10-CM | POA: Diagnosis not present

## 2021-01-28 DIAGNOSIS — E869 Volume depletion, unspecified: Secondary | ICD-10-CM | POA: Diagnosis not present

## 2021-01-28 DIAGNOSIS — E785 Hyperlipidemia, unspecified: Secondary | ICD-10-CM | POA: Diagnosis not present

## 2021-01-28 DIAGNOSIS — Z940 Kidney transplant status: Secondary | ICD-10-CM | POA: Diagnosis not present

## 2021-01-28 DIAGNOSIS — Z4822 Encounter for aftercare following kidney transplant: Secondary | ICD-10-CM | POA: Diagnosis not present

## 2021-01-28 DIAGNOSIS — I120 Hypertensive chronic kidney disease with stage 5 chronic kidney disease or end stage renal disease: Secondary | ICD-10-CM | POA: Diagnosis not present

## 2021-01-28 DIAGNOSIS — Z79621 Long term (current) use of calcineurin inhibitor: Secondary | ICD-10-CM | POA: Diagnosis not present

## 2021-01-31 DIAGNOSIS — Z7952 Long term (current) use of systemic steroids: Secondary | ICD-10-CM | POA: Diagnosis not present

## 2021-01-31 DIAGNOSIS — F32A Depression, unspecified: Secondary | ICD-10-CM | POA: Diagnosis not present

## 2021-01-31 DIAGNOSIS — I10 Essential (primary) hypertension: Secondary | ICD-10-CM | POA: Diagnosis not present

## 2021-01-31 DIAGNOSIS — Z4822 Encounter for aftercare following kidney transplant: Secondary | ICD-10-CM | POA: Diagnosis not present

## 2021-01-31 DIAGNOSIS — Z7969 Long term (current) use of other immunomodulators and immunosuppressants: Secondary | ICD-10-CM | POA: Diagnosis not present

## 2021-01-31 DIAGNOSIS — Z940 Kidney transplant status: Secondary | ICD-10-CM | POA: Diagnosis not present

## 2021-01-31 DIAGNOSIS — G4733 Obstructive sleep apnea (adult) (pediatric): Secondary | ICD-10-CM | POA: Diagnosis not present

## 2021-01-31 DIAGNOSIS — M109 Gout, unspecified: Secondary | ICD-10-CM | POA: Diagnosis not present

## 2021-01-31 DIAGNOSIS — Z79899 Other long term (current) drug therapy: Secondary | ICD-10-CM | POA: Diagnosis not present

## 2021-01-31 DIAGNOSIS — Z79621 Long term (current) use of calcineurin inhibitor: Secondary | ICD-10-CM | POA: Diagnosis not present

## 2021-01-31 DIAGNOSIS — Z792 Long term (current) use of antibiotics: Secondary | ICD-10-CM | POA: Diagnosis not present

## 2021-01-31 DIAGNOSIS — Z8673 Personal history of transient ischemic attack (TIA), and cerebral infarction without residual deficits: Secondary | ICD-10-CM | POA: Diagnosis not present

## 2021-01-31 DIAGNOSIS — Z7982 Long term (current) use of aspirin: Secondary | ICD-10-CM | POA: Diagnosis not present

## 2021-01-31 DIAGNOSIS — E785 Hyperlipidemia, unspecified: Secondary | ICD-10-CM | POA: Diagnosis not present

## 2021-01-31 DIAGNOSIS — D649 Anemia, unspecified: Secondary | ICD-10-CM | POA: Diagnosis not present

## 2021-01-31 DIAGNOSIS — E876 Hypokalemia: Secondary | ICD-10-CM | POA: Diagnosis not present

## 2021-01-31 DIAGNOSIS — D72829 Elevated white blood cell count, unspecified: Secondary | ICD-10-CM | POA: Diagnosis not present

## 2021-02-05 DIAGNOSIS — E876 Hypokalemia: Secondary | ICD-10-CM | POA: Diagnosis not present

## 2021-02-05 DIAGNOSIS — D72829 Elevated white blood cell count, unspecified: Secondary | ICD-10-CM | POA: Diagnosis not present

## 2021-02-05 DIAGNOSIS — Z79621 Long term (current) use of calcineurin inhibitor: Secondary | ICD-10-CM | POA: Diagnosis not present

## 2021-02-05 DIAGNOSIS — Z4822 Encounter for aftercare following kidney transplant: Secondary | ICD-10-CM | POA: Diagnosis not present

## 2021-02-05 DIAGNOSIS — Z79899 Other long term (current) drug therapy: Secondary | ICD-10-CM | POA: Diagnosis not present

## 2021-02-05 DIAGNOSIS — Z940 Kidney transplant status: Secondary | ICD-10-CM | POA: Diagnosis not present

## 2021-02-05 DIAGNOSIS — E785 Hyperlipidemia, unspecified: Secondary | ICD-10-CM | POA: Diagnosis not present

## 2021-02-05 DIAGNOSIS — Z7952 Long term (current) use of systemic steroids: Secondary | ICD-10-CM | POA: Diagnosis not present

## 2021-02-05 DIAGNOSIS — R600 Localized edema: Secondary | ICD-10-CM | POA: Diagnosis not present

## 2021-02-05 DIAGNOSIS — D8989 Other specified disorders involving the immune mechanism, not elsewhere classified: Secondary | ICD-10-CM | POA: Diagnosis not present

## 2021-02-05 DIAGNOSIS — D649 Anemia, unspecified: Secondary | ICD-10-CM | POA: Diagnosis not present

## 2021-02-05 DIAGNOSIS — I10 Essential (primary) hypertension: Secondary | ICD-10-CM | POA: Diagnosis not present

## 2021-02-08 DIAGNOSIS — E785 Hyperlipidemia, unspecified: Secondary | ICD-10-CM | POA: Diagnosis not present

## 2021-02-08 DIAGNOSIS — Z4822 Encounter for aftercare following kidney transplant: Secondary | ICD-10-CM | POA: Diagnosis not present

## 2021-02-08 DIAGNOSIS — N186 End stage renal disease: Secondary | ICD-10-CM | POA: Diagnosis not present

## 2021-02-08 DIAGNOSIS — I951 Orthostatic hypotension: Secondary | ICD-10-CM | POA: Diagnosis not present

## 2021-02-08 DIAGNOSIS — F32A Depression, unspecified: Secondary | ICD-10-CM | POA: Diagnosis not present

## 2021-02-08 DIAGNOSIS — Z940 Kidney transplant status: Secondary | ICD-10-CM | POA: Diagnosis not present

## 2021-02-08 DIAGNOSIS — I10 Essential (primary) hypertension: Secondary | ICD-10-CM | POA: Diagnosis not present

## 2021-02-08 DIAGNOSIS — Z8673 Personal history of transient ischemic attack (TIA), and cerebral infarction without residual deficits: Secondary | ICD-10-CM | POA: Diagnosis not present

## 2021-02-08 DIAGNOSIS — I120 Hypertensive chronic kidney disease with stage 5 chronic kidney disease or end stage renal disease: Secondary | ICD-10-CM | POA: Diagnosis not present

## 2021-02-08 DIAGNOSIS — D649 Anemia, unspecified: Secondary | ICD-10-CM | POA: Diagnosis not present

## 2021-02-08 DIAGNOSIS — D72829 Elevated white blood cell count, unspecified: Secondary | ICD-10-CM | POA: Diagnosis not present

## 2021-02-08 DIAGNOSIS — D849 Immunodeficiency, unspecified: Secondary | ICD-10-CM | POA: Diagnosis not present

## 2021-02-08 DIAGNOSIS — Z7952 Long term (current) use of systemic steroids: Secondary | ICD-10-CM | POA: Diagnosis not present

## 2021-02-08 DIAGNOSIS — Z79899 Other long term (current) drug therapy: Secondary | ICD-10-CM | POA: Diagnosis not present

## 2021-02-12 DIAGNOSIS — R7989 Other specified abnormal findings of blood chemistry: Secondary | ICD-10-CM | POA: Diagnosis not present

## 2021-02-12 DIAGNOSIS — D849 Immunodeficiency, unspecified: Secondary | ICD-10-CM | POA: Diagnosis not present

## 2021-02-12 DIAGNOSIS — Z960 Presence of urogenital implants: Secondary | ICD-10-CM | POA: Diagnosis not present

## 2021-02-12 DIAGNOSIS — Z4822 Encounter for aftercare following kidney transplant: Secondary | ICD-10-CM | POA: Diagnosis not present

## 2021-02-12 DIAGNOSIS — E785 Hyperlipidemia, unspecified: Secondary | ICD-10-CM | POA: Diagnosis not present

## 2021-02-12 DIAGNOSIS — Z940 Kidney transplant status: Secondary | ICD-10-CM | POA: Diagnosis not present

## 2021-02-12 DIAGNOSIS — I10 Essential (primary) hypertension: Secondary | ICD-10-CM | POA: Diagnosis not present

## 2021-02-14 DIAGNOSIS — N261 Atrophy of kidney (terminal): Secondary | ICD-10-CM | POA: Diagnosis not present

## 2021-02-14 DIAGNOSIS — Z5181 Encounter for therapeutic drug level monitoring: Secondary | ICD-10-CM | POA: Diagnosis not present

## 2021-02-14 DIAGNOSIS — I701 Atherosclerosis of renal artery: Secondary | ICD-10-CM | POA: Diagnosis not present

## 2021-02-14 DIAGNOSIS — N269 Renal sclerosis, unspecified: Secondary | ICD-10-CM | POA: Diagnosis not present

## 2021-02-14 DIAGNOSIS — Z4822 Encounter for aftercare following kidney transplant: Secondary | ICD-10-CM | POA: Diagnosis not present

## 2021-02-14 DIAGNOSIS — Z940 Kidney transplant status: Secondary | ICD-10-CM | POA: Diagnosis not present

## 2021-02-14 DIAGNOSIS — Z79621 Long term (current) use of calcineurin inhibitor: Secondary | ICD-10-CM | POA: Diagnosis not present

## 2021-02-18 DIAGNOSIS — F32A Depression, unspecified: Secondary | ICD-10-CM | POA: Diagnosis not present

## 2021-02-18 DIAGNOSIS — I120 Hypertensive chronic kidney disease with stage 5 chronic kidney disease or end stage renal disease: Secondary | ICD-10-CM | POA: Diagnosis not present

## 2021-02-18 DIAGNOSIS — Z7952 Long term (current) use of systemic steroids: Secondary | ICD-10-CM | POA: Diagnosis not present

## 2021-02-18 DIAGNOSIS — D8989 Other specified disorders involving the immune mechanism, not elsewhere classified: Secondary | ICD-10-CM | POA: Diagnosis not present

## 2021-02-18 DIAGNOSIS — E785 Hyperlipidemia, unspecified: Secondary | ICD-10-CM | POA: Diagnosis not present

## 2021-02-18 DIAGNOSIS — I10 Essential (primary) hypertension: Secondary | ICD-10-CM | POA: Diagnosis not present

## 2021-02-18 DIAGNOSIS — Z940 Kidney transplant status: Secondary | ICD-10-CM | POA: Diagnosis not present

## 2021-02-18 DIAGNOSIS — N186 End stage renal disease: Secondary | ICD-10-CM | POA: Diagnosis not present

## 2021-02-18 DIAGNOSIS — Z4822 Encounter for aftercare following kidney transplant: Secondary | ICD-10-CM | POA: Diagnosis not present

## 2021-02-18 DIAGNOSIS — Z79899 Other long term (current) drug therapy: Secondary | ICD-10-CM | POA: Diagnosis not present

## 2021-02-25 DIAGNOSIS — Z4822 Encounter for aftercare following kidney transplant: Secondary | ICD-10-CM | POA: Diagnosis not present

## 2021-02-25 DIAGNOSIS — F32A Depression, unspecified: Secondary | ICD-10-CM | POA: Diagnosis not present

## 2021-02-25 DIAGNOSIS — Z7969 Long term (current) use of other immunomodulators and immunosuppressants: Secondary | ICD-10-CM | POA: Diagnosis not present

## 2021-02-25 DIAGNOSIS — Z5181 Encounter for therapeutic drug level monitoring: Secondary | ICD-10-CM | POA: Diagnosis not present

## 2021-02-25 DIAGNOSIS — Z79621 Long term (current) use of calcineurin inhibitor: Secondary | ICD-10-CM | POA: Diagnosis not present

## 2021-02-25 DIAGNOSIS — D849 Immunodeficiency, unspecified: Secondary | ICD-10-CM | POA: Diagnosis not present

## 2021-02-25 DIAGNOSIS — D649 Anemia, unspecified: Secondary | ICD-10-CM | POA: Diagnosis not present

## 2021-02-25 DIAGNOSIS — E785 Hyperlipidemia, unspecified: Secondary | ICD-10-CM | POA: Diagnosis not present

## 2021-02-25 DIAGNOSIS — M109 Gout, unspecified: Secondary | ICD-10-CM | POA: Diagnosis not present

## 2021-02-25 DIAGNOSIS — Z940 Kidney transplant status: Secondary | ICD-10-CM | POA: Diagnosis not present

## 2021-02-25 DIAGNOSIS — Z792 Long term (current) use of antibiotics: Secondary | ICD-10-CM | POA: Diagnosis not present

## 2021-02-25 DIAGNOSIS — Z7952 Long term (current) use of systemic steroids: Secondary | ICD-10-CM | POA: Diagnosis not present

## 2021-02-25 DIAGNOSIS — I10 Essential (primary) hypertension: Secondary | ICD-10-CM | POA: Diagnosis not present

## 2021-02-25 DIAGNOSIS — Z79899 Other long term (current) drug therapy: Secondary | ICD-10-CM | POA: Diagnosis not present

## 2021-02-25 DIAGNOSIS — G4733 Obstructive sleep apnea (adult) (pediatric): Secondary | ICD-10-CM | POA: Diagnosis not present

## 2021-03-04 DIAGNOSIS — D849 Immunodeficiency, unspecified: Secondary | ICD-10-CM | POA: Diagnosis not present

## 2021-03-04 DIAGNOSIS — Z5181 Encounter for therapeutic drug level monitoring: Secondary | ICD-10-CM | POA: Diagnosis not present

## 2021-03-04 DIAGNOSIS — Z79899 Other long term (current) drug therapy: Secondary | ICD-10-CM | POA: Diagnosis not present

## 2021-03-04 DIAGNOSIS — F32A Depression, unspecified: Secondary | ICD-10-CM | POA: Diagnosis not present

## 2021-03-04 DIAGNOSIS — E785 Hyperlipidemia, unspecified: Secondary | ICD-10-CM | POA: Diagnosis not present

## 2021-03-04 DIAGNOSIS — I10 Essential (primary) hypertension: Secondary | ICD-10-CM | POA: Diagnosis not present

## 2021-03-04 DIAGNOSIS — Z79621 Long term (current) use of calcineurin inhibitor: Secondary | ICD-10-CM | POA: Diagnosis not present

## 2021-03-04 DIAGNOSIS — Z940 Kidney transplant status: Secondary | ICD-10-CM | POA: Diagnosis not present

## 2021-03-11 DIAGNOSIS — Z79621 Long term (current) use of calcineurin inhibitor: Secondary | ICD-10-CM | POA: Diagnosis not present

## 2021-03-11 DIAGNOSIS — Z79899 Other long term (current) drug therapy: Secondary | ICD-10-CM | POA: Diagnosis not present

## 2021-03-11 DIAGNOSIS — Z940 Kidney transplant status: Secondary | ICD-10-CM | POA: Diagnosis not present

## 2021-03-11 DIAGNOSIS — I10 Essential (primary) hypertension: Secondary | ICD-10-CM | POA: Diagnosis not present

## 2021-03-11 DIAGNOSIS — D849 Immunodeficiency, unspecified: Secondary | ICD-10-CM | POA: Diagnosis not present

## 2021-03-11 DIAGNOSIS — Z4822 Encounter for aftercare following kidney transplant: Secondary | ICD-10-CM | POA: Diagnosis not present

## 2021-03-11 DIAGNOSIS — E876 Hypokalemia: Secondary | ICD-10-CM | POA: Diagnosis not present

## 2021-03-11 DIAGNOSIS — D649 Anemia, unspecified: Secondary | ICD-10-CM | POA: Diagnosis not present

## 2021-03-11 DIAGNOSIS — E785 Hyperlipidemia, unspecified: Secondary | ICD-10-CM | POA: Diagnosis not present

## 2021-03-11 DIAGNOSIS — Z7952 Long term (current) use of systemic steroids: Secondary | ICD-10-CM | POA: Diagnosis not present

## 2021-03-11 DIAGNOSIS — Z5181 Encounter for therapeutic drug level monitoring: Secondary | ICD-10-CM | POA: Diagnosis not present

## 2021-03-20 DIAGNOSIS — E876 Hypokalemia: Secondary | ICD-10-CM | POA: Diagnosis not present

## 2021-03-20 DIAGNOSIS — D649 Anemia, unspecified: Secondary | ICD-10-CM | POA: Diagnosis not present

## 2021-03-20 DIAGNOSIS — G4733 Obstructive sleep apnea (adult) (pediatric): Secondary | ICD-10-CM | POA: Diagnosis not present

## 2021-03-20 DIAGNOSIS — Z79899 Other long term (current) drug therapy: Secondary | ICD-10-CM | POA: Diagnosis not present

## 2021-03-20 DIAGNOSIS — D849 Immunodeficiency, unspecified: Secondary | ICD-10-CM | POA: Diagnosis not present

## 2021-03-20 DIAGNOSIS — Z4822 Encounter for aftercare following kidney transplant: Secondary | ICD-10-CM | POA: Diagnosis not present

## 2021-03-20 DIAGNOSIS — J302 Other seasonal allergic rhinitis: Secondary | ICD-10-CM | POA: Diagnosis not present

## 2021-03-20 DIAGNOSIS — I10 Essential (primary) hypertension: Secondary | ICD-10-CM | POA: Diagnosis not present

## 2021-03-20 DIAGNOSIS — Z940 Kidney transplant status: Secondary | ICD-10-CM | POA: Diagnosis not present

## 2021-03-20 DIAGNOSIS — Z79621 Long term (current) use of calcineurin inhibitor: Secondary | ICD-10-CM | POA: Diagnosis not present

## 2021-03-20 DIAGNOSIS — Z792 Long term (current) use of antibiotics: Secondary | ICD-10-CM | POA: Diagnosis not present

## 2021-03-20 DIAGNOSIS — R251 Tremor, unspecified: Secondary | ICD-10-CM | POA: Diagnosis not present

## 2021-03-20 DIAGNOSIS — Z7952 Long term (current) use of systemic steroids: Secondary | ICD-10-CM | POA: Diagnosis not present

## 2021-03-20 DIAGNOSIS — M109 Gout, unspecified: Secondary | ICD-10-CM | POA: Diagnosis not present

## 2021-04-02 DIAGNOSIS — Z940 Kidney transplant status: Secondary | ICD-10-CM | POA: Diagnosis not present

## 2021-04-10 DIAGNOSIS — E785 Hyperlipidemia, unspecified: Secondary | ICD-10-CM | POA: Diagnosis not present

## 2021-04-10 DIAGNOSIS — I10 Essential (primary) hypertension: Secondary | ICD-10-CM | POA: Diagnosis not present

## 2021-04-10 DIAGNOSIS — Z792 Long term (current) use of antibiotics: Secondary | ICD-10-CM | POA: Diagnosis not present

## 2021-04-10 DIAGNOSIS — Z7952 Long term (current) use of systemic steroids: Secondary | ICD-10-CM | POA: Diagnosis not present

## 2021-04-10 DIAGNOSIS — Z4822 Encounter for aftercare following kidney transplant: Secondary | ICD-10-CM | POA: Diagnosis not present

## 2021-04-10 DIAGNOSIS — E876 Hypokalemia: Secondary | ICD-10-CM | POA: Diagnosis not present

## 2021-04-10 DIAGNOSIS — D649 Anemia, unspecified: Secondary | ICD-10-CM | POA: Diagnosis not present

## 2021-04-10 DIAGNOSIS — F32A Depression, unspecified: Secondary | ICD-10-CM | POA: Diagnosis not present

## 2021-04-10 DIAGNOSIS — Z79621 Long term (current) use of calcineurin inhibitor: Secondary | ICD-10-CM | POA: Diagnosis not present

## 2021-04-10 DIAGNOSIS — Z79899 Other long term (current) drug therapy: Secondary | ICD-10-CM | POA: Diagnosis not present

## 2021-04-10 DIAGNOSIS — J302 Other seasonal allergic rhinitis: Secondary | ICD-10-CM | POA: Diagnosis not present

## 2021-04-10 DIAGNOSIS — Z940 Kidney transplant status: Secondary | ICD-10-CM | POA: Diagnosis not present

## 2021-04-10 DIAGNOSIS — D849 Immunodeficiency, unspecified: Secondary | ICD-10-CM | POA: Diagnosis not present

## 2021-05-01 DIAGNOSIS — I7090 Unspecified atherosclerosis: Secondary | ICD-10-CM | POA: Diagnosis not present

## 2021-05-01 DIAGNOSIS — E876 Hypokalemia: Secondary | ICD-10-CM | POA: Diagnosis not present

## 2021-05-01 DIAGNOSIS — R251 Tremor, unspecified: Secondary | ICD-10-CM | POA: Diagnosis not present

## 2021-05-01 DIAGNOSIS — Z79621 Long term (current) use of calcineurin inhibitor: Secondary | ICD-10-CM | POA: Diagnosis not present

## 2021-05-01 DIAGNOSIS — N269 Renal sclerosis, unspecified: Secondary | ICD-10-CM | POA: Diagnosis not present

## 2021-05-01 DIAGNOSIS — Z4822 Encounter for aftercare following kidney transplant: Secondary | ICD-10-CM | POA: Diagnosis not present

## 2021-05-01 DIAGNOSIS — T451X5A Adverse effect of antineoplastic and immunosuppressive drugs, initial encounter: Secondary | ICD-10-CM | POA: Diagnosis not present

## 2021-05-01 DIAGNOSIS — D649 Anemia, unspecified: Secondary | ICD-10-CM | POA: Diagnosis not present

## 2021-05-01 DIAGNOSIS — I10 Essential (primary) hypertension: Secondary | ICD-10-CM | POA: Diagnosis not present

## 2021-05-01 DIAGNOSIS — Z7952 Long term (current) use of systemic steroids: Secondary | ICD-10-CM | POA: Diagnosis not present

## 2021-05-01 DIAGNOSIS — J302 Other seasonal allergic rhinitis: Secondary | ICD-10-CM | POA: Diagnosis not present

## 2021-05-01 DIAGNOSIS — Z940 Kidney transplant status: Secondary | ICD-10-CM | POA: Diagnosis not present

## 2021-05-01 DIAGNOSIS — E785 Hyperlipidemia, unspecified: Secondary | ICD-10-CM | POA: Diagnosis not present

## 2021-05-01 DIAGNOSIS — Z79899 Other long term (current) drug therapy: Secondary | ICD-10-CM | POA: Diagnosis not present

## 2021-05-01 DIAGNOSIS — Z882 Allergy status to sulfonamides status: Secondary | ICD-10-CM | POA: Diagnosis not present

## 2021-05-01 DIAGNOSIS — Z5181 Encounter for therapeutic drug level monitoring: Secondary | ICD-10-CM | POA: Diagnosis not present

## 2021-05-01 DIAGNOSIS — D849 Immunodeficiency, unspecified: Secondary | ICD-10-CM | POA: Diagnosis not present

## 2021-05-15 DIAGNOSIS — Z4822 Encounter for aftercare following kidney transplant: Secondary | ICD-10-CM | POA: Diagnosis not present

## 2021-05-29 DIAGNOSIS — Z940 Kidney transplant status: Secondary | ICD-10-CM | POA: Diagnosis not present

## 2021-05-29 DIAGNOSIS — D849 Immunodeficiency, unspecified: Secondary | ICD-10-CM | POA: Diagnosis not present

## 2021-05-29 DIAGNOSIS — J302 Other seasonal allergic rhinitis: Secondary | ICD-10-CM | POA: Diagnosis not present

## 2021-05-29 DIAGNOSIS — Z79621 Long term (current) use of calcineurin inhibitor: Secondary | ICD-10-CM | POA: Diagnosis not present

## 2021-05-29 DIAGNOSIS — I10 Essential (primary) hypertension: Secondary | ICD-10-CM | POA: Diagnosis not present

## 2021-05-29 DIAGNOSIS — D649 Anemia, unspecified: Secondary | ICD-10-CM | POA: Diagnosis not present

## 2021-05-29 DIAGNOSIS — E785 Hyperlipidemia, unspecified: Secondary | ICD-10-CM | POA: Diagnosis not present

## 2021-05-29 DIAGNOSIS — E876 Hypokalemia: Secondary | ICD-10-CM | POA: Diagnosis not present

## 2021-05-29 DIAGNOSIS — Z4822 Encounter for aftercare following kidney transplant: Secondary | ICD-10-CM | POA: Diagnosis not present

## 2021-05-29 DIAGNOSIS — Z79899 Other long term (current) drug therapy: Secondary | ICD-10-CM | POA: Diagnosis not present

## 2021-05-29 DIAGNOSIS — Z7969 Long term (current) use of other immunomodulators and immunosuppressants: Secondary | ICD-10-CM | POA: Diagnosis not present

## 2021-05-30 DIAGNOSIS — I10 Essential (primary) hypertension: Secondary | ICD-10-CM | POA: Diagnosis not present

## 2021-05-30 DIAGNOSIS — M130 Polyarthritis, unspecified: Secondary | ICD-10-CM | POA: Diagnosis not present

## 2021-05-30 DIAGNOSIS — Z940 Kidney transplant status: Secondary | ICD-10-CM | POA: Diagnosis not present

## 2021-05-30 DIAGNOSIS — I739 Peripheral vascular disease, unspecified: Secondary | ICD-10-CM | POA: Diagnosis not present

## 2021-06-03 DIAGNOSIS — Z4822 Encounter for aftercare following kidney transplant: Secondary | ICD-10-CM | POA: Diagnosis not present

## 2021-06-18 DIAGNOSIS — Z4822 Encounter for aftercare following kidney transplant: Secondary | ICD-10-CM | POA: Diagnosis not present

## 2021-06-26 DIAGNOSIS — I10 Essential (primary) hypertension: Secondary | ICD-10-CM | POA: Diagnosis not present

## 2021-06-26 DIAGNOSIS — F32A Depression, unspecified: Secondary | ICD-10-CM | POA: Diagnosis not present

## 2021-06-26 DIAGNOSIS — E785 Hyperlipidemia, unspecified: Secondary | ICD-10-CM | POA: Diagnosis not present

## 2021-06-26 DIAGNOSIS — E876 Hypokalemia: Secondary | ICD-10-CM | POA: Diagnosis not present

## 2021-06-26 DIAGNOSIS — M109 Gout, unspecified: Secondary | ICD-10-CM | POA: Diagnosis not present

## 2021-06-26 DIAGNOSIS — Z7952 Long term (current) use of systemic steroids: Secondary | ICD-10-CM | POA: Diagnosis not present

## 2021-06-26 DIAGNOSIS — G4733 Obstructive sleep apnea (adult) (pediatric): Secondary | ICD-10-CM | POA: Diagnosis not present

## 2021-06-26 DIAGNOSIS — D849 Immunodeficiency, unspecified: Secondary | ICD-10-CM | POA: Diagnosis not present

## 2021-06-26 DIAGNOSIS — R251 Tremor, unspecified: Secondary | ICD-10-CM | POA: Diagnosis not present

## 2021-06-26 DIAGNOSIS — Z940 Kidney transplant status: Secondary | ICD-10-CM | POA: Diagnosis not present

## 2021-06-26 DIAGNOSIS — Z79624 Long term (current) use of inhibitors of nucleotide synthesis: Secondary | ICD-10-CM | POA: Diagnosis not present

## 2021-06-26 DIAGNOSIS — D649 Anemia, unspecified: Secondary | ICD-10-CM | POA: Diagnosis not present

## 2021-06-26 DIAGNOSIS — Z882 Allergy status to sulfonamides status: Secondary | ICD-10-CM | POA: Diagnosis not present

## 2021-06-26 DIAGNOSIS — B259 Cytomegaloviral disease, unspecified: Secondary | ICD-10-CM | POA: Diagnosis not present

## 2021-06-26 DIAGNOSIS — Z79621 Long term (current) use of calcineurin inhibitor: Secondary | ICD-10-CM | POA: Diagnosis not present

## 2021-06-26 DIAGNOSIS — Z4822 Encounter for aftercare following kidney transplant: Secondary | ICD-10-CM | POA: Diagnosis not present

## 2021-06-26 DIAGNOSIS — Z79899 Other long term (current) drug therapy: Secondary | ICD-10-CM | POA: Diagnosis not present

## 2021-06-26 DIAGNOSIS — J302 Other seasonal allergic rhinitis: Secondary | ICD-10-CM | POA: Diagnosis not present

## 2021-07-08 DIAGNOSIS — I10 Essential (primary) hypertension: Secondary | ICD-10-CM | POA: Diagnosis not present

## 2021-07-08 DIAGNOSIS — E118 Type 2 diabetes mellitus with unspecified complications: Secondary | ICD-10-CM | POA: Diagnosis not present

## 2021-07-08 DIAGNOSIS — E1169 Type 2 diabetes mellitus with other specified complication: Secondary | ICD-10-CM | POA: Diagnosis not present

## 2021-07-08 DIAGNOSIS — Z940 Kidney transplant status: Secondary | ICD-10-CM | POA: Diagnosis not present

## 2021-07-08 DIAGNOSIS — E785 Hyperlipidemia, unspecified: Secondary | ICD-10-CM | POA: Diagnosis not present

## 2021-07-08 DIAGNOSIS — M130 Polyarthritis, unspecified: Secondary | ICD-10-CM | POA: Diagnosis not present

## 2021-07-17 DIAGNOSIS — Z4822 Encounter for aftercare following kidney transplant: Secondary | ICD-10-CM | POA: Diagnosis not present

## 2021-07-22 DIAGNOSIS — R7309 Other abnormal glucose: Secondary | ICD-10-CM | POA: Diagnosis not present

## 2021-07-22 DIAGNOSIS — E785 Hyperlipidemia, unspecified: Secondary | ICD-10-CM | POA: Diagnosis not present

## 2021-07-22 DIAGNOSIS — I10 Essential (primary) hypertension: Secondary | ICD-10-CM | POA: Diagnosis not present

## 2021-07-22 DIAGNOSIS — Z940 Kidney transplant status: Secondary | ICD-10-CM | POA: Diagnosis not present

## 2021-07-31 DIAGNOSIS — Z5181 Encounter for therapeutic drug level monitoring: Secondary | ICD-10-CM | POA: Diagnosis not present

## 2021-07-31 DIAGNOSIS — D849 Immunodeficiency, unspecified: Secondary | ICD-10-CM | POA: Diagnosis not present

## 2021-07-31 DIAGNOSIS — Z940 Kidney transplant status: Secondary | ICD-10-CM | POA: Diagnosis not present

## 2021-07-31 DIAGNOSIS — I10 Essential (primary) hypertension: Secondary | ICD-10-CM | POA: Diagnosis not present

## 2021-07-31 DIAGNOSIS — Z4822 Encounter for aftercare following kidney transplant: Secondary | ICD-10-CM | POA: Diagnosis not present

## 2021-07-31 DIAGNOSIS — B259 Cytomegaloviral disease, unspecified: Secondary | ICD-10-CM | POA: Diagnosis not present

## 2021-07-31 DIAGNOSIS — Z79621 Long term (current) use of calcineurin inhibitor: Secondary | ICD-10-CM | POA: Diagnosis not present

## 2021-08-21 ENCOUNTER — Ambulatory Visit: Payer: Self-pay

## 2021-08-21 NOTE — Patient Outreach (Signed)
  Care Coordination   08/21/2021 Name: ULONDA KLOSOWSKI MRN: [id number] DOB: 14-Oct-1955   Care Coordination Outreach Attempts:  An unsuccessful telephone outreach was attempted today to offer the patient information about available care coordination services as a benefit of their health plan.   Follow Up Plan:  Additional outreach attempts will be made to offer the patient care coordination information and services.   Encounter Outcome:  No Answer  Care Coordination Interventions Activated:  No   Care Coordination Interventions:  No, not indicated    Daneen Schick, BSW, CDP Social Worker, Certified Dementia Practitioner Care Coordination [phone]

## 2021-09-11 DIAGNOSIS — Z4822 Encounter for aftercare following kidney transplant: Secondary | ICD-10-CM | POA: Diagnosis not present

## 2021-09-11 DIAGNOSIS — Z888 Allergy status to other drugs, medicaments and biological substances status: Secondary | ICD-10-CM | POA: Diagnosis not present

## 2021-09-11 DIAGNOSIS — Z882 Allergy status to sulfonamides status: Secondary | ICD-10-CM | POA: Diagnosis not present

## 2021-09-11 DIAGNOSIS — K219 Gastro-esophageal reflux disease without esophagitis: Secondary | ICD-10-CM | POA: Diagnosis not present

## 2021-09-11 DIAGNOSIS — Z79899 Other long term (current) drug therapy: Secondary | ICD-10-CM | POA: Diagnosis not present

## 2021-09-11 DIAGNOSIS — I10 Essential (primary) hypertension: Secondary | ICD-10-CM | POA: Diagnosis not present

## 2021-09-11 DIAGNOSIS — Z79621 Long term (current) use of calcineurin inhibitor: Secondary | ICD-10-CM | POA: Diagnosis not present

## 2021-09-16 DIAGNOSIS — I10 Essential (primary) hypertension: Secondary | ICD-10-CM | POA: Diagnosis not present

## 2021-09-16 DIAGNOSIS — T8610 Unspecified complication of kidney transplant: Secondary | ICD-10-CM | POA: Diagnosis not present

## 2021-09-16 DIAGNOSIS — M130 Polyarthritis, unspecified: Secondary | ICD-10-CM | POA: Diagnosis not present

## 2021-10-09 DIAGNOSIS — Z79621 Long term (current) use of calcineurin inhibitor: Secondary | ICD-10-CM | POA: Diagnosis not present

## 2021-10-09 DIAGNOSIS — R609 Edema, unspecified: Secondary | ICD-10-CM | POA: Diagnosis not present

## 2021-10-09 DIAGNOSIS — R7989 Other specified abnormal findings of blood chemistry: Secondary | ICD-10-CM | POA: Diagnosis not present

## 2021-10-09 DIAGNOSIS — Z882 Allergy status to sulfonamides status: Secondary | ICD-10-CM | POA: Diagnosis not present

## 2021-10-09 DIAGNOSIS — J302 Other seasonal allergic rhinitis: Secondary | ICD-10-CM | POA: Diagnosis not present

## 2021-10-09 DIAGNOSIS — Z940 Kidney transplant status: Secondary | ICD-10-CM | POA: Diagnosis not present

## 2021-10-09 DIAGNOSIS — E785 Hyperlipidemia, unspecified: Secondary | ICD-10-CM | POA: Diagnosis not present

## 2021-10-09 DIAGNOSIS — Z4822 Encounter for aftercare following kidney transplant: Secondary | ICD-10-CM | POA: Diagnosis not present

## 2021-10-09 DIAGNOSIS — Z79899 Other long term (current) drug therapy: Secondary | ICD-10-CM | POA: Diagnosis not present

## 2021-10-09 DIAGNOSIS — Z7952 Long term (current) use of systemic steroids: Secondary | ICD-10-CM | POA: Diagnosis not present

## 2021-10-09 DIAGNOSIS — E876 Hypokalemia: Secondary | ICD-10-CM | POA: Diagnosis not present

## 2021-10-09 DIAGNOSIS — D849 Immunodeficiency, unspecified: Secondary | ICD-10-CM | POA: Diagnosis not present

## 2021-10-09 DIAGNOSIS — Z79624 Long term (current) use of inhibitors of nucleotide synthesis: Secondary | ICD-10-CM | POA: Diagnosis not present

## 2021-10-09 DIAGNOSIS — Z5181 Encounter for therapeutic drug level monitoring: Secondary | ICD-10-CM | POA: Diagnosis not present

## 2021-10-09 DIAGNOSIS — I10 Essential (primary) hypertension: Secondary | ICD-10-CM | POA: Diagnosis not present

## 2021-10-28 DIAGNOSIS — Z6832 Body mass index (BMI) 32.0-32.9, adult: Secondary | ICD-10-CM | POA: Diagnosis not present

## 2021-10-28 DIAGNOSIS — E785 Hyperlipidemia, unspecified: Secondary | ICD-10-CM | POA: Diagnosis not present

## 2021-10-28 DIAGNOSIS — N1832 Chronic kidney disease, stage 3b: Secondary | ICD-10-CM | POA: Diagnosis not present

## 2021-10-28 DIAGNOSIS — Z23 Encounter for immunization: Secondary | ICD-10-CM | POA: Diagnosis not present

## 2021-10-28 DIAGNOSIS — E1169 Type 2 diabetes mellitus with other specified complication: Secondary | ICD-10-CM | POA: Diagnosis not present

## 2021-10-28 DIAGNOSIS — I10 Essential (primary) hypertension: Secondary | ICD-10-CM | POA: Diagnosis not present

## 2021-10-29 DIAGNOSIS — Z1231 Encounter for screening mammogram for malignant neoplasm of breast: Secondary | ICD-10-CM | POA: Diagnosis not present

## 2021-11-12 DIAGNOSIS — Z780 Asymptomatic menopausal state: Secondary | ICD-10-CM | POA: Diagnosis not present

## 2021-11-12 DIAGNOSIS — M8588 Other specified disorders of bone density and structure, other site: Secondary | ICD-10-CM | POA: Diagnosis not present

## 2021-11-12 DIAGNOSIS — M810 Age-related osteoporosis without current pathological fracture: Secondary | ICD-10-CM | POA: Diagnosis not present

## 2021-11-19 DIAGNOSIS — F32A Depression, unspecified: Secondary | ICD-10-CM | POA: Diagnosis not present

## 2021-11-19 DIAGNOSIS — E785 Hyperlipidemia, unspecified: Secondary | ICD-10-CM | POA: Diagnosis not present

## 2021-11-19 DIAGNOSIS — I10 Essential (primary) hypertension: Secondary | ICD-10-CM | POA: Diagnosis not present

## 2021-11-19 DIAGNOSIS — Z79621 Long term (current) use of calcineurin inhibitor: Secondary | ICD-10-CM | POA: Diagnosis not present

## 2021-11-19 DIAGNOSIS — E876 Hypokalemia: Secondary | ICD-10-CM | POA: Diagnosis not present

## 2021-11-19 DIAGNOSIS — Z4822 Encounter for aftercare following kidney transplant: Secondary | ICD-10-CM | POA: Diagnosis not present

## 2021-11-19 DIAGNOSIS — Z79899 Other long term (current) drug therapy: Secondary | ICD-10-CM | POA: Diagnosis not present

## 2021-11-19 DIAGNOSIS — J302 Other seasonal allergic rhinitis: Secondary | ICD-10-CM | POA: Diagnosis not present

## 2021-11-19 DIAGNOSIS — Z940 Kidney transplant status: Secondary | ICD-10-CM | POA: Diagnosis not present

## 2021-11-19 DIAGNOSIS — D649 Anemia, unspecified: Secondary | ICD-10-CM | POA: Diagnosis not present

## 2021-11-19 DIAGNOSIS — Z5181 Encounter for therapeutic drug level monitoring: Secondary | ICD-10-CM | POA: Diagnosis not present

## 2021-11-19 DIAGNOSIS — R600 Localized edema: Secondary | ICD-10-CM | POA: Diagnosis not present

## 2021-11-19 DIAGNOSIS — D849 Immunodeficiency, unspecified: Secondary | ICD-10-CM | POA: Diagnosis not present

## 2021-11-19 DIAGNOSIS — Z79624 Long term (current) use of inhibitors of nucleotide synthesis: Secondary | ICD-10-CM | POA: Diagnosis not present

## 2021-12-25 DIAGNOSIS — D649 Anemia, unspecified: Secondary | ICD-10-CM | POA: Diagnosis not present

## 2021-12-25 DIAGNOSIS — Z940 Kidney transplant status: Secondary | ICD-10-CM | POA: Diagnosis not present

## 2021-12-25 DIAGNOSIS — Z5181 Encounter for therapeutic drug level monitoring: Secondary | ICD-10-CM | POA: Diagnosis not present

## 2021-12-25 DIAGNOSIS — Z792 Long term (current) use of antibiotics: Secondary | ICD-10-CM | POA: Diagnosis not present

## 2021-12-25 DIAGNOSIS — E785 Hyperlipidemia, unspecified: Secondary | ICD-10-CM | POA: Diagnosis not present

## 2021-12-25 DIAGNOSIS — D849 Immunodeficiency, unspecified: Secondary | ICD-10-CM | POA: Diagnosis not present

## 2021-12-25 DIAGNOSIS — E876 Hypokalemia: Secondary | ICD-10-CM | POA: Diagnosis not present

## 2021-12-25 DIAGNOSIS — Z7952 Long term (current) use of systemic steroids: Secondary | ICD-10-CM | POA: Diagnosis not present

## 2021-12-25 DIAGNOSIS — J302 Other seasonal allergic rhinitis: Secondary | ICD-10-CM | POA: Diagnosis not present

## 2021-12-25 DIAGNOSIS — Z79621 Long term (current) use of calcineurin inhibitor: Secondary | ICD-10-CM | POA: Diagnosis not present

## 2021-12-25 DIAGNOSIS — Z4822 Encounter for aftercare following kidney transplant: Secondary | ICD-10-CM | POA: Diagnosis not present

## 2021-12-25 DIAGNOSIS — Z79624 Long term (current) use of inhibitors of nucleotide synthesis: Secondary | ICD-10-CM | POA: Diagnosis not present

## 2021-12-25 DIAGNOSIS — F32A Depression, unspecified: Secondary | ICD-10-CM | POA: Diagnosis not present

## 2021-12-25 DIAGNOSIS — I10 Essential (primary) hypertension: Secondary | ICD-10-CM | POA: Diagnosis not present

## 2021-12-25 DIAGNOSIS — Z79899 Other long term (current) drug therapy: Secondary | ICD-10-CM | POA: Diagnosis not present

## 2022-01-03 DIAGNOSIS — Z4822 Encounter for aftercare following kidney transplant: Secondary | ICD-10-CM | POA: Diagnosis not present

## 2022-01-15 DIAGNOSIS — Z79899 Other long term (current) drug therapy: Secondary | ICD-10-CM | POA: Diagnosis not present

## 2022-01-15 DIAGNOSIS — Z4822 Encounter for aftercare following kidney transplant: Secondary | ICD-10-CM | POA: Diagnosis not present

## 2022-01-15 DIAGNOSIS — F32A Depression, unspecified: Secondary | ICD-10-CM | POA: Diagnosis not present

## 2022-01-15 DIAGNOSIS — E876 Hypokalemia: Secondary | ICD-10-CM | POA: Diagnosis not present

## 2022-01-15 DIAGNOSIS — I10 Essential (primary) hypertension: Secondary | ICD-10-CM | POA: Diagnosis not present

## 2022-01-15 DIAGNOSIS — Z940 Kidney transplant status: Secondary | ICD-10-CM | POA: Diagnosis not present

## 2022-01-15 DIAGNOSIS — D849 Immunodeficiency, unspecified: Secondary | ICD-10-CM | POA: Diagnosis not present

## 2022-01-15 DIAGNOSIS — Z79624 Long term (current) use of inhibitors of nucleotide synthesis: Secondary | ICD-10-CM | POA: Diagnosis not present

## 2022-01-15 DIAGNOSIS — E785 Hyperlipidemia, unspecified: Secondary | ICD-10-CM | POA: Diagnosis not present

## 2022-01-15 DIAGNOSIS — Z7952 Long term (current) use of systemic steroids: Secondary | ICD-10-CM | POA: Diagnosis not present

## 2022-01-15 DIAGNOSIS — Z5181 Encounter for therapeutic drug level monitoring: Secondary | ICD-10-CM | POA: Diagnosis not present

## 2022-01-15 DIAGNOSIS — D649 Anemia, unspecified: Secondary | ICD-10-CM | POA: Diagnosis not present

## 2022-01-15 DIAGNOSIS — Z79621 Long term (current) use of calcineurin inhibitor: Secondary | ICD-10-CM | POA: Diagnosis not present

## 2022-03-05 DIAGNOSIS — Z79899 Other long term (current) drug therapy: Secondary | ICD-10-CM | POA: Diagnosis not present

## 2022-03-05 DIAGNOSIS — R0602 Shortness of breath: Secondary | ICD-10-CM | POA: Diagnosis not present

## 2022-03-05 DIAGNOSIS — I10 Essential (primary) hypertension: Secondary | ICD-10-CM | POA: Diagnosis not present

## 2022-03-05 DIAGNOSIS — Z79624 Long term (current) use of inhibitors of nucleotide synthesis: Secondary | ICD-10-CM | POA: Diagnosis not present

## 2022-03-05 DIAGNOSIS — Z79621 Long term (current) use of calcineurin inhibitor: Secondary | ICD-10-CM | POA: Diagnosis not present

## 2022-03-05 DIAGNOSIS — Z7952 Long term (current) use of systemic steroids: Secondary | ICD-10-CM | POA: Diagnosis not present

## 2022-03-05 DIAGNOSIS — K219 Gastro-esophageal reflux disease without esophagitis: Secondary | ICD-10-CM | POA: Diagnosis not present

## 2022-03-05 DIAGNOSIS — Z4822 Encounter for aftercare following kidney transplant: Secondary | ICD-10-CM | POA: Diagnosis not present

## 2022-03-25 DIAGNOSIS — Z940 Kidney transplant status: Secondary | ICD-10-CM | POA: Diagnosis not present

## 2022-03-25 DIAGNOSIS — J301 Allergic rhinitis due to pollen: Secondary | ICD-10-CM | POA: Diagnosis not present

## 2022-03-25 DIAGNOSIS — I10 Essential (primary) hypertension: Secondary | ICD-10-CM | POA: Diagnosis not present

## 2022-03-25 DIAGNOSIS — E785 Hyperlipidemia, unspecified: Secondary | ICD-10-CM | POA: Diagnosis not present

## 2022-03-25 DIAGNOSIS — E88819 Insulin resistance, unspecified: Secondary | ICD-10-CM | POA: Diagnosis not present

## 2022-03-25 DIAGNOSIS — E7800 Pure hypercholesterolemia, unspecified: Secondary | ICD-10-CM | POA: Diagnosis not present

## 2022-03-25 DIAGNOSIS — R7303 Prediabetes: Secondary | ICD-10-CM | POA: Diagnosis not present

## 2022-05-07 DIAGNOSIS — I10 Essential (primary) hypertension: Secondary | ICD-10-CM | POA: Diagnosis not present

## 2022-05-07 DIAGNOSIS — D849 Immunodeficiency, unspecified: Secondary | ICD-10-CM | POA: Diagnosis not present

## 2022-05-07 DIAGNOSIS — Z940 Kidney transplant status: Secondary | ICD-10-CM | POA: Diagnosis not present

## 2022-05-16 DIAGNOSIS — I770 Arteriovenous fistula, acquired: Secondary | ICD-10-CM | POA: Diagnosis not present

## 2022-05-16 DIAGNOSIS — Z940 Kidney transplant status: Secondary | ICD-10-CM | POA: Diagnosis not present

## 2022-05-16 DIAGNOSIS — D849 Immunodeficiency, unspecified: Secondary | ICD-10-CM | POA: Diagnosis not present

## 2022-05-16 DIAGNOSIS — I129 Hypertensive chronic kidney disease with stage 1 through stage 4 chronic kidney disease, or unspecified chronic kidney disease: Secondary | ICD-10-CM | POA: Diagnosis not present

## 2022-05-16 DIAGNOSIS — E1122 Type 2 diabetes mellitus with diabetic chronic kidney disease: Secondary | ICD-10-CM | POA: Diagnosis not present

## 2022-05-19 DIAGNOSIS — Z940 Kidney transplant status: Secondary | ICD-10-CM | POA: Diagnosis not present

## 2022-06-18 DIAGNOSIS — E1129 Type 2 diabetes mellitus with other diabetic kidney complication: Secondary | ICD-10-CM | POA: Diagnosis not present

## 2022-06-18 DIAGNOSIS — Z940 Kidney transplant status: Secondary | ICD-10-CM | POA: Diagnosis not present

## 2022-07-01 DIAGNOSIS — Z940 Kidney transplant status: Secondary | ICD-10-CM | POA: Diagnosis not present

## 2022-07-01 DIAGNOSIS — E1122 Type 2 diabetes mellitus with diabetic chronic kidney disease: Secondary | ICD-10-CM | POA: Diagnosis not present

## 2022-07-01 DIAGNOSIS — D849 Immunodeficiency, unspecified: Secondary | ICD-10-CM | POA: Diagnosis not present

## 2022-07-01 DIAGNOSIS — I129 Hypertensive chronic kidney disease with stage 1 through stage 4 chronic kidney disease, or unspecified chronic kidney disease: Secondary | ICD-10-CM | POA: Diagnosis not present

## 2022-07-01 DIAGNOSIS — E785 Hyperlipidemia, unspecified: Secondary | ICD-10-CM | POA: Diagnosis not present

## 2022-07-18 DIAGNOSIS — Z940 Kidney transplant status: Secondary | ICD-10-CM | POA: Diagnosis not present

## 2022-08-01 DIAGNOSIS — I129 Hypertensive chronic kidney disease with stage 1 through stage 4 chronic kidney disease, or unspecified chronic kidney disease: Secondary | ICD-10-CM | POA: Diagnosis not present

## 2022-08-01 DIAGNOSIS — J0100 Acute maxillary sinusitis, unspecified: Secondary | ICD-10-CM | POA: Diagnosis not present

## 2022-08-01 DIAGNOSIS — D849 Immunodeficiency, unspecified: Secondary | ICD-10-CM | POA: Diagnosis not present

## 2022-08-01 DIAGNOSIS — Z940 Kidney transplant status: Secondary | ICD-10-CM | POA: Diagnosis not present
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
## 2022-08-05 DIAGNOSIS — Z940 Kidney transplant status: Secondary | ICD-10-CM | POA: Diagnosis not present

## 2022-08-05 DIAGNOSIS — N189 Chronic kidney disease, unspecified: Secondary | ICD-10-CM | POA: Diagnosis not present

## 2022-08-12 DIAGNOSIS — Z940 Kidney transplant status: Secondary | ICD-10-CM | POA: Diagnosis not present

## 2022-08-14 DIAGNOSIS — I10 Essential (primary) hypertension: Secondary | ICD-10-CM | POA: Diagnosis not present

## 2022-08-14 DIAGNOSIS — D849 Immunodeficiency, unspecified: Secondary | ICD-10-CM | POA: Diagnosis not present

## 2022-08-14 DIAGNOSIS — R197 Diarrhea, unspecified: Secondary | ICD-10-CM | POA: Diagnosis not present

## 2022-08-14 DIAGNOSIS — R7989 Other specified abnormal findings of blood chemistry: Secondary | ICD-10-CM | POA: Diagnosis not present

## 2022-08-14 DIAGNOSIS — Z79899 Other long term (current) drug therapy: Secondary | ICD-10-CM | POA: Diagnosis not present

## 2022-08-14 DIAGNOSIS — Z940 Kidney transplant status: Secondary | ICD-10-CM | POA: Diagnosis not present

## 2022-08-19 DIAGNOSIS — Z940 Kidney transplant status: Secondary | ICD-10-CM | POA: Diagnosis not present

## 2022-08-19 DIAGNOSIS — M1A9XX0 Chronic gout, unspecified, without tophus (tophi): Secondary | ICD-10-CM | POA: Diagnosis not present

## 2022-08-19 DIAGNOSIS — Z882 Allergy status to sulfonamides status: Secondary | ICD-10-CM | POA: Diagnosis not present

## 2022-08-19 DIAGNOSIS — N179 Acute kidney failure, unspecified: Secondary | ICD-10-CM | POA: Diagnosis not present

## 2022-08-19 DIAGNOSIS — N139 Obstructive and reflux uropathy, unspecified: Secondary | ICD-10-CM | POA: Diagnosis not present

## 2022-08-19 DIAGNOSIS — Z79899 Other long term (current) drug therapy: Secondary | ICD-10-CM | POA: Diagnosis not present

## 2022-08-19 DIAGNOSIS — G4733 Obstructive sleep apnea (adult) (pediatric): Secondary | ICD-10-CM | POA: Diagnosis not present

## 2022-08-19 DIAGNOSIS — N132 Hydronephrosis with renal and ureteral calculous obstruction: Secondary | ICD-10-CM | POA: Diagnosis not present

## 2022-08-19 DIAGNOSIS — I2510 Atherosclerotic heart disease of native coronary artery without angina pectoris: Secondary | ICD-10-CM | POA: Diagnosis not present

## 2022-08-27 DIAGNOSIS — D849 Immunodeficiency, unspecified: Secondary | ICD-10-CM | POA: Diagnosis not present

## 2022-08-27 DIAGNOSIS — Z940 Kidney transplant status: Secondary | ICD-10-CM | POA: Diagnosis not present

## 2022-08-27 DIAGNOSIS — Z79899 Other long term (current) drug therapy: Secondary | ICD-10-CM | POA: Diagnosis not present

## 2022-08-27 DIAGNOSIS — R7989 Other specified abnormal findings of blood chemistry: Secondary | ICD-10-CM | POA: Diagnosis not present

## 2022-08-27 DIAGNOSIS — T8619 Other complication of kidney transplant: Secondary | ICD-10-CM | POA: Diagnosis not present

## 2022-08-27 DIAGNOSIS — N201 Calculus of ureter: Secondary | ICD-10-CM | POA: Diagnosis not present

## 2022-08-27 DIAGNOSIS — I10 Essential (primary) hypertension: Secondary | ICD-10-CM | POA: Diagnosis not present

## 2022-09-04 DIAGNOSIS — Z940 Kidney transplant status: Secondary | ICD-10-CM | POA: Diagnosis not present

## 2022-09-10 DIAGNOSIS — Z940 Kidney transplant status: Secondary | ICD-10-CM | POA: Diagnosis not present

## 2022-09-10 DIAGNOSIS — Z792 Long term (current) use of antibiotics: Secondary | ICD-10-CM | POA: Diagnosis not present

## 2022-09-10 DIAGNOSIS — Z936 Other artificial openings of urinary tract status: Secondary | ICD-10-CM | POA: Diagnosis not present

## 2022-09-10 DIAGNOSIS — Z79899 Other long term (current) drug therapy: Secondary | ICD-10-CM | POA: Diagnosis not present

## 2022-09-10 DIAGNOSIS — Z5181 Encounter for therapeutic drug level monitoring: Secondary | ICD-10-CM | POA: Diagnosis not present

## 2022-09-10 DIAGNOSIS — B9561 Methicillin susceptible Staphylococcus aureus infection as the cause of diseases classified elsewhere: Secondary | ICD-10-CM | POA: Diagnosis not present

## 2022-09-10 DIAGNOSIS — T83022A Displacement of nephrostomy catheter, initial encounter: Secondary | ICD-10-CM | POA: Diagnosis not present

## 2022-09-10 DIAGNOSIS — R7881 Bacteremia: Secondary | ICD-10-CM | POA: Diagnosis not present

## 2022-09-10 DIAGNOSIS — N202 Calculus of kidney with calculus of ureter: Secondary | ICD-10-CM | POA: Diagnosis not present

## 2022-09-10 DIAGNOSIS — N136 Pyonephrosis: Secondary | ICD-10-CM | POA: Diagnosis not present

## 2022-09-10 DIAGNOSIS — Z79621 Long term (current) use of calcineurin inhibitor: Secondary | ICD-10-CM | POA: Diagnosis not present

## 2022-09-10 DIAGNOSIS — M1A9XX0 Chronic gout, unspecified, without tophus (tophi): Secondary | ICD-10-CM | POA: Diagnosis not present

## 2022-09-10 DIAGNOSIS — I2510 Atherosclerotic heart disease of native coronary artery without angina pectoris: Secondary | ICD-10-CM | POA: Diagnosis not present

## 2022-09-10 DIAGNOSIS — Z7960 Long term (current) use of unspecified immunomodulators and immunosuppressants: Secondary | ICD-10-CM | POA: Diagnosis not present

## 2022-09-10 DIAGNOSIS — T8619 Other complication of kidney transplant: Secondary | ICD-10-CM | POA: Diagnosis not present

## 2022-09-10 DIAGNOSIS — N179 Acute kidney failure, unspecified: Secondary | ICD-10-CM | POA: Diagnosis not present

## 2022-09-10 DIAGNOSIS — Z4822 Encounter for aftercare following kidney transplant: Secondary | ICD-10-CM | POA: Diagnosis not present

## 2022-09-10 DIAGNOSIS — Z7952 Long term (current) use of systemic steroids: Secondary | ICD-10-CM | POA: Diagnosis not present

## 2022-09-10 DIAGNOSIS — B9689 Other specified bacterial agents as the cause of diseases classified elsewhere: Secondary | ICD-10-CM | POA: Diagnosis not present

## 2022-09-10 DIAGNOSIS — D84821 Immunodeficiency due to drugs: Secondary | ICD-10-CM | POA: Diagnosis not present

## 2022-09-10 DIAGNOSIS — N200 Calculus of kidney: Secondary | ICD-10-CM | POA: Diagnosis not present

## 2022-09-10 DIAGNOSIS — D849 Immunodeficiency, unspecified: Secondary | ICD-10-CM | POA: Diagnosis not present

## 2022-09-10 DIAGNOSIS — I10 Essential (primary) hypertension: Secondary | ICD-10-CM | POA: Diagnosis not present

## 2022-09-10 DIAGNOSIS — Z87442 Personal history of urinary calculi: Secondary | ICD-10-CM | POA: Diagnosis not present

## 2022-09-11 DIAGNOSIS — N202 Calculus of kidney with calculus of ureter: Secondary | ICD-10-CM | POA: Diagnosis not present

## 2022-09-11 DIAGNOSIS — Z7952 Long term (current) use of systemic steroids: Secondary | ICD-10-CM | POA: Diagnosis not present

## 2022-09-11 DIAGNOSIS — I2510 Atherosclerotic heart disease of native coronary artery without angina pectoris: Secondary | ICD-10-CM | POA: Diagnosis not present

## 2022-09-11 DIAGNOSIS — N179 Acute kidney failure, unspecified: Secondary | ICD-10-CM | POA: Diagnosis not present

## 2022-09-11 DIAGNOSIS — Z5181 Encounter for therapeutic drug level monitoring: Secondary | ICD-10-CM | POA: Diagnosis not present

## 2022-09-11 DIAGNOSIS — T8619 Other complication of kidney transplant: Secondary | ICD-10-CM | POA: Diagnosis not present

## 2022-09-11 DIAGNOSIS — T83022A Displacement of nephrostomy catheter, initial encounter: Secondary | ICD-10-CM | POA: Diagnosis not present

## 2022-09-11 DIAGNOSIS — D84821 Immunodeficiency due to drugs: Secondary | ICD-10-CM | POA: Diagnosis not present

## 2022-09-11 DIAGNOSIS — Z940 Kidney transplant status: Secondary | ICD-10-CM | POA: Diagnosis not present

## 2022-09-11 DIAGNOSIS — Z4822 Encounter for aftercare following kidney transplant: Secondary | ICD-10-CM | POA: Diagnosis not present

## 2022-09-11 DIAGNOSIS — Z792 Long term (current) use of antibiotics: Secondary | ICD-10-CM | POA: Diagnosis not present

## 2022-09-11 DIAGNOSIS — Z87442 Personal history of urinary calculi: Secondary | ICD-10-CM | POA: Diagnosis not present

## 2022-09-11 DIAGNOSIS — Z79899 Other long term (current) drug therapy: Secondary | ICD-10-CM | POA: Diagnosis not present

## 2022-09-11 DIAGNOSIS — B9689 Other specified bacterial agents as the cause of diseases classified elsewhere: Secondary | ICD-10-CM | POA: Diagnosis not present

## 2022-09-11 DIAGNOSIS — R7881 Bacteremia: Secondary | ICD-10-CM | POA: Diagnosis not present

## 2022-09-11 DIAGNOSIS — M1A9XX0 Chronic gout, unspecified, without tophus (tophi): Secondary | ICD-10-CM | POA: Diagnosis not present

## 2022-09-11 DIAGNOSIS — I10 Essential (primary) hypertension: Secondary | ICD-10-CM | POA: Diagnosis not present

## 2022-09-11 DIAGNOSIS — N1330 Unspecified hydronephrosis: Secondary | ICD-10-CM | POA: Diagnosis not present

## 2022-09-11 DIAGNOSIS — N132 Hydronephrosis with renal and ureteral calculous obstruction: Secondary | ICD-10-CM | POA: Diagnosis not present

## 2022-09-11 DIAGNOSIS — B9561 Methicillin susceptible Staphylococcus aureus infection as the cause of diseases classified elsewhere: Secondary | ICD-10-CM | POA: Diagnosis not present

## 2022-09-11 DIAGNOSIS — N136 Pyonephrosis: Secondary | ICD-10-CM | POA: Diagnosis not present

## 2022-09-11 DIAGNOSIS — Z7960 Long term (current) use of unspecified immunomodulators and immunosuppressants: Secondary | ICD-10-CM | POA: Diagnosis not present

## 2022-09-12 DIAGNOSIS — R079 Chest pain, unspecified: Secondary | ICD-10-CM | POA: Diagnosis not present

## 2022-09-12 DIAGNOSIS — Z940 Kidney transplant status: Secondary | ICD-10-CM | POA: Diagnosis not present

## 2022-09-12 DIAGNOSIS — M8588 Other specified disorders of bone density and structure, other site: Secondary | ICD-10-CM | POA: Diagnosis not present

## 2022-09-12 DIAGNOSIS — Z888 Allergy status to other drugs, medicaments and biological substances status: Secondary | ICD-10-CM | POA: Diagnosis not present

## 2022-09-12 DIAGNOSIS — T83022A Displacement of nephrostomy catheter, initial encounter: Secondary | ICD-10-CM | POA: Diagnosis not present

## 2022-09-12 DIAGNOSIS — E860 Dehydration: Secondary | ICD-10-CM | POA: Diagnosis not present

## 2022-09-12 DIAGNOSIS — N202 Calculus of kidney with calculus of ureter: Secondary | ICD-10-CM | POA: Diagnosis not present

## 2022-09-12 DIAGNOSIS — Z7952 Long term (current) use of systemic steroids: Secondary | ICD-10-CM | POA: Diagnosis not present

## 2022-09-12 DIAGNOSIS — N139 Obstructive and reflux uropathy, unspecified: Secondary | ICD-10-CM | POA: Diagnosis not present

## 2022-09-12 DIAGNOSIS — M25559 Pain in unspecified hip: Secondary | ICD-10-CM | POA: Diagnosis not present

## 2022-09-12 DIAGNOSIS — I340 Nonrheumatic mitral (valve) insufficiency: Secondary | ICD-10-CM | POA: Diagnosis not present

## 2022-09-12 DIAGNOSIS — Z466 Encounter for fitting and adjustment of urinary device: Secondary | ICD-10-CM | POA: Diagnosis not present

## 2022-09-12 DIAGNOSIS — N135 Crossing vessel and stricture of ureter without hydronephrosis: Secondary | ICD-10-CM | POA: Diagnosis not present

## 2022-09-12 DIAGNOSIS — Z789 Other specified health status: Secondary | ICD-10-CM | POA: Diagnosis not present

## 2022-09-12 DIAGNOSIS — B9689 Other specified bacterial agents as the cause of diseases classified elsewhere: Secondary | ICD-10-CM | POA: Diagnosis not present

## 2022-09-12 DIAGNOSIS — Z7951 Long term (current) use of inhaled steroids: Secondary | ICD-10-CM | POA: Diagnosis not present

## 2022-09-12 DIAGNOSIS — I2510 Atherosclerotic heart disease of native coronary artery without angina pectoris: Secondary | ICD-10-CM | POA: Diagnosis not present

## 2022-09-12 DIAGNOSIS — N179 Acute kidney failure, unspecified: Secondary | ICD-10-CM | POA: Diagnosis not present

## 2022-09-12 DIAGNOSIS — B9561 Methicillin susceptible Staphylococcus aureus infection as the cause of diseases classified elsewhere: Secondary | ICD-10-CM | POA: Diagnosis not present

## 2022-09-12 DIAGNOSIS — D84821 Immunodeficiency due to drugs: Secondary | ICD-10-CM | POA: Diagnosis not present

## 2022-09-12 DIAGNOSIS — Z79899 Other long term (current) drug therapy: Secondary | ICD-10-CM | POA: Diagnosis not present

## 2022-09-12 DIAGNOSIS — N200 Calculus of kidney: Secondary | ICD-10-CM | POA: Diagnosis not present

## 2022-09-12 DIAGNOSIS — Z1629 Resistance to other single specified antibiotic: Secondary | ICD-10-CM | POA: Diagnosis not present

## 2022-09-12 DIAGNOSIS — I10 Essential (primary) hypertension: Secondary | ICD-10-CM | POA: Diagnosis not present

## 2022-09-12 DIAGNOSIS — Z792 Long term (current) use of antibiotics: Secondary | ICD-10-CM | POA: Diagnosis not present

## 2022-09-12 DIAGNOSIS — D72829 Elevated white blood cell count, unspecified: Secondary | ICD-10-CM | POA: Diagnosis not present

## 2022-09-12 DIAGNOSIS — Z5181 Encounter for therapeutic drug level monitoring: Secondary | ICD-10-CM | POA: Diagnosis not present

## 2022-09-12 DIAGNOSIS — M1A9XX0 Chronic gout, unspecified, without tophus (tophi): Secondary | ICD-10-CM | POA: Diagnosis not present

## 2022-09-12 DIAGNOSIS — S0990XA Unspecified injury of head, initial encounter: Secondary | ICD-10-CM | POA: Diagnosis not present

## 2022-09-12 DIAGNOSIS — N390 Urinary tract infection, site not specified: Secondary | ICD-10-CM | POA: Diagnosis not present

## 2022-09-12 DIAGNOSIS — Z79621 Long term (current) use of calcineurin inhibitor: Secondary | ICD-10-CM | POA: Diagnosis not present

## 2022-09-12 DIAGNOSIS — I77819 Aortic ectasia, unspecified site: Secondary | ICD-10-CM | POA: Diagnosis not present

## 2022-09-12 DIAGNOSIS — M5031 Other cervical disc degeneration,  high cervical region: Secondary | ICD-10-CM | POA: Diagnosis not present

## 2022-09-12 DIAGNOSIS — M47812 Spondylosis without myelopathy or radiculopathy, cervical region: Secondary | ICD-10-CM | POA: Diagnosis not present

## 2022-09-12 DIAGNOSIS — B9562 Methicillin resistant Staphylococcus aureus infection as the cause of diseases classified elsewhere: Secondary | ICD-10-CM | POA: Diagnosis not present

## 2022-09-12 DIAGNOSIS — Z936 Other artificial openings of urinary tract status: Secondary | ICD-10-CM | POA: Diagnosis not present

## 2022-09-12 DIAGNOSIS — N132 Hydronephrosis with renal and ureteral calculous obstruction: Secondary | ICD-10-CM | POA: Diagnosis not present

## 2022-09-12 DIAGNOSIS — A419 Sepsis, unspecified organism: Secondary | ICD-10-CM | POA: Diagnosis not present

## 2022-09-12 DIAGNOSIS — Z7960 Long term (current) use of unspecified immunomodulators and immunosuppressants: Secondary | ICD-10-CM | POA: Diagnosis not present

## 2022-09-12 DIAGNOSIS — N3001 Acute cystitis with hematuria: Secondary | ICD-10-CM | POA: Diagnosis not present

## 2022-09-12 DIAGNOSIS — R531 Weakness: Secondary | ICD-10-CM | POA: Diagnosis not present

## 2022-09-12 DIAGNOSIS — N201 Calculus of ureter: Secondary | ICD-10-CM | POA: Diagnosis not present

## 2022-09-12 DIAGNOSIS — R7881 Bacteremia: Secondary | ICD-10-CM | POA: Diagnosis not present

## 2022-09-12 DIAGNOSIS — S37009A Unspecified injury of unspecified kidney, initial encounter: Secondary | ICD-10-CM | POA: Diagnosis not present

## 2022-09-12 DIAGNOSIS — R0902 Hypoxemia: Secondary | ICD-10-CM | POA: Diagnosis not present

## 2022-09-12 DIAGNOSIS — M4802 Spinal stenosis, cervical region: Secondary | ICD-10-CM | POA: Diagnosis not present

## 2022-09-12 DIAGNOSIS — Z87442 Personal history of urinary calculi: Secondary | ICD-10-CM | POA: Diagnosis not present

## 2022-09-12 DIAGNOSIS — Z882 Allergy status to sulfonamides status: Secondary | ICD-10-CM | POA: Diagnosis not present

## 2022-09-12 DIAGNOSIS — K5730 Diverticulosis of large intestine without perforation or abscess without bleeding: Secondary | ICD-10-CM | POA: Diagnosis not present

## 2022-09-12 DIAGNOSIS — N136 Pyonephrosis: Secondary | ICD-10-CM | POA: Diagnosis not present

## 2022-09-12 DIAGNOSIS — Z7969 Long term (current) use of other immunomodulators and immunosuppressants: Secondary | ICD-10-CM | POA: Diagnosis not present

## 2022-09-12 DIAGNOSIS — I771 Stricture of artery: Secondary | ICD-10-CM | POA: Diagnosis not present

## 2022-09-12 DIAGNOSIS — T8619 Other complication of kidney transplant: Secondary | ICD-10-CM | POA: Diagnosis not present

## 2022-09-12 DIAGNOSIS — Z4822 Encounter for aftercare following kidney transplant: Secondary | ICD-10-CM | POA: Diagnosis not present

## 2022-09-14 ENCOUNTER — Inpatient Hospital Stay (HOSPITAL_COMMUNITY)
Admission: EM | Admit: 2022-09-14 | Discharge: 2022-09-15 | DRG: 699 | Disposition: A | Discharge status: Other Acute Inpatient Hospital | Payer: Medicare Other | Attending: Internal Medicine | Admitting: Internal Medicine

## 2022-09-14 ENCOUNTER — Other Ambulatory Visit: Payer: Self-pay

## 2022-09-14 ENCOUNTER — Emergency Department (HOSPITAL_COMMUNITY): Payer: Medicare Other

## 2022-09-14 ENCOUNTER — Encounter (HOSPITAL_COMMUNITY): Payer: Self-pay | Admitting: Internal Medicine

## 2022-09-14 DIAGNOSIS — M5031 Other cervical disc degeneration,  high cervical region: Secondary | ICD-10-CM | POA: Diagnosis not present

## 2022-09-14 DIAGNOSIS — Z882 Allergy status to sulfonamides status: Secondary | ICD-10-CM

## 2022-09-14 DIAGNOSIS — Z79899 Other long term (current) drug therapy: Secondary | ICD-10-CM | POA: Diagnosis not present

## 2022-09-14 DIAGNOSIS — N200 Calculus of kidney: Secondary | ICD-10-CM | POA: Diagnosis not present

## 2022-09-14 DIAGNOSIS — R4182 Altered mental status, unspecified: Secondary | ICD-10-CM

## 2022-09-14 DIAGNOSIS — N201 Calculus of ureter: Secondary | ICD-10-CM | POA: Diagnosis not present

## 2022-09-14 DIAGNOSIS — M47812 Spondylosis without myelopathy or radiculopathy, cervical region: Secondary | ICD-10-CM | POA: Diagnosis not present

## 2022-09-14 DIAGNOSIS — M8588 Other specified disorders of bone density and structure, other site: Secondary | ICD-10-CM | POA: Diagnosis not present

## 2022-09-14 DIAGNOSIS — E860 Dehydration: Secondary | ICD-10-CM | POA: Diagnosis present

## 2022-09-14 DIAGNOSIS — B9561 Methicillin susceptible Staphylococcus aureus infection as the cause of diseases classified elsewhere: Secondary | ICD-10-CM | POA: Diagnosis present

## 2022-09-14 DIAGNOSIS — N3001 Acute cystitis with hematuria: Secondary | ICD-10-CM | POA: Diagnosis not present

## 2022-09-14 DIAGNOSIS — K5730 Diverticulosis of large intestine without perforation or abscess without bleeding: Secondary | ICD-10-CM | POA: Diagnosis not present

## 2022-09-14 DIAGNOSIS — S37009A Unspecified injury of unspecified kidney, initial encounter: Secondary | ICD-10-CM | POA: Diagnosis not present

## 2022-09-14 DIAGNOSIS — Y732 Prosthetic and other implants, materials and accessory gastroenterology and urology devices associated with adverse incidents: Secondary | ICD-10-CM | POA: Diagnosis present

## 2022-09-14 DIAGNOSIS — R079 Chest pain, unspecified: Secondary | ICD-10-CM | POA: Diagnosis not present

## 2022-09-14 DIAGNOSIS — M4802 Spinal stenosis, cervical region: Secondary | ICD-10-CM | POA: Diagnosis not present

## 2022-09-14 DIAGNOSIS — T83022A Displacement of nephrostomy catheter, initial encounter: Secondary | ICD-10-CM | POA: Diagnosis not present

## 2022-09-14 DIAGNOSIS — W19XXXA Unspecified fall, initial encounter: Secondary | ICD-10-CM

## 2022-09-14 DIAGNOSIS — Z1629 Resistance to other single specified antibiotic: Secondary | ICD-10-CM | POA: Diagnosis not present

## 2022-09-14 DIAGNOSIS — Z7951 Long term (current) use of inhaled steroids: Secondary | ICD-10-CM

## 2022-09-14 DIAGNOSIS — N179 Acute kidney failure, unspecified: Secondary | ICD-10-CM | POA: Diagnosis not present

## 2022-09-14 DIAGNOSIS — R531 Weakness: Secondary | ICD-10-CM | POA: Diagnosis not present

## 2022-09-14 DIAGNOSIS — T8619 Other complication of kidney transplant: Secondary | ICD-10-CM | POA: Diagnosis not present

## 2022-09-14 DIAGNOSIS — I771 Stricture of artery: Secondary | ICD-10-CM | POA: Diagnosis not present

## 2022-09-14 DIAGNOSIS — Z940 Kidney transplant status: Secondary | ICD-10-CM

## 2022-09-14 DIAGNOSIS — Z888 Allergy status to other drugs, medicaments and biological substances status: Secondary | ICD-10-CM

## 2022-09-14 DIAGNOSIS — N390 Urinary tract infection, site not specified: Secondary | ICD-10-CM | POA: Diagnosis present

## 2022-09-14 DIAGNOSIS — M25559 Pain in unspecified hip: Secondary | ICD-10-CM | POA: Diagnosis not present

## 2022-09-14 DIAGNOSIS — I10 Essential (primary) hypertension: Secondary | ICD-10-CM | POA: Diagnosis not present

## 2022-09-14 DIAGNOSIS — Z7969 Long term (current) use of other immunomodulators and immunosuppressants: Secondary | ICD-10-CM | POA: Diagnosis not present

## 2022-09-14 DIAGNOSIS — R0902 Hypoxemia: Secondary | ICD-10-CM | POA: Diagnosis not present

## 2022-09-14 DIAGNOSIS — Z466 Encounter for fitting and adjustment of urinary device: Secondary | ICD-10-CM | POA: Diagnosis not present

## 2022-09-14 DIAGNOSIS — N132 Hydronephrosis with renal and ureteral calculous obstruction: Secondary | ICD-10-CM | POA: Diagnosis not present

## 2022-09-14 DIAGNOSIS — I77819 Aortic ectasia, unspecified site: Secondary | ICD-10-CM | POA: Diagnosis not present

## 2022-09-14 DIAGNOSIS — S0990XA Unspecified injury of head, initial encounter: Secondary | ICD-10-CM | POA: Diagnosis not present

## 2022-09-14 LAB — COMPREHENSIVE METABOLIC PANEL
ALT: 54 U/L — ABNORMAL HIGH (ref 0–44)
AST: 24 U/L (ref 15–41)
Albumin: 2.3 g/dL — ABNORMAL LOW (ref 3.5–5.0)
Alkaline Phosphatase: 140 U/L — ABNORMAL HIGH (ref 38–126)
Anion gap: 14 (ref 5–15)
BUN: 55 mg/dL — ABNORMAL HIGH (ref 8–23)
CO2: 20 mmol/L — ABNORMAL LOW (ref 22–32)
Calcium: 9.6 mg/dL (ref 8.9–10.3)
Chloride: 102 mmol/L (ref 98–111)
Creatinine, Ser: 3.18 mg/dL — ABNORMAL HIGH (ref 0.44–1.00)
GFR, Estimated: 16 mL/min — ABNORMAL LOW (ref >60)
Glucose, Bld: 163 mg/dL — ABNORMAL HIGH (ref 70–99)
Potassium: 3.1 mmol/L — ABNORMAL LOW (ref 3.5–5.1)
Sodium: 136 mmol/L (ref 135–145)
Total Bilirubin: 2.1 mg/dL — ABNORMAL HIGH (ref 0.3–1.2)
Total Protein: 6.5 g/dL (ref 6.5–8.1)

## 2022-09-14 LAB — URINALYSIS, W/ REFLEX TO CULTURE (INFECTION SUSPECTED)
Bilirubin Urine: NEGATIVE
Glucose, UA: NEGATIVE mg/dL
Ketones, ur: NEGATIVE mg/dL
Nitrite: NEGATIVE
Protein, ur: 100 mg/dL — AB
RBC / HPF: >50 RBC/hpf (ref 0–5)
Specific Gravity, Urine: 1.011 (ref 1.005–1.030)
WBC, UA: >50 WBC/hpf (ref 0–5)
pH: 6 (ref 5.0–8.0)

## 2022-09-14 LAB — CBC WITH DIFFERENTIAL/PLATELET
Abs Immature Granulocytes: 0.49 10*3/uL — ABNORMAL HIGH (ref 0.00–0.07)
Basophils Absolute: 0 10*3/uL (ref 0.0–0.1)
Basophils Relative: 0 %
Eosinophils Absolute: 0 10*3/uL (ref 0.0–0.5)
Eosinophils Relative: 0 %
HCT: 28.4 % — ABNORMAL LOW (ref 36.0–46.0)
Hemoglobin: 9.1 g/dL — ABNORMAL LOW (ref 12.0–15.0)
Immature Granulocytes: 3 %
Lymphocytes Relative: 2 %
Lymphs Abs: 0.4 10*3/uL — ABNORMAL LOW (ref 0.7–4.0)
MCH: 26.6 pg (ref 26.0–34.0)
MCHC: 32 g/dL (ref 30.0–36.0)
MCV: 83 fL (ref 80.0–100.0)
Monocytes Absolute: 0.6 10*3/uL (ref 0.1–1.0)
Monocytes Relative: 3 %
Neutro Abs: 15.4 10*3/uL — ABNORMAL HIGH (ref 1.7–7.7)
Neutrophils Relative %: 92 %
Platelets: 331 10*3/uL (ref 150–400)
RBC: 3.42 MIL/uL — ABNORMAL LOW (ref 3.87–5.11)
RDW: 14.6 % (ref 11.5–15.5)
WBC: 16.9 10*3/uL — ABNORMAL HIGH (ref 4.0–10.5)
nRBC: 0 % (ref 0.0–0.2)

## 2022-09-14 LAB — CK: Total CK: 43 U/L (ref 38–234)

## 2022-09-14 MED ORDER — SODIUM CHLORIDE 0.9 % IV BOLUS
500.0000 mL | Freq: Once | INTRAVENOUS | Status: AC
  Administered 2022-09-14: 500 mL via INTRAVENOUS

## 2022-09-14 MED ORDER — ACETAMINOPHEN 650 MG RE SUPP: 650.0000 mg | Freq: Four times a day (QID) | RECTAL | Status: DC | PRN

## 2022-09-14 MED ORDER — ACETAMINOPHEN 325 MG PO TABS: 650.0000 mg | ORAL_TABLET | Freq: Four times a day (QID) | ORAL | Status: DC | PRN

## 2022-09-14 MED ORDER — ONDANSETRON HCL 4 MG/2ML IJ SOLN: 4.0000 mg | Freq: Four times a day (QID) | INTRAMUSCULAR | Status: DC | PRN

## 2022-09-14 MED ORDER — LACTATED RINGERS IV BOLUS
500.0000 mL | Freq: Once | INTRAVENOUS | Status: AC
  Administered 2022-09-14: 500 mL via INTRAVENOUS

## 2022-09-14 MED ORDER — SODIUM CHLORIDE 0.9 % IV SOLN: 1.0000 g | INTRAVENOUS | Status: DC

## 2022-09-14 MED ORDER — MELATONIN 3 MG PO TABS: 3.0000 mg | ORAL_TABLET | Freq: Every evening | ORAL | Status: DC | PRN

## 2022-09-14 MED ORDER — SODIUM CHLORIDE 0.9 % IV SOLN
1.0000 g | Freq: Once | INTRAVENOUS | Status: AC
  Administered 2022-09-14: 1 g via INTRAVENOUS
  Filled 2022-09-14: qty 10

## 2022-09-14 NOTE — ED Notes (Signed)
Patient going to [facility] in stead.
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
## 2022-09-14 NOTE — Progress Notes (Signed)
[facility] Progress Note:  This patient has been accepted for admission by the renal transplant service at [facility]. Therefor, she will be admitted from [facility] ED to [facility] as opposed to being admitted by the hospitalist service at [facility].      Newton Pigg, DO Hospitalist
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
## 2022-09-14 NOTE — ED Notes (Signed)
Chelsea Roberts  [phone]

## 2022-09-15 NOTE — ED Notes (Signed)
Report called to Irving Shows at [facility].

## 2022-09-15 NOTE — ED Notes (Signed)
Room [phone] [facility]  [phone]  [phone]

## 2022-09-15 NOTE — ED Notes (Signed)
Partially did EMTALA. Waiting transport to arrive to finish. Patient is sleeping. No distress. Aware of the plan.

## 2022-09-16 DIAGNOSIS — N390 Urinary tract infection, site not specified: Secondary | ICD-10-CM | POA: Diagnosis not present

## 2022-09-16 DIAGNOSIS — Z940 Kidney transplant status: Secondary | ICD-10-CM | POA: Diagnosis not present

## 2022-09-16 DIAGNOSIS — N135 Crossing vessel and stricture of ureter without hydronephrosis: Secondary | ICD-10-CM | POA: Diagnosis not present

## 2022-09-16 DIAGNOSIS — B9562 Methicillin resistant Staphylococcus aureus infection as the cause of diseases classified elsewhere: Secondary | ICD-10-CM | POA: Diagnosis not present

## 2022-09-16 DIAGNOSIS — N132 Hydronephrosis with renal and ureteral calculous obstruction: Secondary | ICD-10-CM | POA: Diagnosis not present

## 2022-09-16 DIAGNOSIS — N139 Obstructive and reflux uropathy, unspecified: Secondary | ICD-10-CM | POA: Diagnosis not present

## 2022-09-16 DIAGNOSIS — Z936 Other artificial openings of urinary tract status: Secondary | ICD-10-CM | POA: Diagnosis not present

## 2022-09-16 DIAGNOSIS — T83022A Displacement of nephrostomy catheter, initial encounter: Secondary | ICD-10-CM | POA: Diagnosis not present

## 2022-09-16 DIAGNOSIS — R7881 Bacteremia: Secondary | ICD-10-CM | POA: Diagnosis not present

## 2022-09-16 LAB — URINE CULTURE: Culture: >=100000 — AB

## 2022-09-16 LAB — TACROLIMUS LEVEL: Tacrolimus (FK506) - LabCorp: 2.3 ng/mL (ref 2.0–20.0)

## 2022-09-17 ENCOUNTER — Telehealth (HOSPITAL_BASED_OUTPATIENT_CLINIC_OR_DEPARTMENT_OTHER): Payer: Self-pay | Admitting: *Deleted

## 2022-09-17 DIAGNOSIS — B9561 Methicillin susceptible Staphylococcus aureus infection as the cause of diseases classified elsewhere: Secondary | ICD-10-CM | POA: Diagnosis not present

## 2022-09-17 DIAGNOSIS — R7881 Bacteremia: Secondary | ICD-10-CM | POA: Diagnosis not present

## 2022-09-17 DIAGNOSIS — N390 Urinary tract infection, site not specified: Secondary | ICD-10-CM | POA: Diagnosis not present

## 2022-09-17 NOTE — Telephone Encounter (Signed)
Post ED Visit - Positive Culture Follow-up  Culture report reviewed by antimicrobial stewardship pharmacist: [facility] [x]  Eldridge Scot, Pharm.D. []  Celedonio Miyamoto, Pharm.D., BCPS AQ-ID []  Garvin Fila, Pharm.D., BCPS []  Georgina Pillion, Pharm.D., BCPS []  [place], [address].D., BCPS, AAHIVP []  Estella Husk, Pharm.D., BCPS, AAHIVP []  Lysle Pearl, PharmD, BCPS []  Phillips Climes, PharmD, BCPS []  Agapito Games, PharmD, BCPS []  Verlan Friends, PharmD []  Mervyn Gay, PharmD, BCPS []  Vinnie Level, PharmD  [facility] []  Len Childs, PharmD []  Greer Pickerel, PharmD []  Adalberto Cole, PharmD []  Perlie Gold, Rph []  Lonell Face) Jean Rosenthal, PharmD []  Earl Many, PharmD []  Junita Push, PharmD []  Dorna Leitz, PharmD []  Terrilee Files, PharmD []  Lynann Beaver, PharmD []  Keturah Barre, PharmD []  Loralee Pacas, PharmD []  Bernadene Person, PharmD   Positive urine culture Tx to [facility], medical team aware of culture and no further patient follow-up is required at this time.  Virl Axe [facility] 09/17/2022, 10:18 AM

## 2022-09-19 DIAGNOSIS — N200 Calculus of kidney: Secondary | ICD-10-CM | POA: Diagnosis not present

## 2022-09-20 DIAGNOSIS — N200 Calculus of kidney: Secondary | ICD-10-CM | POA: Diagnosis not present

## 2022-09-20 DIAGNOSIS — N390 Urinary tract infection, site not specified: Secondary | ICD-10-CM | POA: Diagnosis not present

## 2022-09-20 DIAGNOSIS — R7881 Bacteremia: Secondary | ICD-10-CM | POA: Diagnosis not present

## 2022-09-23 DIAGNOSIS — Z940 Kidney transplant status: Secondary | ICD-10-CM | POA: Diagnosis not present

## 2022-09-23 DIAGNOSIS — D702 Other drug-induced agranulocytosis: Secondary | ICD-10-CM | POA: Diagnosis not present

## 2022-09-24 DIAGNOSIS — R7881 Bacteremia: Secondary | ICD-10-CM | POA: Diagnosis not present

## 2022-09-24 DIAGNOSIS — Z436 Encounter for attention to other artificial openings of urinary tract: Secondary | ICD-10-CM | POA: Diagnosis not present

## 2022-09-24 DIAGNOSIS — I2510 Atherosclerotic heart disease of native coronary artery without angina pectoris: Secondary | ICD-10-CM | POA: Diagnosis not present

## 2022-09-24 DIAGNOSIS — Z940 Kidney transplant status: Secondary | ICD-10-CM | POA: Diagnosis not present

## 2022-09-24 DIAGNOSIS — M1A09X0 Idiopathic chronic gout, multiple sites, without tophus (tophi): Secondary | ICD-10-CM | POA: Diagnosis not present

## 2022-09-24 DIAGNOSIS — Z7951 Long term (current) use of inhaled steroids: Secondary | ICD-10-CM | POA: Diagnosis not present

## 2022-09-24 DIAGNOSIS — D849 Immunodeficiency, unspecified: Secondary | ICD-10-CM | POA: Diagnosis not present

## 2022-09-24 DIAGNOSIS — N136 Pyonephrosis: Secondary | ICD-10-CM | POA: Diagnosis not present

## 2022-09-24 DIAGNOSIS — N390 Urinary tract infection, site not specified: Secondary | ICD-10-CM | POA: Diagnosis not present

## 2022-09-24 DIAGNOSIS — I10 Essential (primary) hypertension: Secondary | ICD-10-CM | POA: Diagnosis not present

## 2022-09-24 DIAGNOSIS — B9561 Methicillin susceptible Staphylococcus aureus infection as the cause of diseases classified elsewhere: Secondary | ICD-10-CM | POA: Diagnosis not present

## 2022-09-24 DIAGNOSIS — Z792 Long term (current) use of antibiotics: Secondary | ICD-10-CM | POA: Diagnosis not present

## 2022-09-24 DIAGNOSIS — Z452 Encounter for adjustment and management of vascular access device: Secondary | ICD-10-CM | POA: Diagnosis not present

## 2022-09-24 DIAGNOSIS — B258 Other cytomegaloviral diseases: Secondary | ICD-10-CM | POA: Diagnosis not present

## 2022-09-24 DIAGNOSIS — Z7952 Long term (current) use of systemic steroids: Secondary | ICD-10-CM | POA: Diagnosis not present

## 2022-09-24 DIAGNOSIS — Z960 Presence of urogenital implants: Secondary | ICD-10-CM | POA: Diagnosis not present

## 2022-09-30 DIAGNOSIS — I10 Essential (primary) hypertension: Secondary | ICD-10-CM | POA: Diagnosis not present

## 2022-09-30 DIAGNOSIS — E039 Hypothyroidism, unspecified: Secondary | ICD-10-CM | POA: Diagnosis not present

## 2022-09-30 DIAGNOSIS — I110 Hypertensive heart disease with heart failure: Secondary | ICD-10-CM | POA: Diagnosis not present

## 2022-09-30 DIAGNOSIS — I5022 Chronic systolic (congestive) heart failure: Secondary | ICD-10-CM | POA: Diagnosis not present

## 2022-09-30 DIAGNOSIS — N201 Calculus of ureter: Secondary | ICD-10-CM | POA: Diagnosis not present

## 2022-09-30 DIAGNOSIS — T83512D Infection and inflammatory reaction due to nephrostomy catheter, subsequent encounter: Secondary | ICD-10-CM | POA: Diagnosis not present

## 2022-09-30 DIAGNOSIS — N390 Urinary tract infection, site not specified: Secondary | ICD-10-CM | POA: Diagnosis not present

## 2022-09-30 DIAGNOSIS — I330 Acute and subacute infective endocarditis: Secondary | ICD-10-CM | POA: Diagnosis not present

## 2022-09-30 DIAGNOSIS — N342 Other urethritis: Secondary | ICD-10-CM | POA: Diagnosis not present

## 2022-09-30 DIAGNOSIS — Z940 Kidney transplant status: Secondary | ICD-10-CM | POA: Diagnosis not present

## 2022-09-30 DIAGNOSIS — Z792 Long term (current) use of antibiotics: Secondary | ICD-10-CM | POA: Diagnosis not present

## 2022-09-30 DIAGNOSIS — Z09 Encounter for follow-up examination after completed treatment for conditions other than malignant neoplasm: Secondary | ICD-10-CM | POA: Diagnosis not present

## 2022-09-30 DIAGNOSIS — B9561 Methicillin susceptible Staphylococcus aureus infection as the cause of diseases classified elsewhere: Secondary | ICD-10-CM | POA: Diagnosis not present

## 2022-10-01 DIAGNOSIS — J45909 Unspecified asthma, uncomplicated: Secondary | ICD-10-CM | POA: Diagnosis not present

## 2022-10-01 DIAGNOSIS — N186 End stage renal disease: Secondary | ICD-10-CM | POA: Diagnosis not present

## 2022-10-01 DIAGNOSIS — G4733 Obstructive sleep apnea (adult) (pediatric): Secondary | ICD-10-CM | POA: Diagnosis not present

## 2022-10-01 DIAGNOSIS — E785 Hyperlipidemia, unspecified: Secondary | ICD-10-CM | POA: Diagnosis not present

## 2022-10-01 DIAGNOSIS — I120 Hypertensive chronic kidney disease with stage 5 chronic kidney disease or end stage renal disease: Secondary | ICD-10-CM | POA: Diagnosis not present

## 2022-10-01 DIAGNOSIS — D649 Anemia, unspecified: Secondary | ICD-10-CM | POA: Diagnosis not present

## 2022-10-01 DIAGNOSIS — K219 Gastro-esophageal reflux disease without esophagitis: Secondary | ICD-10-CM | POA: Diagnosis not present

## 2022-10-02 DIAGNOSIS — N200 Calculus of kidney: Secondary | ICD-10-CM | POA: Diagnosis not present

## 2022-10-02 DIAGNOSIS — Z940 Kidney transplant status: Secondary | ICD-10-CM | POA: Diagnosis not present

## 2022-10-06 DIAGNOSIS — Z7952 Long term (current) use of systemic steroids: Secondary | ICD-10-CM | POA: Diagnosis not present

## 2022-10-06 DIAGNOSIS — R7881 Bacteremia: Secondary | ICD-10-CM | POA: Diagnosis not present

## 2022-10-06 DIAGNOSIS — N136 Pyonephrosis: Secondary | ICD-10-CM | POA: Diagnosis not present

## 2022-10-06 DIAGNOSIS — I2510 Atherosclerotic heart disease of native coronary artery without angina pectoris: Secondary | ICD-10-CM | POA: Diagnosis not present

## 2022-10-06 DIAGNOSIS — Z452 Encounter for adjustment and management of vascular access device: Secondary | ICD-10-CM | POA: Diagnosis not present

## 2022-10-06 DIAGNOSIS — Z940 Kidney transplant status: Secondary | ICD-10-CM | POA: Diagnosis not present

## 2022-10-06 DIAGNOSIS — Z7951 Long term (current) use of inhaled steroids: Secondary | ICD-10-CM | POA: Diagnosis not present

## 2022-10-06 DIAGNOSIS — Z792 Long term (current) use of antibiotics: Secondary | ICD-10-CM | POA: Diagnosis not present

## 2022-10-06 DIAGNOSIS — Z960 Presence of urogenital implants: Secondary | ICD-10-CM | POA: Diagnosis not present

## 2022-10-06 DIAGNOSIS — I10 Essential (primary) hypertension: Secondary | ICD-10-CM | POA: Diagnosis not present

## 2022-10-06 DIAGNOSIS — N390 Urinary tract infection, site not specified: Secondary | ICD-10-CM | POA: Diagnosis not present

## 2022-10-06 DIAGNOSIS — B9561 Methicillin susceptible Staphylococcus aureus infection as the cause of diseases classified elsewhere: Secondary | ICD-10-CM | POA: Diagnosis not present

## 2022-10-06 DIAGNOSIS — D849 Immunodeficiency, unspecified: Secondary | ICD-10-CM | POA: Diagnosis not present

## 2022-10-06 DIAGNOSIS — M1A09X0 Idiopathic chronic gout, multiple sites, without tophus (tophi): Secondary | ICD-10-CM | POA: Diagnosis not present

## 2022-10-06 DIAGNOSIS — B258 Other cytomegaloviral diseases: Secondary | ICD-10-CM | POA: Diagnosis not present

## 2022-10-06 DIAGNOSIS — N200 Calculus of kidney: Secondary | ICD-10-CM | POA: Diagnosis not present

## 2022-10-06 DIAGNOSIS — Z436 Encounter for attention to other artificial openings of urinary tract: Secondary | ICD-10-CM | POA: Diagnosis not present

## 2022-10-08 DIAGNOSIS — R7881 Bacteremia: Secondary | ICD-10-CM | POA: Diagnosis not present

## 2022-10-08 DIAGNOSIS — N390 Urinary tract infection, site not specified: Secondary | ICD-10-CM | POA: Diagnosis not present

## 2022-10-08 DIAGNOSIS — N200 Calculus of kidney: Secondary | ICD-10-CM | POA: Diagnosis not present

## 2022-10-09 DIAGNOSIS — E785 Hyperlipidemia, unspecified: Secondary | ICD-10-CM | POA: Diagnosis not present

## 2022-10-09 DIAGNOSIS — Z79899 Other long term (current) drug therapy: Secondary | ICD-10-CM | POA: Diagnosis not present

## 2022-10-09 DIAGNOSIS — I10 Essential (primary) hypertension: Secondary | ICD-10-CM | POA: Diagnosis not present

## 2022-10-09 DIAGNOSIS — I340 Nonrheumatic mitral (valve) insufficiency: Secondary | ICD-10-CM | POA: Diagnosis not present

## 2022-10-09 DIAGNOSIS — Z91199 Patient's noncompliance with other medical treatment and regimen due to unspecified reason: Secondary | ICD-10-CM | POA: Diagnosis not present

## 2022-10-09 DIAGNOSIS — G4733 Obstructive sleep apnea (adult) (pediatric): Secondary | ICD-10-CM | POA: Diagnosis not present

## 2022-10-09 DIAGNOSIS — J45909 Unspecified asthma, uncomplicated: Secondary | ICD-10-CM | POA: Diagnosis not present

## 2022-10-09 DIAGNOSIS — Z940 Kidney transplant status: Secondary | ICD-10-CM | POA: Diagnosis not present

## 2022-10-09 DIAGNOSIS — I440 Atrioventricular block, first degree: Secondary | ICD-10-CM | POA: Diagnosis not present

## 2022-10-09 DIAGNOSIS — K219 Gastro-esophageal reflux disease without esophagitis: Secondary | ICD-10-CM | POA: Diagnosis not present

## 2022-10-09 DIAGNOSIS — J4520 Mild intermittent asthma, uncomplicated: Secondary | ICD-10-CM | POA: Diagnosis not present

## 2022-10-09 DIAGNOSIS — N201 Calculus of ureter: Secondary | ICD-10-CM | POA: Diagnosis not present

## 2022-10-09 DIAGNOSIS — Z7982 Long term (current) use of aspirin: Secondary | ICD-10-CM | POA: Diagnosis not present

## 2022-10-14 DIAGNOSIS — D849 Immunodeficiency, unspecified: Secondary | ICD-10-CM | POA: Diagnosis not present

## 2022-10-14 DIAGNOSIS — M1A09X0 Idiopathic chronic gout, multiple sites, without tophus (tophi): Secondary | ICD-10-CM | POA: Diagnosis not present

## 2022-10-14 DIAGNOSIS — Z436 Encounter for attention to other artificial openings of urinary tract: Secondary | ICD-10-CM | POA: Diagnosis not present

## 2022-10-14 DIAGNOSIS — Z792 Long term (current) use of antibiotics: Secondary | ICD-10-CM | POA: Diagnosis not present

## 2022-10-14 DIAGNOSIS — Z452 Encounter for adjustment and management of vascular access device: Secondary | ICD-10-CM | POA: Diagnosis not present

## 2022-10-14 DIAGNOSIS — Z4822 Encounter for aftercare following kidney transplant: Secondary | ICD-10-CM | POA: Diagnosis not present

## 2022-10-14 DIAGNOSIS — Z5181 Encounter for therapeutic drug level monitoring: Secondary | ICD-10-CM | POA: Diagnosis not present

## 2022-10-14 DIAGNOSIS — N390 Urinary tract infection, site not specified: Secondary | ICD-10-CM | POA: Diagnosis not present

## 2022-10-14 DIAGNOSIS — Z7951 Long term (current) use of inhaled steroids: Secondary | ICD-10-CM | POA: Diagnosis not present

## 2022-10-14 DIAGNOSIS — B9561 Methicillin susceptible Staphylococcus aureus infection as the cause of diseases classified elsewhere: Secondary | ICD-10-CM | POA: Diagnosis not present

## 2022-10-14 DIAGNOSIS — Z79899 Other long term (current) drug therapy: Secondary | ICD-10-CM | POA: Diagnosis not present

## 2022-10-14 DIAGNOSIS — Z7952 Long term (current) use of systemic steroids: Secondary | ICD-10-CM | POA: Diagnosis not present

## 2022-10-14 DIAGNOSIS — R7881 Bacteremia: Secondary | ICD-10-CM | POA: Diagnosis not present

## 2022-10-14 DIAGNOSIS — Z940 Kidney transplant status: Secondary | ICD-10-CM | POA: Diagnosis not present

## 2022-10-14 DIAGNOSIS — N136 Pyonephrosis: Secondary | ICD-10-CM | POA: Diagnosis not present

## 2022-10-14 DIAGNOSIS — I2510 Atherosclerotic heart disease of native coronary artery without angina pectoris: Secondary | ICD-10-CM | POA: Diagnosis not present

## 2022-10-14 DIAGNOSIS — Z960 Presence of urogenital implants: Secondary | ICD-10-CM | POA: Diagnosis not present

## 2022-10-14 DIAGNOSIS — I10 Essential (primary) hypertension: Secondary | ICD-10-CM | POA: Diagnosis not present

## 2022-10-14 DIAGNOSIS — D84821 Immunodeficiency due to drugs: Secondary | ICD-10-CM | POA: Diagnosis not present

## 2022-10-14 DIAGNOSIS — Z79621 Long term (current) use of calcineurin inhibitor: Secondary | ICD-10-CM | POA: Diagnosis not present

## 2022-10-14 DIAGNOSIS — B258 Other cytomegaloviral diseases: Secondary | ICD-10-CM | POA: Diagnosis not present

## 2022-10-15 DIAGNOSIS — Z960 Presence of urogenital implants: Secondary | ICD-10-CM | POA: Diagnosis not present

## 2022-10-20 DIAGNOSIS — N136 Pyonephrosis: Secondary | ICD-10-CM | POA: Diagnosis not present

## 2022-10-20 DIAGNOSIS — B9561 Methicillin susceptible Staphylococcus aureus infection as the cause of diseases classified elsewhere: Secondary | ICD-10-CM | POA: Diagnosis not present

## 2022-10-20 DIAGNOSIS — I2510 Atherosclerotic heart disease of native coronary artery without angina pectoris: Secondary | ICD-10-CM | POA: Diagnosis not present

## 2022-10-20 DIAGNOSIS — Z7951 Long term (current) use of inhaled steroids: Secondary | ICD-10-CM | POA: Diagnosis not present

## 2022-10-20 DIAGNOSIS — Z452 Encounter for adjustment and management of vascular access device: Secondary | ICD-10-CM | POA: Diagnosis not present

## 2022-10-20 DIAGNOSIS — B258 Other cytomegaloviral diseases: Secondary | ICD-10-CM | POA: Diagnosis not present

## 2022-10-20 DIAGNOSIS — Z940 Kidney transplant status: Secondary | ICD-10-CM | POA: Diagnosis not present

## 2022-10-20 DIAGNOSIS — R7881 Bacteremia: Secondary | ICD-10-CM | POA: Diagnosis not present

## 2022-10-20 DIAGNOSIS — I10 Essential (primary) hypertension: Secondary | ICD-10-CM | POA: Diagnosis not present

## 2022-10-20 DIAGNOSIS — Z960 Presence of urogenital implants: Secondary | ICD-10-CM | POA: Diagnosis not present

## 2022-10-20 DIAGNOSIS — N390 Urinary tract infection, site not specified: Secondary | ICD-10-CM | POA: Diagnosis not present

## 2022-10-20 DIAGNOSIS — M1A09X0 Idiopathic chronic gout, multiple sites, without tophus (tophi): Secondary | ICD-10-CM | POA: Diagnosis not present

## 2022-10-20 DIAGNOSIS — Z436 Encounter for attention to other artificial openings of urinary tract: Secondary | ICD-10-CM | POA: Diagnosis not present

## 2022-10-20 DIAGNOSIS — Z7952 Long term (current) use of systemic steroids: Secondary | ICD-10-CM | POA: Diagnosis not present

## 2022-10-20 DIAGNOSIS — Z792 Long term (current) use of antibiotics: Secondary | ICD-10-CM | POA: Diagnosis not present

## 2022-10-20 DIAGNOSIS — D849 Immunodeficiency, unspecified: Secondary | ICD-10-CM | POA: Diagnosis not present

## 2022-10-27 DIAGNOSIS — N390 Urinary tract infection, site not specified: Secondary | ICD-10-CM | POA: Diagnosis not present

## 2022-10-27 DIAGNOSIS — Z436 Encounter for attention to other artificial openings of urinary tract: Secondary | ICD-10-CM | POA: Diagnosis not present

## 2022-10-27 DIAGNOSIS — Z960 Presence of urogenital implants: Secondary | ICD-10-CM | POA: Diagnosis not present

## 2022-10-27 DIAGNOSIS — Z452 Encounter for adjustment and management of vascular access device: Secondary | ICD-10-CM | POA: Diagnosis not present

## 2022-10-27 DIAGNOSIS — Z7951 Long term (current) use of inhaled steroids: Secondary | ICD-10-CM | POA: Diagnosis not present

## 2022-10-27 DIAGNOSIS — N136 Pyonephrosis: Secondary | ICD-10-CM | POA: Diagnosis not present

## 2022-10-27 DIAGNOSIS — Z792 Long term (current) use of antibiotics: Secondary | ICD-10-CM | POA: Diagnosis not present

## 2022-10-27 DIAGNOSIS — I10 Essential (primary) hypertension: Secondary | ICD-10-CM | POA: Diagnosis not present

## 2022-10-27 DIAGNOSIS — I2510 Atherosclerotic heart disease of native coronary artery without angina pectoris: Secondary | ICD-10-CM | POA: Diagnosis not present

## 2022-10-27 DIAGNOSIS — R7881 Bacteremia: Secondary | ICD-10-CM | POA: Diagnosis not present

## 2022-10-27 DIAGNOSIS — Z940 Kidney transplant status: Secondary | ICD-10-CM | POA: Diagnosis not present

## 2022-10-27 DIAGNOSIS — Z7952 Long term (current) use of systemic steroids: Secondary | ICD-10-CM | POA: Diagnosis not present

## 2022-10-27 DIAGNOSIS — B9561 Methicillin susceptible Staphylococcus aureus infection as the cause of diseases classified elsewhere: Secondary | ICD-10-CM | POA: Diagnosis not present

## 2022-10-27 DIAGNOSIS — D849 Immunodeficiency, unspecified: Secondary | ICD-10-CM | POA: Diagnosis not present

## 2022-10-27 DIAGNOSIS — B258 Other cytomegaloviral diseases: Secondary | ICD-10-CM | POA: Diagnosis not present

## 2022-10-27 DIAGNOSIS — N200 Calculus of kidney: Secondary | ICD-10-CM | POA: Diagnosis not present

## 2022-10-27 DIAGNOSIS — M1A09X0 Idiopathic chronic gout, multiple sites, without tophus (tophi): Secondary | ICD-10-CM | POA: Diagnosis not present

## 2022-10-28 DIAGNOSIS — B9561 Methicillin susceptible Staphylococcus aureus infection as the cause of diseases classified elsewhere: Secondary | ICD-10-CM | POA: Diagnosis not present

## 2022-10-28 DIAGNOSIS — Z792 Long term (current) use of antibiotics: Secondary | ICD-10-CM | POA: Diagnosis not present

## 2022-10-28 DIAGNOSIS — I330 Acute and subacute infective endocarditis: Secondary | ICD-10-CM | POA: Diagnosis not present

## 2022-10-28 DIAGNOSIS — Z940 Kidney transplant status: Secondary | ICD-10-CM | POA: Diagnosis not present

## 2022-10-28 DIAGNOSIS — Z87440 Personal history of urinary (tract) infections: Secondary | ICD-10-CM | POA: Diagnosis not present

## 2022-10-28 DIAGNOSIS — D849 Immunodeficiency, unspecified: Secondary | ICD-10-CM | POA: Diagnosis not present

## 2022-10-30 DIAGNOSIS — E1122 Type 2 diabetes mellitus with diabetic chronic kidney disease: Secondary | ICD-10-CM | POA: Diagnosis not present

## 2022-10-30 DIAGNOSIS — I770 Arteriovenous fistula, acquired: Secondary | ICD-10-CM | POA: Diagnosis not present

## 2022-10-30 DIAGNOSIS — D849 Immunodeficiency, unspecified: Secondary | ICD-10-CM | POA: Diagnosis not present

## 2022-10-30 DIAGNOSIS — Z940 Kidney transplant status: Secondary | ICD-10-CM | POA: Diagnosis not present

## 2022-10-30 DIAGNOSIS — I129 Hypertensive chronic kidney disease with stage 1 through stage 4 chronic kidney disease, or unspecified chronic kidney disease: Secondary | ICD-10-CM | POA: Diagnosis not present

## 2022-11-04 DIAGNOSIS — Z452 Encounter for adjustment and management of vascular access device: Secondary | ICD-10-CM | POA: Diagnosis not present

## 2022-11-04 DIAGNOSIS — Z87440 Personal history of urinary (tract) infections: Secondary | ICD-10-CM | POA: Diagnosis not present

## 2022-11-13 DIAGNOSIS — Z87440 Personal history of urinary (tract) infections: Secondary | ICD-10-CM | POA: Diagnosis not present

## 2022-11-13 DIAGNOSIS — B9561 Methicillin susceptible Staphylococcus aureus infection as the cause of diseases classified elsewhere: Secondary | ICD-10-CM | POA: Diagnosis not present

## 2022-11-13 DIAGNOSIS — D849 Immunodeficiency, unspecified: Secondary | ICD-10-CM | POA: Diagnosis not present

## 2022-11-13 DIAGNOSIS — I330 Acute and subacute infective endocarditis: Secondary | ICD-10-CM | POA: Diagnosis not present

## 2022-11-13 DIAGNOSIS — Z940 Kidney transplant status: Secondary | ICD-10-CM | POA: Diagnosis not present

## 2022-11-17 DIAGNOSIS — Z7952 Long term (current) use of systemic steroids: Secondary | ICD-10-CM | POA: Diagnosis not present

## 2022-11-17 DIAGNOSIS — R7881 Bacteremia: Secondary | ICD-10-CM | POA: Diagnosis not present

## 2022-11-17 DIAGNOSIS — N136 Pyonephrosis: Secondary | ICD-10-CM | POA: Diagnosis not present

## 2022-11-17 DIAGNOSIS — Z792 Long term (current) use of antibiotics: Secondary | ICD-10-CM | POA: Diagnosis not present

## 2022-11-17 DIAGNOSIS — B258 Other cytomegaloviral diseases: Secondary | ICD-10-CM | POA: Diagnosis not present

## 2022-11-17 DIAGNOSIS — N390 Urinary tract infection, site not specified: Secondary | ICD-10-CM | POA: Diagnosis not present

## 2022-11-17 DIAGNOSIS — D849 Immunodeficiency, unspecified: Secondary | ICD-10-CM | POA: Diagnosis not present

## 2022-11-17 DIAGNOSIS — Z452 Encounter for adjustment and management of vascular access device: Secondary | ICD-10-CM | POA: Diagnosis not present

## 2022-11-17 DIAGNOSIS — B9561 Methicillin susceptible Staphylococcus aureus infection as the cause of diseases classified elsewhere: Secondary | ICD-10-CM | POA: Diagnosis not present

## 2022-11-17 DIAGNOSIS — I10 Essential (primary) hypertension: Secondary | ICD-10-CM | POA: Diagnosis not present

## 2022-11-17 DIAGNOSIS — I2510 Atherosclerotic heart disease of native coronary artery without angina pectoris: Secondary | ICD-10-CM | POA: Diagnosis not present

## 2022-11-17 DIAGNOSIS — M1A09X0 Idiopathic chronic gout, multiple sites, without tophus (tophi): Secondary | ICD-10-CM | POA: Diagnosis not present

## 2022-11-17 DIAGNOSIS — Z960 Presence of urogenital implants: Secondary | ICD-10-CM | POA: Diagnosis not present

## 2022-11-17 DIAGNOSIS — Z436 Encounter for attention to other artificial openings of urinary tract: Secondary | ICD-10-CM | POA: Diagnosis not present

## 2022-11-17 DIAGNOSIS — Z940 Kidney transplant status: Secondary | ICD-10-CM | POA: Diagnosis not present

## 2022-11-17 DIAGNOSIS — Z7951 Long term (current) use of inhaled steroids: Secondary | ICD-10-CM | POA: Diagnosis not present

## 2022-12-03 DIAGNOSIS — Z940 Kidney transplant status: Secondary | ICD-10-CM | POA: Diagnosis not present

## 2022-12-03 DIAGNOSIS — Z4822 Encounter for aftercare following kidney transplant: Secondary | ICD-10-CM | POA: Diagnosis not present

## 2022-12-03 DIAGNOSIS — K219 Gastro-esophageal reflux disease without esophagitis: Secondary | ICD-10-CM | POA: Diagnosis not present

## 2022-12-03 DIAGNOSIS — Z79621 Long term (current) use of calcineurin inhibitor: Secondary | ICD-10-CM | POA: Diagnosis not present

## 2022-12-03 DIAGNOSIS — D849 Immunodeficiency, unspecified: Secondary | ICD-10-CM | POA: Diagnosis not present

## 2022-12-03 DIAGNOSIS — Z5181 Encounter for therapeutic drug level monitoring: Secondary | ICD-10-CM | POA: Diagnosis not present

## 2022-12-03 DIAGNOSIS — I10 Essential (primary) hypertension: Secondary | ICD-10-CM | POA: Diagnosis not present

## 2022-12-03 DIAGNOSIS — J4520 Mild intermittent asthma, uncomplicated: Secondary | ICD-10-CM | POA: Diagnosis not present

## 2023-02-03 DIAGNOSIS — Q141 Congenital malformation of retina: Secondary | ICD-10-CM | POA: Diagnosis not present

## 2023-02-03 DIAGNOSIS — H43811 Vitreous degeneration, right eye: Secondary | ICD-10-CM | POA: Diagnosis not present

## 2023-02-03 DIAGNOSIS — H35033 Hypertensive retinopathy, bilateral: Secondary | ICD-10-CM | POA: Diagnosis not present

## 2023-02-03 DIAGNOSIS — H2513 Age-related nuclear cataract, bilateral: Secondary | ICD-10-CM | POA: Diagnosis not present

## 2023-02-16 DIAGNOSIS — Z940 Kidney transplant status: Secondary | ICD-10-CM | POA: Diagnosis not present

## 2023-02-16 DIAGNOSIS — R82998 Other abnormal findings in urine: Secondary | ICD-10-CM | POA: Diagnosis not present

## 2023-02-16 DIAGNOSIS — Z960 Presence of urogenital implants: Secondary | ICD-10-CM | POA: Diagnosis not present

## 2023-02-16 DIAGNOSIS — N200 Calculus of kidney: Secondary | ICD-10-CM | POA: Diagnosis not present

## 2023-03-09 DIAGNOSIS — Z4822 Encounter for aftercare following kidney transplant: Secondary | ICD-10-CM | POA: Diagnosis not present

## 2023-03-09 DIAGNOSIS — Z940 Kidney transplant status: Secondary | ICD-10-CM | POA: Diagnosis not present

## 2023-03-09 DIAGNOSIS — N201 Calculus of ureter: Secondary | ICD-10-CM | POA: Diagnosis not present

## 2023-03-09 DIAGNOSIS — Z5181 Encounter for therapeutic drug level monitoring: Secondary | ICD-10-CM | POA: Diagnosis not present

## 2023-03-09 DIAGNOSIS — D849 Immunodeficiency, unspecified: Secondary | ICD-10-CM | POA: Diagnosis not present

## 2023-03-09 DIAGNOSIS — Z79899 Other long term (current) drug therapy: Secondary | ICD-10-CM | POA: Diagnosis not present

## 2023-03-09 DIAGNOSIS — Z79621 Long term (current) use of calcineurin inhibitor: Secondary | ICD-10-CM | POA: Diagnosis not present

## 2023-03-09 DIAGNOSIS — I10 Essential (primary) hypertension: Secondary | ICD-10-CM | POA: Diagnosis not present

## 2023-05-06 DIAGNOSIS — E1122 Type 2 diabetes mellitus with diabetic chronic kidney disease: Secondary | ICD-10-CM | POA: Diagnosis not present

## 2023-05-06 DIAGNOSIS — I129 Hypertensive chronic kidney disease with stage 1 through stage 4 chronic kidney disease, or unspecified chronic kidney disease: Secondary | ICD-10-CM | POA: Diagnosis not present

## 2023-05-06 DIAGNOSIS — Z940 Kidney transplant status: Secondary | ICD-10-CM | POA: Diagnosis not present

## 2023-05-06 DIAGNOSIS — I770 Arteriovenous fistula, acquired: Secondary | ICD-10-CM | POA: Diagnosis not present

## 2023-05-06 DIAGNOSIS — D849 Immunodeficiency, unspecified: Secondary | ICD-10-CM | POA: Diagnosis not present

## 2023-05-25 DIAGNOSIS — Z940 Kidney transplant status: Secondary | ICD-10-CM | POA: Diagnosis not present

## 2023-05-25 DIAGNOSIS — E1122 Type 2 diabetes mellitus with diabetic chronic kidney disease: Secondary | ICD-10-CM | POA: Diagnosis not present

## 2023-05-25 DIAGNOSIS — I129 Hypertensive chronic kidney disease with stage 1 through stage 4 chronic kidney disease, or unspecified chronic kidney disease: Secondary | ICD-10-CM | POA: Diagnosis not present

## 2023-09-10 DIAGNOSIS — I129 Hypertensive chronic kidney disease with stage 1 through stage 4 chronic kidney disease, or unspecified chronic kidney disease: Secondary | ICD-10-CM | POA: Diagnosis not present

## 2023-09-10 DIAGNOSIS — E1122 Type 2 diabetes mellitus with diabetic chronic kidney disease: Secondary | ICD-10-CM | POA: Diagnosis not present

## 2023-09-10 DIAGNOSIS — Z940 Kidney transplant status: Secondary | ICD-10-CM | POA: Diagnosis not present

## 2023-09-10 DIAGNOSIS — D849 Immunodeficiency, unspecified: Secondary | ICD-10-CM | POA: Diagnosis not present
